# Patient Record
Sex: Male | Born: 1950
Health system: Southern US, Community
[De-identification: ages and names within clinical notes are randomized; demographics above are authoritative.]

## PROBLEM LIST (undated history)

## (undated) DIAGNOSIS — R05 Cough: Secondary | ICD-10-CM

## (undated) DIAGNOSIS — M79662 Pain in left lower leg: Secondary | ICD-10-CM

## (undated) DIAGNOSIS — E119 Type 2 diabetes mellitus without complications: Secondary | ICD-10-CM

## (undated) DIAGNOSIS — C9000 Multiple myeloma not having achieved remission: Principal | ICD-10-CM

## (undated) DIAGNOSIS — C801 Malignant (primary) neoplasm, unspecified: Secondary | ICD-10-CM

## (undated) DIAGNOSIS — K219 Gastro-esophageal reflux disease without esophagitis: Secondary | ICD-10-CM

## (undated) DIAGNOSIS — M79661 Pain in right lower leg: Secondary | ICD-10-CM

## (undated) HISTORY — DX: Pain in right lower leg: M79.661

## (undated) HISTORY — DX: Cough: R05

## (undated) HISTORY — DX: Multiple myeloma not having achieved remission: C90.00

## (undated) HISTORY — DX: Pain in left lower leg: M79.662

## (undated) HISTORY — PX: ORIF ULNAR FRACTURE: SHX5417

## (undated) HISTORY — PX: OTHER SURGICAL HISTORY: SHX169

---

## 1997-11-23 ENCOUNTER — Ambulatory Visit (HOSPITAL_COMMUNITY): Admission: RE | Admit: 1997-11-23 | Discharge: 1997-11-23 | Payer: Self-pay | Admitting: Gastroenterology

## 1998-02-15 ENCOUNTER — Emergency Department (HOSPITAL_COMMUNITY): Admission: EM | Admit: 1998-02-15 | Discharge: 1998-02-15 | Payer: Self-pay | Admitting: Emergency Medicine

## 2000-11-29 ENCOUNTER — Ambulatory Visit (HOSPITAL_COMMUNITY): Admission: RE | Admit: 2000-11-29 | Discharge: 2000-11-29 | Payer: Self-pay | Admitting: Gastroenterology

## 2000-11-29 ENCOUNTER — Encounter (INDEPENDENT_AMBULATORY_CARE_PROVIDER_SITE_OTHER): Payer: Self-pay | Admitting: *Deleted

## 2002-03-23 DIAGNOSIS — C801 Malignant (primary) neoplasm, unspecified: Secondary | ICD-10-CM

## 2002-03-23 HISTORY — DX: Malignant (primary) neoplasm, unspecified: C80.1

## 2002-03-23 HISTORY — PX: TESTICLE REMOVAL: SHX68

## 2003-01-10 ENCOUNTER — Encounter (INDEPENDENT_AMBULATORY_CARE_PROVIDER_SITE_OTHER): Payer: Self-pay | Admitting: Specialist

## 2003-01-10 ENCOUNTER — Ambulatory Visit (HOSPITAL_COMMUNITY): Admission: RE | Admit: 2003-01-10 | Discharge: 2003-01-10 | Payer: Self-pay | Admitting: Urology

## 2003-01-29 ENCOUNTER — Ambulatory Visit: Admission: RE | Admit: 2003-01-29 | Discharge: 2003-03-08 | Payer: Self-pay | Admitting: Radiation Oncology

## 2003-04-05 ENCOUNTER — Ambulatory Visit: Admission: RE | Admit: 2003-04-05 | Discharge: 2003-04-05 | Payer: Self-pay | Admitting: Radiation Oncology

## 2003-04-25 ENCOUNTER — Ambulatory Visit (HOSPITAL_COMMUNITY): Admission: RE | Admit: 2003-04-25 | Discharge: 2003-04-25 | Payer: Self-pay | Admitting: Urology

## 2003-08-07 ENCOUNTER — Ambulatory Visit (HOSPITAL_COMMUNITY): Admission: RE | Admit: 2003-08-07 | Discharge: 2003-08-07 | Payer: Self-pay | Admitting: Urology

## 2003-09-19 ENCOUNTER — Ambulatory Visit (HOSPITAL_COMMUNITY): Admission: RE | Admit: 2003-09-19 | Discharge: 2003-09-19 | Payer: Self-pay | Admitting: Gastroenterology

## 2003-09-19 ENCOUNTER — Encounter (INDEPENDENT_AMBULATORY_CARE_PROVIDER_SITE_OTHER): Payer: Self-pay | Admitting: Specialist

## 2003-12-11 ENCOUNTER — Ambulatory Visit (HOSPITAL_COMMUNITY): Admission: RE | Admit: 2003-12-11 | Discharge: 2003-12-11 | Payer: Self-pay | Admitting: Urology

## 2004-04-09 ENCOUNTER — Ambulatory Visit (HOSPITAL_COMMUNITY): Admission: RE | Admit: 2004-04-09 | Discharge: 2004-04-09 | Payer: Self-pay | Admitting: Urology

## 2005-01-22 ENCOUNTER — Ambulatory Visit (HOSPITAL_COMMUNITY): Admission: RE | Admit: 2005-01-22 | Discharge: 2005-01-22 | Payer: Self-pay | Admitting: Urology

## 2005-11-13 ENCOUNTER — Ambulatory Visit (HOSPITAL_COMMUNITY): Admission: RE | Admit: 2005-11-13 | Discharge: 2005-11-13 | Payer: Self-pay | Admitting: Urology

## 2006-08-06 ENCOUNTER — Ambulatory Visit: Admission: RE | Admit: 2006-08-06 | Discharge: 2006-08-06 | Payer: Self-pay | Admitting: Urology

## 2007-11-29 ENCOUNTER — Ambulatory Visit (HOSPITAL_COMMUNITY): Admission: RE | Admit: 2007-11-29 | Discharge: 2007-11-29 | Payer: Self-pay | Admitting: Urology

## 2010-08-08 NOTE — Op Note (Signed)
NAME:  Tyler Willis, Tyler Willis NO.:  [id number]   MEDICAL RECORD NO.:  [id number]                   PATIENT TYPE:  AMB   LOCATION:  DAY                                  FACILITY:  [facility]   PHYSICIAN:  Valetta Fuller, M.D.               DATE OF BIRTH:  1950/11/17   DATE OF PROCEDURE:  01/10/2003  DATE OF DISCHARGE:                                 OPERATIVE REPORT   PREOPERATIVE DIAGNOSIS:  Right testicular mass.   POSTOPERATIVE DIAGNOSIS:  Right testicular mass.   PROCEDURE:  Right inguinal exploration, right radical orchiectomy.   SURGEON:  Valetta Fuller, M.D.   ASSISTANT:  Susanne Borders, MD   ANESTHESIA:  General endotracheal plus local wound injection with 0.25%  Marcaine.   SPECIMENS:  Right testicle and right spermatic cord for pathologic analysis.   COMPLICATIONS:  None.   ESTIMATED BLOOD LOSS:  10 mL   DISPOSITION:  To post anesthesia care unit in stable condition.   INDICATIONS FOR PROCEDURE:  Tyler Willis is a 60 year old male who was seen  recently by Dr. Isabel Caprice for recent right testicular tenderness/firmness. The  patient reported no injury of trauma to that area. On physical exam, the  patient was found to have a very firm mass in his right testicle. 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 There was no normal testicular consistency as if the testicle had  been replaced entirely by this mass. The patient had tumor markers, AFP,  LFP, and beta HCG which were all within normal limits. Because of the nature  of this intratesticular lesion, the patient was counselled to undergo right  inguinal exploration with probable right radical orchiectomy. The patient  had consented to this after understanding the risks, benefits, and  alternatives.   DESCRIPTION OF PROCEDURE:  The patient was brought to the operating room,  correctly identified by his identification bracelet. He was given  preoperative antibiotics and general endotracheal anesthesia. He was placed  in supine position and shaved, prepped and draped in typical sterile  fashion. Approximately 6 cm inguinal incision was made with the scalpel. The  Bovie electrocautery was used to incise the dermis through the Camper's  fascia down through Scarpa's fascia. Blunt dissection was used to define the  shelving edge of the external oblique aponeuroses. A Kitner was used to free  the fatty tissue off of the external ring. The external ring was incised  approximately 4 cm exposing the entire length of the cord to the level of  the deep inguinal ring. The sides of the external oblique aponeurosis were  lifted and blunt dissection with the Kitner was used to free the spermatic  cord from the floor of the inguinal canal. The ilioinguinal nerve was  identified and was pushed away from the area  of dissection to avoid injury  to it. A right angle was passed around the spermatic cord prior to  manipulation of the testicle. A Penrose was doubly wrapped around the cord  and pulled taut and clamped with a hemostat. Blunt dissection was then used  with a sponge to free the gubernacular attachments of the testicle into the  scrotum. The testicle was pulled up into the inguinal wound without  difficulty. The scrotal dartos tissue was inverted and inspected for  bleeding and none was found. The spermatic cord was clamped twice with Kelly  clamps at the level of the deep inguinal ring. Another clamp was placed  distally and the spermatic cord was incised with Metzenbaum scissors and the  testicle was passed off the table. The clamp was then suture ligated with a  0 silk ligature so the Kelly clamp was flushed and no bleeding was seen.  Another free 2-0 silk tie was placed around the spermatic cord. The clamps  were removed and no bleeding was seen. The ties were purposely  kept long in  the event that the patient will need a retroperitoneal lymph node dissection  some time in the future. The wound was then copiously irrigated and further  inspected for sites of bleeding and none were found. The external oblique  aponeurosis was closed with a 2-0 Vicryl in running fashion. Again care was  taken not to injure the ilioinguinal nerve during closure or entrap it.  Scarpa's fascia was then approximated with a 3-0 Vicryl in a running  fashion. The skin was then closed with a running 4-0 Vicryl in a  subcuticular fashion. Benzoin and Steri-Strips were applied to the wound as  well as a gauze and Telfa. The patient tolerated the surgery well without  complications. He awakened easily from his anesthesia and was taken to the  post anesthesia care unit in stable condition. Please note that Dr. Isabel Caprice  was present and participated in all aspects of this case as he was the  primary surgeon.     Susanne Borders, MD                           Valetta Fuller, M.D.    DR/MEDQ  D:  01/10/2003  T:  01/10/2003  Job:  [id number]

## 2010-08-08 NOTE — Op Note (Signed)
NAME:  Tyler Willis, Tyler Willis NO.:  [id number]   MEDICAL RECORD NO.:  [id number]                   PATIENT TYPE:  AMB   LOCATION:  ENDO                                 FACILITY:  [facility]   PHYSICIAN:  Petra Kuba, M.D.                 DATE OF BIRTH:  11/16/1950   DATE OF PROCEDURE:  09/19/2003  DATE OF DISCHARGE:                                 OPERATIVE REPORT   PROCEDURE:  Esophagogastroduodenoscopy with biopsy.   INDICATION:  Episodic long-term upper tract symptoms.   Consent was signed prior to any pre-meds given.   ADDITIONAL MEDICINES FOR THIS PROCEDURE INCLUDED:  2.0 Versed only.   PROCEDURE:  The video endoscope inserted by direct vision.  The proximal mid  esophagus was normal.  In the distal esophagus there was a few scattered  tiny erosions which were cold biopsied at the end of the procedure.  The  scope passed through a small hiatal hernia into the stomach and advanced  through a normal antrum, normal pylorus into the duodenal bulb, pertinent  for some minimal bulbitis, and around the __________ normal second portion  of the duodenum.  The scope was withdrawn back to the bulb and a good look  there ruled out ulcer in that location.  The scope was withdrawn back to the  stomach and retroflexed.  Angularis, cardia and fundus were normal except  for the hiatal hernia being confirmed in the cardia.  Straight visualization  in the stomach did not reveal any additional findings.  The scope was then  slowly withdrawn.  The distal esophageal biopsies of the few tiny erosions  were obtained.  The scope was further withdrawn.  Mid and proximal esophagus  was normal.  The scope was removed.  Patient tolerated the procedure well.  There was no obvious immediate complication.   ENDOSCOPIC DIAGNOSES:  1. Small hiatal hernia.  2. A few distal esophageal erosions status post biopsy.  3. Minimal bulbitis.  4. Otherwise normal esophagogastroduodenoscopy.   PLAN:  1. Await pathology.  2. Okay for over-the-counter Prilosec use.  3. Pump inhibitor prescription p.r.n.  4. Happy to see back p.r.n., otherwise return care to Dr. Clarene Duke for the     customary health __________ to include yearly rectals and guaiacs.                                               Petra Kuba, M.D.    MEM/MEDQ  D:  09/19/2003  T:  09/19/2003  Job:  [id number]   cc:   Caryn Bee L. Little, M.D.  [address]  [place]  [place] [address]  Fax: [phone]

## 2010-08-08 NOTE — Procedures (Signed)
[place]  Patient:    Tyler Willis, Tyler Willis Visit Number: [id number] MRN: [id number]          Service Type: END Location: ENDO Attending Physician:  Nelda Marseille Dictated by:   Petra Kuba, M.D. Proc. Date: 11/29/00 Admit Date:  11/29/2000   CC:         Caryn Bee L. Little, M.D.   Procedure Report  PROCEDURE:  Colonoscopy with polypectomy.  ENDOSCOPIST:  Petra Kuba, M.D.  INDICATIONS:  Patient with a history of colon polyps due for repeat screening. Consent was signed after risks, benefits, methods, and options were thoroughly discussed multiple times in the past.  MEDICINES USED:  Demerol 50, Versed 5.  DESCRIPTION OF PROCEDURE:  Rectal inspection was pertinent for external hemorrhoids.  Digital exam was negative.  Video colonoscope was inserted and easily advanced around the colon to the cecum.  This did require some abdominal pressure and rolling him on his back.  No obvious abnormality was seen on insertion.  The cecum was identified by the appendiceal orifice and the ileocecal valve.  The prep was adequate.  There was some liquid stool that required washing and suctioning.  On slow withdrawal through the colon, the cecum, ascending, and transverse were all normal.  As the scope was withdrawn around the splenic flexure, a small 3 mm polyp was seen, snared, electrocautery applied, and the polyp was suctioned through the scope and collected in the trap.  The scope was further withdrawn.  There were some very early diverticular changes in the sigmoid.  In the distal sigmoid, a tiny polyp was seen, was hot biopsied x 1.  Back in the rectum, another tiny polyp was seen and was hot biopsied x 1 as well.  The scope was then retroflexed, pertinent for some internal hemorrhoids.  All polyp pathology was put in the same container.   The scope was then straightened and readvanced a short ways up the sigmoid.  Air was suctioned and scope  removed.  The patient tolerated the procedure well.  There was no obvious immediate complication.  ENDOSCOPIC DIAGNOSIS: 1. Internal and external hemorrhoids, small. 2. Rectal distal sigmoid, two tiny polyps hot biopsied. 3. Descending small polyp status post snare. 4. Questionable early left-sided diverticulum. 5. Otherwise within normal limits to the cecum.  PLAN:  Await pathology but probably recheck colon screening in four to five years.  GI followup p.r.n., otherwise return care to Dr. Clarene Duke for the customary health care maintenance to include yearly rectals and guaiacs.  Will put him on the customary one-week post polypectomy instructions. Dictated by:   Petra Kuba, M.D. Attending Physician:  Nelda Marseille DD:  11/29/00 TD:  11/29/00 Job: [id number]

## 2010-08-08 NOTE — Op Note (Signed)
NAME:  Tyler Willis, Tyler Willis NO.:  [id number]   MEDICAL RECORD NO.:  [id number]                   PATIENT TYPE:  AMB   LOCATION:  ENDO                                 FACILITY:  [facility]   PHYSICIAN:  Petra Kuba, M.D.                 DATE OF BIRTH:  01/07/1951   DATE OF PROCEDURE:  09/19/2003  DATE OF DISCHARGE:                                 OPERATIVE REPORT   PROCEDURE:  Colonoscopy.   INDICATIONS:  Bright red blood per rectum.  History of colon polyps almost  due to colonic screening.  Consent was signed after risks, benefits,  methods, and options were thoroughly discussed in the office.   PREMEDICATIONS:  Demerol 80 mg, Versed 8 mg.   DESCRIPTION OF PROCEDURE:  Rectal inspection pertinent for external  hemorrhoids.  Digital exam was negative.  Video colonoscope was inserted and  easily advanced to the level of the ileocecal valve.  To advance to the  cecum required abdominal pressure and rolling on his back.  No  abnormalities, but left-sided diverticula were seen.  The scope was inserted  a short ways in the terminal ileum which was normal.  Photo documentation  was obtained.  The scope was slowly withdrawn.  Prep was adequate.  There  was some liquid stool that required washing and suctioning.  On slow  withdrawal through the colon, the right side was normal.  On the left side,  there was an occasional scattered diverticula but no masses, polyps, or  signs of bleeding.  Once back in the rectum, anal rectal pull-through and  retroflexion confirmed some hemorrhoids.  The scope was reinserted a short  ways up the left side of colon.  Air was suctioned and the scope removed.  The patient tolerated the procedure well.  There were no obvious immediate  complications.   ENDOSCOPIC DIAGNOSES:  1. Internal and external hemorrhoids.  2. Left-sided diverticula.  3. Otherwise within normal limits to the terminal ileum.   PLAN:  Recheck colon screening in  five years.  Continue workup with a repeat  EGD.  Please see that dictation for further recommendations.   Anorectal pull-through                                               Petra Kuba, M.D.    MEM/MEDQ  D:  09/19/2003  T:  09/19/2003  Job:  [id number]   cc:   Caryn Bee L. Little, M.D.  [address]  [place]  [place] [address]  Fax: [phone]   Valetta Fuller, M.D.  [address]  [place]  [place] [address]  Fax: [phone]

## 2011-03-06 ENCOUNTER — Other Ambulatory Visit: Payer: Self-pay | Admitting: Family Medicine

## 2011-03-06 DIAGNOSIS — R1031 Right lower quadrant pain: Secondary | ICD-10-CM

## 2011-03-06 DIAGNOSIS — D72829 Elevated white blood cell count, unspecified: Secondary | ICD-10-CM

## 2011-03-10 ENCOUNTER — Ambulatory Visit
Admission: RE | Admit: 2011-03-10 | Discharge: 2011-03-10 | Disposition: A | Discharge status: Home / Self Care | Payer: BC Managed Care – PPO | Source: Ambulatory Visit | Attending: Family Medicine | Admitting: Family Medicine

## 2011-03-10 DIAGNOSIS — R1031 Right lower quadrant pain: Secondary | ICD-10-CM

## 2011-03-10 DIAGNOSIS — D72829 Elevated white blood cell count, unspecified: Secondary | ICD-10-CM

## 2011-03-10 MED ORDER — IOHEXOL 300 MG/ML  SOLN
100.0000 mL | Freq: Once | INTRAMUSCULAR | Status: AC | PRN
  Administered 2011-03-10: 100 mL via INTRAVENOUS

## 2011-12-17 ENCOUNTER — Other Ambulatory Visit: Payer: Self-pay | Admitting: Dermatology

## 2012-02-17 ENCOUNTER — Encounter (HOSPITAL_BASED_OUTPATIENT_CLINIC_OR_DEPARTMENT_OTHER): Payer: Self-pay | Admitting: *Deleted

## 2012-02-17 ENCOUNTER — Emergency Department (HOSPITAL_BASED_OUTPATIENT_CLINIC_OR_DEPARTMENT_OTHER)
Admission: EM | Admit: 2012-02-17 | Discharge: 2012-02-17 | Disposition: A | Discharge status: Home / Self Care | Payer: BC Managed Care – PPO | Attending: Emergency Medicine | Admitting: Emergency Medicine

## 2012-02-17 DIAGNOSIS — Z79899 Other long term (current) drug therapy: Secondary | ICD-10-CM | POA: Insufficient documentation

## 2012-02-17 DIAGNOSIS — E1169 Type 2 diabetes mellitus with other specified complication: Secondary | ICD-10-CM | POA: Insufficient documentation

## 2012-02-17 DIAGNOSIS — R350 Frequency of micturition: Secondary | ICD-10-CM | POA: Insufficient documentation

## 2012-02-17 DIAGNOSIS — R5381 Other malaise: Secondary | ICD-10-CM | POA: Insufficient documentation

## 2012-02-17 DIAGNOSIS — K219 Gastro-esophageal reflux disease without esophagitis: Secondary | ICD-10-CM | POA: Insufficient documentation

## 2012-02-17 DIAGNOSIS — E119 Type 2 diabetes mellitus without complications: Secondary | ICD-10-CM

## 2012-02-17 HISTORY — DX: Gastro-esophageal reflux disease without esophagitis: K21.9

## 2012-02-17 HISTORY — DX: Type 2 diabetes mellitus without complications: E11.9

## 2012-02-17 LAB — BASIC METABOLIC PANEL
BUN: 14 mg/dL (ref 6–23)
CO2: 25 mEq/L (ref 19–32)
Calcium: 9.6 mg/dL (ref 8.4–10.5)
Chloride: 90 mEq/L — ABNORMAL LOW (ref 96–112)
GFR calc Af Amer: 83 mL/min — ABNORMAL LOW (ref >90)
GFR calc non Af Amer: 72 mL/min — ABNORMAL LOW (ref >90)
Potassium: 4.3 mEq/L (ref 3.5–5.1)
Sodium: 125 mEq/L — ABNORMAL LOW (ref 135–145)

## 2012-02-17 LAB — URINALYSIS, ROUTINE W REFLEX MICROSCOPIC
Bilirubin Urine: NEGATIVE
Glucose, UA: >1000 mg/dL — AB
Ketones, ur: NEGATIVE mg/dL
Leukocytes, UA: NEGATIVE
Nitrite: NEGATIVE
Protein, ur: NEGATIVE mg/dL
Urobilinogen, UA: 0.2 mg/dL (ref 0.0–1.0)

## 2012-02-17 LAB — GLUCOSE, CAPILLARY: Glucose-Capillary: 516 mg/dL — ABNORMAL HIGH (ref 70–99)

## 2012-02-17 LAB — URINE MICROSCOPIC-ADD ON

## 2012-02-17 MED ORDER — INSULIN REGULAR HUMAN 100 UNIT/ML IJ SOLN
10.0000 [IU] | Freq: Once | INTRAMUSCULAR | Status: AC
  Administered 2012-02-17: 10 [IU] via SUBCUTANEOUS

## 2012-02-17 MED ORDER — SODIUM CHLORIDE 0.9 % IV BOLUS (SEPSIS)
1000.0000 mL | Freq: Once | INTRAVENOUS | Status: AC
  Administered 2012-02-17: 1000 mL via INTRAVENOUS

## 2012-02-17 MED ORDER — INSULIN REGULAR HUMAN 100 UNIT/ML IJ SOLN
INTRAMUSCULAR | Status: AC
  Administered 2012-02-17: 10 [IU] via SUBCUTANEOUS
  Filled 2012-02-17: qty 1

## 2012-02-17 NOTE — ED Notes (Signed)
MD at bedside. 

## 2012-02-17 NOTE — ED Provider Notes (Signed)
Medical screening examination/treatment/procedure(s) were performed by non-physician practitioner and as supervising physician I was immediately available for consultation/collaboration.  Skipper Dacosta, MD 02/17/12 2041 

## 2012-02-17 NOTE — ED Provider Notes (Signed)
History     CSN: [id number]  Arrival date & time 02/17/12  1634   First MD Initiated Contact with Patient 02/17/12 1645      Chief Complaint  Patient presents with  . Hyperglycemia    (Consider location/radiation/quality/duration/timing/severity/associated sxs/prior treatment) HPI Comments: Pt states that he was diagnosed with diabetes 1 week ago and pt was put on lantus and metformin:pt states that he has been in contact with his doctor and they changed his lantus to 15 units at night and he is still having bs in the 400 to 600 range:pt is continuing to have frequent urination, dry mouth and muscle cramps:wife states that they came in because they couldn't get into the doctor until next week and she felt like his medications should be responding faster:denies blurred vision, confusion, fever, n/v/d  The history is provided by the patient. No language interpreter was used.    Past Medical History  Diagnosis Date  . Diabetes mellitus without complication   . GERD (gastroesophageal reflux disease)     History reviewed. No pertinent past surgical history.  History reviewed. No pertinent family history.  History  Substance Use Topics  . Smoking status: Not on file  . Smokeless tobacco: Not on file  . Alcohol Use:       Review of Systems  Constitutional: Positive for fatigue.  Respiratory: Negative.   Cardiovascular: Negative.   Neurological: Negative for dizziness.    Allergies  Review of patient's allergies indicates no known allergies.  Home Medications   Current Outpatient Rx  Name  Route  Sig  Dispense  Refill  . METFORMIN HCL 500 MG PO TABS   Oral   Take 500 mg by mouth 2 (two) times daily with a meal.         . OMEPRAZOLE 20 MG PO CPDR   Oral   Take 20 mg by mouth daily.           BP 130/74  Pulse 75  Temp 98.2 F (36.8 C) (Oral)  Resp 20  Ht 6' (1.829 m)  Wt 193 lb (87.544 kg)  BMI 26.18 kg/m2  SpO2 95%  Physical Exam  Nursing note and  vitals reviewed. Constitutional: He is oriented to person, place, and time. He appears well-developed and well-nourished.  HENT:  Head: Normocephalic and atraumatic.  Eyes: Conjunctivae normal and EOM are normal.  Neck: Neck supple.  Cardiovascular: Normal rate and regular rhythm.   Pulmonary/Chest: Effort normal and breath sounds normal.  Abdominal: Soft. Bowel sounds are normal. There is no tenderness.  Musculoskeletal: Normal range of motion.  Neurological: He is alert and oriented to person, place, and time.  Skin: Skin is warm and dry.  Psychiatric: He has a normal mood and affect.    ED Course  Procedures (including critical care time)  Labs Reviewed  GLUCOSE, CAPILLARY - Abnormal; Notable for the following:    Glucose-Capillary 516 (*)     All other components within normal limits  BASIC METABOLIC PANEL - Abnormal; Notable for the following:    Sodium 125 (*)     Chloride 90 (*)     Glucose, Bld 474 (*)     GFR calc non Af Amer 72 (*)     GFR calc Af Amer 83 (*)     All other components within normal limits  URINALYSIS, ROUTINE W REFLEX MICROSCOPIC - Abnormal; Notable for the following:    Glucose, UA >1000 (*)     Hgb urine dipstick TRACE (*)  All other components within normal limits  GLUCOSE, CAPILLARY - Abnormal; Notable for the following:    Glucose-Capillary 333 (*)     All other components within normal limits  URINE MICROSCOPIC-ADD ON   No results found.   1. Diabetes       MDM  Pt not in dka, anion gap 10:had long discussion with pt and wife about the correct foods to eat:pt just changed lantus dose last night so i don't want to change anything else at this time especially because pt now has a better understanding of the correct foods and don't want the pt to get hypoglycemic        Teressa Lower, NP 02/17/12 2011

## 2012-02-17 NOTE — ED Notes (Signed)
Pt amb to triage with quick steady gait smiling in nad. Pt reports his fsbs have been high, yesterday over 600, today over 400. Pt states he has communicated with his pcp who has adjusted his insulin over the phone, but could not see him until next week. Pt states he feels he needs to be seen today for eval.

## 2013-03-29 ENCOUNTER — Other Ambulatory Visit: Payer: Self-pay | Admitting: Family Medicine

## 2013-03-29 DIAGNOSIS — R071 Chest pain on breathing: Secondary | ICD-10-CM

## 2013-03-31 ENCOUNTER — Ambulatory Visit
Admission: RE | Admit: 2013-03-31 | Discharge: 2013-03-31 | Disposition: A | Discharge status: Home / Self Care | Payer: BC Managed Care – PPO | Source: Ambulatory Visit | Attending: Family Medicine | Admitting: Family Medicine

## 2013-03-31 DIAGNOSIS — R071 Chest pain on breathing: Secondary | ICD-10-CM

## 2013-03-31 MED ORDER — IOHEXOL 300 MG/ML  SOLN
75.0000 mL | Freq: Once | INTRAMUSCULAR | Status: AC | PRN
  Administered 2013-03-31: 75 mL via INTRAVENOUS

## 2014-01-31 ENCOUNTER — Other Ambulatory Visit (HOSPITAL_BASED_OUTPATIENT_CLINIC_OR_DEPARTMENT_OTHER): Payer: Self-pay | Admitting: Family Medicine

## 2014-01-31 DIAGNOSIS — R6884 Jaw pain: Secondary | ICD-10-CM

## 2014-01-31 DIAGNOSIS — R9389 Abnormal findings on diagnostic imaging of other specified body structures: Secondary | ICD-10-CM

## 2014-02-01 ENCOUNTER — Encounter (HOSPITAL_BASED_OUTPATIENT_CLINIC_OR_DEPARTMENT_OTHER): Payer: Self-pay

## 2014-02-01 ENCOUNTER — Ambulatory Visit (HOSPITAL_BASED_OUTPATIENT_CLINIC_OR_DEPARTMENT_OTHER)
Admission: RE | Admit: 2014-02-01 | Discharge: 2014-02-01 | Disposition: A | Discharge status: Home / Self Care | Payer: BC Managed Care – PPO | Source: Ambulatory Visit | Attending: Family Medicine | Admitting: Family Medicine

## 2014-02-01 ENCOUNTER — Other Ambulatory Visit (HOSPITAL_BASED_OUTPATIENT_CLINIC_OR_DEPARTMENT_OTHER): Payer: Self-pay | Admitting: Family Medicine

## 2014-02-01 DIAGNOSIS — M899 Disorder of bone, unspecified: Secondary | ICD-10-CM | POA: Diagnosis not present

## 2014-02-01 DIAGNOSIS — R51 Headache: Secondary | ICD-10-CM | POA: Diagnosis present

## 2014-02-01 DIAGNOSIS — R9389 Abnormal findings on diagnostic imaging of other specified body structures: Secondary | ICD-10-CM

## 2014-02-01 DIAGNOSIS — R930 Abnormal findings on diagnostic imaging of skull and head, not elsewhere classified: Secondary | ICD-10-CM | POA: Diagnosis present

## 2014-02-01 DIAGNOSIS — R6884 Jaw pain: Secondary | ICD-10-CM

## 2014-02-01 HISTORY — DX: Malignant (primary) neoplasm, unspecified: C80.1

## 2014-02-01 MED ORDER — IOHEXOL 300 MG/ML  SOLN
75.0000 mL | Freq: Once | INTRAMUSCULAR | Status: AC | PRN
  Administered 2014-02-01: 75 mL via INTRAVENOUS

## 2014-02-02 ENCOUNTER — Encounter (HOSPITAL_BASED_OUTPATIENT_CLINIC_OR_DEPARTMENT_OTHER): Payer: Self-pay

## 2014-02-02 ENCOUNTER — Telehealth: Payer: Self-pay | Admitting: Hematology

## 2014-02-02 ENCOUNTER — Ambulatory Visit (HOSPITAL_BASED_OUTPATIENT_CLINIC_OR_DEPARTMENT_OTHER)
Admission: RE | Admit: 2014-02-02 | Discharge: 2014-02-02 | Disposition: A | Discharge status: Home / Self Care | Payer: BC Managed Care – PPO | Source: Ambulatory Visit | Attending: Family Medicine | Admitting: Family Medicine

## 2014-02-02 DIAGNOSIS — M899 Disorder of bone, unspecified: Secondary | ICD-10-CM

## 2014-02-02 DIAGNOSIS — Z8547 Personal history of malignant neoplasm of testis: Secondary | ICD-10-CM | POA: Insufficient documentation

## 2014-02-02 MED ORDER — IOHEXOL 300 MG/ML  SOLN
100.0000 mL | Freq: Once | INTRAMUSCULAR | Status: AC | PRN
  Administered 2014-02-02: 100 mL via INTRAVENOUS

## 2014-02-02 NOTE — Telephone Encounter (Signed)
S/W PATIENT AND GAVE NP APPT FOR 11/17 @ 9 W/DR. FENG.  REFERRAL DR. BARNES DX- LYTIC LESIONS ON CT REFERRAL INFORMATION SCANNED UNDER THE MEDIA TAB

## 2014-02-06 ENCOUNTER — Ambulatory Visit (HOSPITAL_BASED_OUTPATIENT_CLINIC_OR_DEPARTMENT_OTHER): Payer: BC Managed Care – PPO

## 2014-02-06 ENCOUNTER — Ambulatory Visit (HOSPITAL_BASED_OUTPATIENT_CLINIC_OR_DEPARTMENT_OTHER): Payer: BC Managed Care – PPO | Admitting: Hematology

## 2014-02-06 ENCOUNTER — Encounter (INDEPENDENT_AMBULATORY_CARE_PROVIDER_SITE_OTHER): Payer: Self-pay

## 2014-02-06 ENCOUNTER — Ambulatory Visit: Payer: BC Managed Care – PPO

## 2014-02-06 ENCOUNTER — Telehealth: Payer: Self-pay | Admitting: Hematology

## 2014-02-06 ENCOUNTER — Encounter: Payer: Self-pay | Admitting: Hematology

## 2014-02-06 ENCOUNTER — Other Ambulatory Visit: Payer: Self-pay | Admitting: *Deleted

## 2014-02-06 VITALS — BP 141/77 | HR 78 | Temp 98.4°F | Resp 18 | Ht 72.0 in | Wt 198.0 lb

## 2014-02-06 DIAGNOSIS — K5730 Diverticulosis of large intestine without perforation or abscess without bleeding: Secondary | ICD-10-CM

## 2014-02-06 DIAGNOSIS — M899 Disorder of bone, unspecified: Secondary | ICD-10-CM

## 2014-02-06 DIAGNOSIS — Z8546 Personal history of malignant neoplasm of prostate: Secondary | ICD-10-CM

## 2014-02-06 DIAGNOSIS — M898X8 Other specified disorders of bone, other site: Secondary | ICD-10-CM

## 2014-02-06 DIAGNOSIS — R05 Cough: Secondary | ICD-10-CM

## 2014-02-06 DIAGNOSIS — K5790 Diverticulosis of intestine, part unspecified, without perforation or abscess without bleeding: Secondary | ICD-10-CM | POA: Insufficient documentation

## 2014-02-06 LAB — PROTIME-INR
INR: 1 — ABNORMAL LOW (ref 2.00–3.50)
Protime: 12 Seconds (ref 10.6–13.4)

## 2014-02-06 MED ORDER — GUAIFENESIN-CODEINE 100-10 MG/5ML PO SOLN: 5.0000 mL | Freq: Four times a day (QID) | ORAL | Status: DC | PRN

## 2014-02-06 MED ORDER — OXYCODONE-ACETAMINOPHEN 5-325 MG PO TABS: 1.0000 | ORAL_TABLET | ORAL | Status: DC | PRN

## 2014-02-06 NOTE — Progress Notes (Signed)
[facility] CONSULT NOTE  Patient Care Team: Hulan Fess, MD as PCP - General (Family Medicine)  CHIEF COMPLAINTS/PURPOSE OF CONSULTATION:  Multifocal lytic bone lesions   HISTORY OF PRESENTING ILLNESS:  Tyler Willis 63 y.o. male is here because of newly discussed discovered multifocal lytic bone lesions on scans.  He has noticed left side gum pain when he eats for the past two weeks. He also noticed some sternal pain and the bilateral rib pains when he coughs or lays down on his sides. He has mild nonproductive cough, no hemoptysis or dyspnea on exertion. He went to see his dentist a few weeks ago and had a x-ray of his teeth, which showed multiple lytic bone lesions on both sites maxilla. His primary care physician ordered CT of chest abdomen and pelvis which reviewed multiple lytic bone lesions involving sternum and bilateral ribs especially the ninth and 10th ribs. His CBC was unremarkable. According to his wife is primary care physician possible to the SPEP which was negative, but the report was not available to me during his visit.  He denies any recent fever, night sweats, or weight loss. No nausea, vomiting, no change of his BM. No black stool for bloody stool. He has urinary frequency due to his BPH, but that this has not changed lately.   He had history of right testicular cancer i 2004. This was treated with right orchiectomy and chemotherapy. He has been following up with his urologist and no evidence of recurrence.    MEDICAL HISTORY:  Past Medical History  Diagnosis Date  . Diabetes mellitus without complication   . GERD (gastroesophageal reflux disease)   . Cancer 2004    testicular    SURGICAL HISTORY: History reviewed. No pertinent past surgical history.  SOCIAL HISTORY: History   Social History  . Marital Status: Married    Spouse Name: N/A    Number of Children: N/A  . Years of Education: N/A   Occupational History  . Not on file.    Social History Main Topics  . Smoking status: Not on file  . Smokeless tobacco: Not on file  . Alcohol Use: Not on file  . Drug Use: Not on file  . Sexual Activity: Not on file   Other Topics Concern  . Not on file   Social History Narrative    FAMILY HISTORY: History reviewed. No pertinent family history.  ALLERGIES:  has No Known Allergies.  MEDICATIONS:  Current Outpatient Prescriptions  Medication Sig Dispense Refill  . BAYER CONTOUR NEXT TEST test strip   5  . insulin glargine (LANTUS) 100 UNIT/ML injection Inject 5 Units into the skin at bedtime.    . Magnesium 400 MG TABS Take 400 mg by mouth daily.    . metFORMIN (GLUCOPHAGE) 500 MG tablet Take 500 mg by mouth 2 (two) times daily with a meal.    . Multiple Vitamin (MULTIVITAMIN) tablet Take 1 tablet by mouth daily.    . naproxen sodium (ANAPROX) 220 MG tablet Take 220 mg by mouth as needed.    Marland Kitchen omeprazole (PRILOSEC) 20 MG capsule Take 20 mg by mouth daily.    . pravastatin (PRAVACHOL) 40 MG tablet Take 40 mg by mouth. Daily  1  . psyllium (REGULOID) 0.52 G capsule Take 5 capsules by mouth daily.     No current facility-administered medications for this visit.    REVIEW OF SYSTEMS:   Constitutional: Denies fevers, chills or abnormal night sweats.  Eyes: Denies  blurriness of vision, double vision or watery eyes Ears, nose, mouth, throat, and face: Denies mucositis or sore throat Respiratory: (+) dry cough, no dyspnea or wheezes Cardiovascular: Denies palpitation, chest discomfort or lower extremity swelling Gastrointestinal:  Denies nausea, heartburn or change in bowel habits Skin: Denies abnormal skin rashes Lymphatics: Denies new lymphadenopathy or easy bruising Neurological:Denies numbness, tingling or new weaknesses Behavioral/Psych: Mood is stable, no new changes  All other systems were reviewed with the patient and are negative except those mentioned in H&P.   PHYSICAL EXAMINATION: ECOG PERFORMANCE  STATUS: 1 - Symptomatic but completely ambulatory  Filed Vitals:   02/06/14 0929  BP: 141/77  Pulse: 78  Temp: 98.4 F (36.9 C)  Resp: 18   Filed Weights   02/06/14 0929  Weight: 198 lb (89.812 kg)    GENERAL:alert, no distress and comfortable SKIN: skin color, texture, turgor are normal, no rashes or significant lesions EYES: normal, conjunctiva are pink and non-injected, sclera clear OROPHARYNX:no exudate, no erythema and lips, buccal mucosa, and tongue normal  NECK: supple, thyroid normal size, non-tender, without nodularity LYMPH:  no palpable lymphadenopathy in the cervical, axillary or inguinal LUNGS: clear to auscultation and percussion with normal breathing effort HEART: regular rate & rhythm and no murmurs and no lower extremity edema ABDOMEN:abdomen soft, non-tender and normal bowel sounds Musculoskeletal:no cyanosis of digits and no clubbing. (+) tenderness at sternum and b/l posterial low ribs.  PSYCH: alert & oriented x 3 with fluent speech NEURO: no focal motor/sensory deficits  LABORATORY DATA:  I have reviewed the data as listed in H&P  RADIOGRAPHIC STUDIES: I have personally reviewed the radiological images as listed and agreed with the findings in the report.  Ct Abdomen Pelvis W Contrast  02/02/2014 CT CHEST, ABDOMEN, AND PELVIS WITH CONTRAST   IMPRESSION: Lytic metastases in the sternum and left posterolateral 9th and 10th ribs.  Right lateral 9th and 10th rib fractures, without definite underlying mass, possibly posttraumatic.  These findings remain concerning for metastatic disease or multiple myeloma.  No definite primary neoplasm is visualized.  Postsurgical changes related to prior right orchiectomy.   Electronically Signed   By: Julian Hy M.D.   On: 02/02/2014 11:11     02/01/2014    CT MAXILLOFACIAL WITH CONTRAST   IMPRESSION: Multiple lytic lesions involving the bony structures most prominent in the region of the left maxillary antrum as  described. Differential includes multiple myeloma as well as metastatic disease. Further evaluation with laboratory testing as well as CT of the chest abdomen pelvis with contrast is recommended to rule out an underlying primary neoplasm.  These results will be called to the ordering clinician or representative by the Radiologist Assistant, and communication documented in the PACS or zVision Dashboard.  Electronically Signed: By: Inez Catalina M.D. On: 02/01/2014 10:22    ASSESSMENT & PLAN:  Tyler Willis is a 63 year old male who was found to have diffuse lytic bone lesions with associated pain. He had history of testicular cancer in [facility] for and was treated with into with curative intent. CT of the chest abdomen and pelvis did not reveal discrete mass or adenopathy. This is highly suspicious for multiple myeloma or metastatic cancer. Giving his normal CBC and a negative SPEP (I will obtain his report from his primary care physician), the diagnosis of MM is much less likely also still possible (nonsecretory multiple myeloma ).   I recommend to do a PET scan to further evaluate his bone metastasis and oclut primary.  I recommend a bone biopsy and a bone marrow biopsy. I have reviewed his images with his our interventional radiologist Dr. Kathlene Cote, and we decided to do a bone and bone marrow biopsy of his sternum.  Although his maxillary bone lesion is much bigger, but it probably would require ENT surgeon to perform a open biopsy which is much more invasive.   Meantime I'll check some office tumor markers including PSA, CEA and CA 19.9. Depending on the bone biopsy results, he may need further evaluation for the primary site.   I given him a prescription of Percocet and codeine-containing cough syrup for his pain and cough control. I plan to see him back in 2 weeks to discuss his biopsy results and further management.    All questions were answered. The patient knows to call the clinic with any problems,  questions or concerns. I spent 40 minutes counseling the patient face to face. The total time spent in the appointment was 60 minutes and more than 50% was on counseling.     Truitt Merle, MD 02/06/2014 10:30 AM

## 2014-02-06 NOTE — Telephone Encounter (Signed)
Gave avs & cal for Nov.  °

## 2014-02-06 NOTE — Progress Notes (Signed)
Checked in new patient with no financial issues prior to seeing the dr. He has appt card and had not been out of the country.

## 2014-02-08 ENCOUNTER — Other Ambulatory Visit: Payer: Self-pay | Admitting: Radiology

## 2014-02-09 ENCOUNTER — Other Ambulatory Visit: Payer: Self-pay | Admitting: Radiology

## 2014-02-09 LAB — AFP TUMOR MARKER: AFP-Tumor Marker: 4.7 ng/mL (ref <6.1)

## 2014-02-09 LAB — UPEP/TP, 24-HR URINE
Collection Interval: 24 hours
TOTAL VOLUME, URINE: 3600 mL
Total Protein, Urine: <4 mg/dL

## 2014-02-09 LAB — PSA, TOTAL AND FREE
PSA FREE: 0.61 ng/mL
PSA, Free Pct: 56 % (ref >25)
PSA: 1.08 ng/mL (ref <=4.00)

## 2014-02-09 LAB — BETA HCG QUANT (REF LAB): Beta hCG, Tumor Marker: <2 m[IU]/mL (ref <5.0)

## 2014-02-09 LAB — CEA: CEA: 0.5 ng/mL (ref 0.0–5.0)

## 2014-02-09 LAB — AFP TUMOR MARKER-PREVIOUS METHOD: AFP Tumor Marker: 5.8 ng/mL (ref 0.0–8.0)

## 2014-02-12 ENCOUNTER — Encounter (HOSPITAL_COMMUNITY): Payer: Self-pay

## 2014-02-12 ENCOUNTER — Other Ambulatory Visit: Payer: Self-pay | Admitting: Hematology

## 2014-02-12 ENCOUNTER — Ambulatory Visit (HOSPITAL_COMMUNITY)
Admission: RE | Admit: 2014-02-12 | Discharge: 2014-02-12 | Disposition: A | Discharge status: Home / Self Care | Payer: BC Managed Care – PPO | Source: Ambulatory Visit | Attending: Hematology | Admitting: Hematology

## 2014-02-12 ENCOUNTER — Other Ambulatory Visit (HOSPITAL_COMMUNITY)
Admission: RE | Admit: 2014-02-12 | Discharge: 2014-02-12 | Disposition: A | Discharge status: Home / Self Care | Payer: BC Managed Care – PPO | Source: Ambulatory Visit | Attending: Hematology | Admitting: Hematology

## 2014-02-12 DIAGNOSIS — N281 Cyst of kidney, acquired: Secondary | ICD-10-CM | POA: Insufficient documentation

## 2014-02-12 DIAGNOSIS — Z794 Long term (current) use of insulin: Secondary | ICD-10-CM | POA: Insufficient documentation

## 2014-02-12 DIAGNOSIS — M899 Disorder of bone, unspecified: Secondary | ICD-10-CM

## 2014-02-12 DIAGNOSIS — Z8547 Personal history of malignant neoplasm of testis: Secondary | ICD-10-CM | POA: Diagnosis not present

## 2014-02-12 DIAGNOSIS — K5730 Diverticulosis of large intestine without perforation or abscess without bleeding: Secondary | ICD-10-CM | POA: Diagnosis not present

## 2014-02-12 DIAGNOSIS — C9030 Solitary plasmacytoma not having achieved remission: Secondary | ICD-10-CM | POA: Diagnosis not present

## 2014-02-12 DIAGNOSIS — Z9221 Personal history of antineoplastic chemotherapy: Secondary | ICD-10-CM | POA: Diagnosis not present

## 2014-02-12 DIAGNOSIS — E041 Nontoxic single thyroid nodule: Secondary | ICD-10-CM | POA: Insufficient documentation

## 2014-02-12 DIAGNOSIS — E119 Type 2 diabetes mellitus without complications: Secondary | ICD-10-CM | POA: Diagnosis not present

## 2014-02-12 DIAGNOSIS — Z9079 Acquired absence of other genital organ(s): Secondary | ICD-10-CM | POA: Diagnosis not present

## 2014-02-12 DIAGNOSIS — I2510 Atherosclerotic heart disease of native coronary artery without angina pectoris: Secondary | ICD-10-CM | POA: Diagnosis not present

## 2014-02-12 DIAGNOSIS — Z87891 Personal history of nicotine dependence: Secondary | ICD-10-CM | POA: Insufficient documentation

## 2014-02-12 DIAGNOSIS — Z79899 Other long term (current) drug therapy: Secondary | ICD-10-CM | POA: Insufficient documentation

## 2014-02-12 DIAGNOSIS — K219 Gastro-esophageal reflux disease without esophagitis: Secondary | ICD-10-CM | POA: Insufficient documentation

## 2014-02-12 LAB — CBC
HEMATOCRIT: 39.6 % (ref 39.0–52.0)
Hemoglobin: 13.2 g/dL (ref 13.0–17.0)
MCH: 30.2 pg (ref 26.0–34.0)
MCHC: 33.3 g/dL (ref 30.0–36.0)
MCV: 90.6 fL (ref 78.0–100.0)
Platelets: 188 10*3/uL (ref 150–400)
RBC: 4.37 MIL/uL (ref 4.22–5.81)
RDW: 14.5 % (ref 11.5–15.5)
WBC: 6.2 10*3/uL (ref 4.0–10.5)

## 2014-02-12 LAB — APTT: aPTT: 29 seconds (ref 24–37)

## 2014-02-12 LAB — PROTIME-INR
INR: 0.98 (ref 0.00–1.49)
Prothrombin Time: 13.1 seconds (ref 11.6–15.2)

## 2014-02-12 LAB — BONE MARROW EXAM

## 2014-02-12 MED ORDER — MIDAZOLAM HCL 2 MG/2ML IJ SOLN
INTRAMUSCULAR | Status: AC | PRN
  Administered 2014-02-12: 2 mg via INTRAVENOUS

## 2014-02-12 MED ORDER — SODIUM CHLORIDE 0.9 % IV SOLN: INTRAVENOUS | Status: DC

## 2014-02-12 MED ORDER — MIDAZOLAM HCL 2 MG/2ML IJ SOLN
INTRAMUSCULAR | Status: AC
  Filled 2014-02-12: qty 4

## 2014-02-12 MED ORDER — FENTANYL CITRATE 0.05 MG/ML IJ SOLN
INTRAMUSCULAR | Status: AC
  Filled 2014-02-12: qty 4

## 2014-02-12 MED ORDER — FENTANYL CITRATE 0.05 MG/ML IJ SOLN
INTRAMUSCULAR | Status: AC | PRN
  Administered 2014-02-12: 100 ug via INTRAVENOUS

## 2014-02-12 NOTE — Procedures (Signed)
  Procedure:  CT guided bone marrow and lesion biopsy of sternum Findings:  Aspirate and core biopsy performed at level of lytic lesion of sternum. No complications.

## 2014-02-12 NOTE — Discharge Instructions (Signed)
Bone Marrow Aspiration and Bone Biopsy °Examination of the bone marrow is a valuable test to diagnose blood disorders. A bone marrow biopsy takes a sample of bone and a small amount of fluid and cells from inside the bone. A bone marrow aspiration removes only the marrow. Bone marrow aspiration and bone biopsies are used to stage different disorders of the blood, such as leukemia. Staging will help your caregiver understand how far the disease has progressed.  °The tests are also useful in diagnosing: °· Fever of unknown origin (FUO). °· Bacterial infections and other widespread fungal infections. °· Cancers that have spread (metastasized) to the bone marrow. °· Diseases that are characterized by a deficiency of an enzyme (storage diseases). This includes: °· Niemann-Pick disease. °· Gaucher disease. °PROCEDURE  °Sites used to get samples include:  °· Back of your hip bone (posterior iliac crest). °· Both aspiration and biopsy. °· Front of your hip bone (anterior iliac crest). °· Both aspiration and biopsy. °· Breastbone (sternum). °· Aspiration from your breastbone (done only in adults). This method is rarely used. °When you get a hip bone aspiration: °· You are placed lying on your side with the upper knee brought up and flexed with the lower leg straight. °· The site is prepared, cleaned with an antiseptic scrub, and draped. This keeps the biopsy area clean. °· The skin and the area down to the lining of the bone (periosteum) are made numb with a local anesthetic. °· The bone marrow aspiration needle is inserted. You will feel pressure on your bone. °· Once inside the marrow cavity, a sample of bone marrow is sucked out (aspirated) for pathology slides. °· The material collected for bone marrow slides is processed immediately by a technologist. °· The technician selects the marrow particles to make the slides for pathology. °· The marrow aspiration needle is removed. Then pressure is applied to the site with  gauze until bleeding has stopped. °Following an aspiration, a bone marrow biopsy may be performed as well. The technique for this is very similar. A dressing is then applied.  °RISKS AND COMPLICATIONS °· The main complications of a bone marrow aspiration and biopsy include infection and bleeding. °· Complications are uncommon. The procedure may not be performed in patients with bleeding tendencies. °· A very rare complication from the procedure is injury to the heart during a breastbone (sternal) marrow aspiration. Only bone marrow aspirations are performed in this area. °· Long-lasting pain at the site of the bone marrow aspiration and biopsy is uncommon. °Your caregiver will let you know when you are to get your results and will discuss them with you. You may make an appointment with your caregiver to find out the results. Do not assume everything is normal if you have not heard from your caregiver or the medical facility. It is important for you to follow up on all of your test results. °Document Released: 03/12/2004 Document Revised: 06/01/2011 Document Reviewed: 03/06/2008 °[employer]® Patient Information ©2015 [employer]. This information is not intended to replace advice given to you by your health care provider. Make sure you discuss any questions you have with your health care provider. ° °Bone Marrow Aspiration, Bone Marrow Biopsy °Care After °Read the instructions outlined below and refer to this sheet in the next few weeks. These discharge instructions provide you with general information on caring for yourself after you leave the hospital. Your caregiver may also give you specific instructions. While your treatment has been planned according to the   most current medical practices available, unavoidable complications occasionally occur. If you have any problems or questions after discharge, call your caregiver. FINDING OUT THE RESULTS OF YOUR TEST Not all test results are available during your visit.  If your test results are not back during the visit, make an appointment with your caregiver to find out the results. Do not assume everything is normal if you have not heard from your caregiver or the medical facility. It is important for you to follow up on all of your test results.  HOME CARE INSTRUCTIONS  You have had sedation and may be sleepy or dizzy. Your thinking may not be as clear as usual. For the next 24 hours:  Only take over-the-counter or prescription medicines for pain, discomfort, and or fever as directed by your caregiver.  Do not drink alcohol.  Do not smoke.  Do not drive.  Do not make important legal decisions.  Do not operate heavy machinery.  Do not care for small children by yourself.  Keep your dressing clean and dry. You may replace dressing with a bandage after 24 hours.  You may take a bath or shower after 24 hours.  Use an ice pack for 20 minutes every 2 hours while awake for pain as needed. SEEK MEDICAL CARE IF:   There is redness, swelling, or increasing pain at the biopsy site.  There is pus coming from the biopsy site.  There is drainage from a biopsy site lasting longer than one day.  An unexplained oral temperature above 102 F (38.9 C) develops. SEEK IMMEDIATE MEDICAL CARE IF:   You develop a rash.  You have difficulty breathing.  You develop any reaction or side effects to medications given. Document Released: 09/26/2004 Document Revised: 06/01/2011 Document Reviewed: 03/06/2008 [facility] Patient Information 2015 [place], [place]. This information is not intended to replace advice given to you by your health care provider. Make sure you discuss any questions you have with your health care provider.

## 2014-02-12 NOTE — H&P (Signed)
Chief Complaint: "I am here for a biopsy."  Referring Physician(s): Feng,Yan  History of Present Illness: Tyler Willis is a 63 y.o. male with multifocal lytic bone lesions with c/o bone pain. 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 He has been seen by Dr. Burr Medico and is scheduled today for image guided sternum lesion biopsy and bone marrow biopsy. He denies any chest pain, but does experience sternal pain with coughing, he denies any shortness of breath or palpitations. He denies any active signs of bleeding or excessive bruising. The patient denies any history of sleep apnea or chronic oxygen use. He has previously tolerated sedation without complications.   Past Medical History  Diagnosis Date  . Diabetes mellitus without complication   . GERD (gastroesophageal reflux disease)   . Cancer 2004    testicular    History reviewed. No pertinent past surgical history.  Allergies: Review of patient's allergies indicates no known allergies.  Medications: Prior to Admission medications   Medication Sig Start Date End Date Taking? Authorizing Provider  BAYER CONTOUR NEXT TEST test strip  11/29/13   Historical Provider, MD  guaiFENesin-codeine 100-10 MG/5ML syrup Take 5 mLs by mouth every 6 (six) hours as needed for cough. 02/06/14   Truitt Merle, MD  insulin glargine (LANTUS) 100 UNIT/ML injection Inject 5 Units into the skin at bedtime.    Historical Provider, MD  Magnesium 400 MG TABS Take 400 mg by mouth daily.    Historical Provider, MD  metFORMIN (GLUCOPHAGE) 500 MG tablet Take 500 mg by mouth 2 (two) times daily with a meal.    Historical Provider, MD  Multiple Vitamin (MULTIVITAMIN) tablet Take 1 tablet by mouth daily.    Historical Provider, MD  naproxen sodium (ANAPROX) 220 MG tablet Take 220 mg by mouth as needed.    Historical Provider, MD  omeprazole (PRILOSEC) 20 MG capsule Take 20 mg by mouth daily.    Historical Provider, MD    oxyCODONE-acetaminophen (PERCOCET/ROXICET) 5-325 MG per tablet Take 1 tablet by mouth every 4 (four) hours as needed for severe pain. 02/06/14   Truitt Merle, MD  pravastatin (PRAVACHOL) 40 MG tablet Take 40 mg by mouth. Daily 11/20/13   Historical Provider, MD  psyllium (REGULOID) 0.52 G capsule Take 5 capsules by mouth daily.    Historical Provider, MD    Family History  Problem Relation Age of Onset  . Diabetes Father   . Cancer Maternal Uncle     History   Social History  . Marital Status: Married    Spouse Name: N/A    Number of Children: N/A  . Years of Education: N/A   Social History Main Topics  . Smoking status: Former Smoker    Types: Pipe  . Smokeless tobacco: None  . Alcohol Use: No  . Drug Use: No  . Sexual Activity: None   Other Topics Concern  . None   Social History Narrative    Review of Systems: A 12 point ROS discussed and pertinent positives are indicated in the HPI above.  All other systems are negative.  Review of Systems  Vital Signs: BP 134/80 mmHg  Pulse 65  Temp(Src) 98.3 F (36.8 C) (Oral)  Resp 18  SpO2 98%  Physical Exam  Constitutional: He is oriented to person, place, and time. No distress.  HENT:  Head: Normocephalic and atraumatic.  Neck: No tracheal deviation present.  Cardiovascular: Normal rate and regular rhythm.  Exam reveals no  gallop and no friction rub.   No murmur heard. Pulmonary/Chest: Effort normal and breath sounds normal. No respiratory distress. He has no wheezes. He has no rales.  Abdominal: Soft. Bowel sounds are normal. He exhibits no distension. There is no tenderness.  Musculoskeletal: He exhibits tenderness.  Neurological: He is alert and oriented to person, place, and time.  Skin: Skin is warm and dry. He is not diaphoretic.  Psychiatric: He has a normal mood and affect. His behavior is normal. Thought content normal.    Imaging: Ct Chest W Contrast  02/02/2014   CLINICAL DATA:  Lytic bone lesions,  history of testicular cancer  EXAM: CT CHEST, ABDOMEN, AND PELVIS WITH CONTRAST  TECHNIQUE: Multidetector CT imaging of the chest, abdomen and pelvis was performed following the standard protocol during bolus administration of intravenous contrast.  CONTRAST:  146m OMNIPAQUE IOHEXOL 300 MG/ML  SOLN  COMPARISON:  CT chest dated 03/31/2013 and 01/29/2005. CT abdomen pelvis dated 03/10/2011.  FINDINGS: CT CHEST FINDINGS  Lungs are clear. No suspicious pulmonary nodules. No pleural effusion or pneumothorax.  3.2 cm right thyroid nodule/cyst, chronic.  The heart is top-normal in size. No pericardial effusion. Coronary atherosclerosis.  No suspicious mediastinal, hilar, or axillary lymphadenopathy.  Mild degenerative changes of the thoracic spine. Benign bone island in the right lateral 7th rib (series 2/image 47).  Lytic sternal metastasis with a soft tissue component (series 6/image 71). Lytic metastases involving the left posterolateral 9th and 10th ribs (series 2/ image 51).  Right lateral 9th and 10th rib fractures (series 2/ images 50 and 70), without definite underlying mass, favored to be posttraumatic.  CT ABDOMEN AND PELVIS FINDINGS  Hepatobiliary: Two probable cyst in the right hepatic lobe measuring up to 10 mm (series 2/ image 58), unchanged.  Gallbladder is unremarkable. No intrahepatic or extrahepatic ductal dilatation.  Pancreas: Within normal limits.  Spleen: Within normal limits.  Adrenals/Urinary Tract: Adrenal glands are unremarkable.  Right renal cysts measuring up to 2.0 cm in the posterior right upper pole (series 2/image 64). Left kidney is within normal limits. No hydronephrosis.  Bladder is within normal limits.  Stomach/Bowel: Stomach is unremarkable.  No evidence of bowel obstruction.  Normal appendix.  Sigmoid diverticulosis, without associated inflammatory changes.  Vascular/Lymphatic: No evidence of abdominal aortic aneurysm.  No suspicious abdominopelvic lymphadenopathy.  Reproductive:  Prostatomegaly, with enlargement of the central gland which indents the base of the bladder.  Postsurgical changes related to prior right orchiectomy.  Other: No abdominopelvic ascites.  Musculoskeletal: Degenerative changes of the lumbar spine. Grade 1 anterolisthesis of L5 on S1.  IMPRESSION: Lytic metastases in the sternum and left posterolateral 9th and 10th ribs.  Right lateral 9th and 10th rib fractures, without definite underlying mass, possibly posttraumatic.  These findings remain concerning for metastatic disease or multiple myeloma.  No definite primary neoplasm is visualized.  Postsurgical changes related to prior right orchiectomy.   Electronically Signed   By: SJulian HyM.D.   On: 02/02/2014 11:11   Ct Abdomen Pelvis W Contrast  02/02/2014   CLINICAL DATA:  Lytic bone lesions, history of testicular cancer  EXAM: CT CHEST, ABDOMEN, AND PELVIS WITH CONTRAST  TECHNIQUE: Multidetector CT imaging of the chest, abdomen and pelvis was performed following the standard protocol during bolus administration of intravenous contrast.  CONTRAST:  1031mOMNIPAQUE IOHEXOL 300 MG/ML  SOLN  COMPARISON:  CT chest dated 03/31/2013 and 01/29/2005. CT abdomen pelvis dated 03/10/2011.  FINDINGS: CT CHEST FINDINGS  Lungs are clear. No  suspicious pulmonary nodules. No pleural effusion or pneumothorax.  3.2 cm right thyroid nodule/cyst, chronic.  The heart is top-normal in size. No pericardial effusion. Coronary atherosclerosis.  No suspicious mediastinal, hilar, or axillary lymphadenopathy.  Mild degenerative changes of the thoracic spine. Benign bone island in the right lateral 7th rib (series 2/image 47).  Lytic sternal metastasis with a soft tissue component (series 6/image 71). Lytic metastases involving the left posterolateral 9th and 10th ribs (series 2/ image 51).  Right lateral 9th and 10th rib fractures (series 2/ images 50 and 70), without definite underlying mass, favored to be posttraumatic.  CT ABDOMEN  AND PELVIS FINDINGS  Hepatobiliary: Two probable cyst in the right hepatic lobe measuring up to 10 mm (series 2/ image 58), unchanged.  Gallbladder is unremarkable. No intrahepatic or extrahepatic ductal dilatation.  Pancreas: Within normal limits.  Spleen: Within normal limits.  Adrenals/Urinary Tract: Adrenal glands are unremarkable.  Right renal cysts measuring up to 2.0 cm in the posterior right upper pole (series 2/image 64). Left kidney is within normal limits. No hydronephrosis.  Bladder is within normal limits.  Stomach/Bowel: Stomach is unremarkable.  No evidence of bowel obstruction.  Normal appendix.  Sigmoid diverticulosis, without associated inflammatory changes.  Vascular/Lymphatic: No evidence of abdominal aortic aneurysm.  No suspicious abdominopelvic lymphadenopathy.  Reproductive: Prostatomegaly, with enlargement of the central gland which indents the base of the bladder.  Postsurgical changes related to prior right orchiectomy.  Other: No abdominopelvic ascites.  Musculoskeletal: Degenerative changes of the lumbar spine. Grade 1 anterolisthesis of L5 on S1.  IMPRESSION: Lytic metastases in the sternum and left posterolateral 9th and 10th ribs.  Right lateral 9th and 10th rib fractures, without definite underlying mass, possibly posttraumatic.  These findings remain concerning for metastatic disease or multiple myeloma.  No definite primary neoplasm is visualized.  Postsurgical changes related to prior right orchiectomy.   Electronically Signed   By: Julian Hy M.D.   On: 02/02/2014 11:11   Ct Maxillofacial W/cm  02/01/2014   ADDENDUM REPORT: 02/01/2014 10:57  ADDENDUM: It should be noted that the soft tissue lesion arising from the left maxillary antrum does represent one of the lytic lesions identified throughout the visualized bony structures.   Electronically Signed   By: Inez Catalina M.D.   On: 02/01/2014 10:57   02/01/2014   CLINICAL DATA:  Abnormal Panorex with left facial pain   EXAM: CT MAXILLOFACIAL WITH CONTRAST  TECHNIQUE: Multidetector CT imaging of the maxillofacial structures was performed with intravenous contrast. Multiplanar CT image reconstructions were also generated. A small metallic BB was placed on the right temple in order to reliably differentiate right from left.  CONTRAST:  34m OMNIPAQUE IOHEXOL 300 MG/ML  SOLN  COMPARISON:  None.  FINDINGS: Diffuse atrophic changes are noted intracranially. Scattered lytic lesions are noted throughout the bony structures of the maxillofacial bones as well as the visualized portions of the skull. These are most notable in the region of the maxillary antra bilaterally particularly on the left. A 2.5 x 2.2 cm enhancing soft tissue mass lesion is in noted distorting the left maxillary antrum and causing erosive changes of the lateral and inferior walls. This extends into the maxilla and there is suspicious for erosion of underlying molar root. This particular lesion corresponds to an area of pain. A similar but smaller lesion is noted involving the inferior aspect of the right maxillary antrum. Involvement of the cervical spine is noted as well with multiple lytic lesions identified.  The  orbits and their contents are within normal limits. No significant lymphadenopathy is noted.  IMPRESSION: Multiple lytic lesions involving the bony structures most prominent in the region of the left maxillary antrum as described. Differential includes multiple myeloma as well as metastatic disease. Further evaluation with laboratory testing as well as CT of the chest abdomen pelvis with contrast is recommended to rule out an underlying primary neoplasm.  These results will be called to the ordering clinician or representative by the Radiologist Assistant, and communication documented in the PACS or zVision Dashboard.  Electronically Signed: By: Inez Catalina M.D. On: 02/01/2014 10:22    Labs:  CBC:  Recent Labs  02/12/14 0940  WBC 6.2  HGB 13.2    HCT 39.6  PLT 188    COAGS:  Recent Labs  02/06/14 1210 02/12/14 0940  INR 1.00* 0.98  APTT  --  29    TUMOR MARKERS:  Recent Labs  02/06/14 1210  AFPTM 4.7  CEA 0.5    Assessment and Plan: Multifocal lytic bone lesions with pain History of testicular cancer s/p right orchiectomy and chemotherapy.  Scheduled today for image guided sternum lesion biopsy and bone marrow biopsy with moderate sedation Patient has been NPO, no blood thinners taken, labs reviewed. Risks and Benefits discussed with the patient. All of the patient's questions were answered, patient is agreeable to proceed. Consent signed and in chart.     SignedHedy Jacob 02/12/2014, 10:15 AM

## 2014-02-14 ENCOUNTER — Ambulatory Visit (HOSPITAL_COMMUNITY)
Admission: RE | Admit: 2014-02-14 | Discharge: 2014-02-14 | Disposition: A | Discharge status: Home / Self Care | Payer: BC Managed Care – PPO | Source: Ambulatory Visit | Attending: Hematology | Admitting: Hematology

## 2014-02-14 DIAGNOSIS — Z8547 Personal history of malignant neoplasm of testis: Secondary | ICD-10-CM | POA: Insufficient documentation

## 2014-02-14 DIAGNOSIS — Z9221 Personal history of antineoplastic chemotherapy: Secondary | ICD-10-CM | POA: Insufficient documentation

## 2014-02-14 DIAGNOSIS — M899 Disorder of bone, unspecified: Secondary | ICD-10-CM | POA: Diagnosis not present

## 2014-02-14 LAB — GLUCOSE, CAPILLARY: Glucose-Capillary: 81 mg/dL (ref 70–99)

## 2014-02-14 MED ORDER — FLUDEOXYGLUCOSE F - 18 (FDG) INJECTION
9.3700 | Freq: Once | INTRAVENOUS | Status: AC | PRN
  Administered 2014-02-14: 9.37 via INTRAVENOUS

## 2014-02-19 ENCOUNTER — Ambulatory Visit (HOSPITAL_BASED_OUTPATIENT_CLINIC_OR_DEPARTMENT_OTHER): Payer: BC Managed Care – PPO | Admitting: Hematology

## 2014-02-19 ENCOUNTER — Other Ambulatory Visit: Payer: Self-pay | Admitting: *Deleted

## 2014-02-19 ENCOUNTER — Encounter: Payer: Self-pay | Admitting: Hematology

## 2014-02-19 ENCOUNTER — Ambulatory Visit (HOSPITAL_BASED_OUTPATIENT_CLINIC_OR_DEPARTMENT_OTHER): Payer: BC Managed Care – PPO

## 2014-02-19 ENCOUNTER — Telehealth: Payer: Self-pay | Admitting: Hematology

## 2014-02-19 ENCOUNTER — Other Ambulatory Visit: Payer: Self-pay | Admitting: Hematology

## 2014-02-19 ENCOUNTER — Telehealth: Payer: Self-pay | Admitting: Hematology and Oncology

## 2014-02-19 ENCOUNTER — Other Ambulatory Visit: Payer: BC Managed Care – PPO

## 2014-02-19 VITALS — BP 131/85 | HR 67 | Temp 98.1°F | Resp 18 | Ht 72.0 in | Wt 192.6 lb

## 2014-02-19 DIAGNOSIS — C9000 Multiple myeloma not having achieved remission: Secondary | ICD-10-CM

## 2014-02-19 DIAGNOSIS — E858 Other amyloidosis: Secondary | ICD-10-CM

## 2014-02-19 DIAGNOSIS — M899 Disorder of bone, unspecified: Secondary | ICD-10-CM

## 2014-02-19 DIAGNOSIS — C9001 Multiple myeloma in remission: Secondary | ICD-10-CM | POA: Insufficient documentation

## 2014-02-19 HISTORY — DX: Multiple myeloma not having achieved remission: C90.00

## 2014-02-19 LAB — COMPREHENSIVE METABOLIC PANEL (CC13)
ALK PHOS: 95 U/L (ref 40–150)
ALT: 38 U/L (ref 0–55)
AST: 37 U/L — AB (ref 5–34)
Albumin: 4.2 g/dL (ref 3.5–5.0)
Anion Gap: 10 mEq/L (ref 3–11)
BUN: 18 mg/dL (ref 7.0–26.0)
CO2: 25 mEq/L (ref 22–29)
Calcium: 9.8 mg/dL (ref 8.4–10.4)
Chloride: 103 mEq/L (ref 98–109)
Creatinine: 1.2 mg/dL (ref 0.7–1.3)
Glucose: 106 mg/dl (ref 70–140)
Potassium: 3.8 mEq/L (ref 3.5–5.1)
Sodium: 138 mEq/L (ref 136–145)
Total Bilirubin: 0.73 mg/dL (ref 0.20–1.20)
Total Protein: 8.7 g/dL — ABNORMAL HIGH (ref 6.4–8.3)

## 2014-02-19 LAB — CBC WITH DIFFERENTIAL/PLATELET
BASO%: 0.4 % (ref 0.0–2.0)
Basophils Absolute: 0 10*3/uL (ref 0.0–0.1)
EOS%: 4.1 % (ref 0.0–7.0)
Eosinophils Absolute: 0.3 10*3/uL (ref 0.0–0.5)
HEMATOCRIT: 40.8 % (ref 38.4–49.9)
HGB: 13.5 g/dL (ref 13.0–17.1)
LYMPH%: 32.4 % (ref 14.0–49.0)
MCH: 30.3 pg (ref 27.2–33.4)
MCHC: 33.1 g/dL (ref 32.0–36.0)
MCV: 91.7 fL (ref 79.3–98.0)
MONO#: 0.9 10*3/uL (ref 0.1–0.9)
MONO%: 13.3 % (ref 0.0–14.0)
NEUT#: 3.3 10*3/uL (ref 1.5–6.5)
NEUT%: 49.8 % (ref 39.0–75.0)
Platelets: 212 10*3/uL (ref 140–400)
RBC: 4.45 10*6/uL (ref 4.20–5.82)
RDW: 14.5 % (ref 11.0–14.6)
WBC: 6.7 10*3/uL (ref 4.0–10.3)
lymph#: 2.2 10*3/uL (ref 0.9–3.3)

## 2014-02-19 NOTE — Telephone Encounter (Signed)
Gave avs & cal for Dec. Pt is wanting to wait before sch tx.

## 2014-02-19 NOTE — Progress Notes (Signed)
PATIENT TO TRIAGE ASKING WHY HE HAS NOT BEEN SCHEDULED FOR CHEMOTHERAPY EDUCATION CLASS.  VERBAL ORDER RECEIVED AND READ BACK FROM DR. FENG FOR EDUCATION CLASS TO BE BEFORE  CHEMOTHERAPY ON [place] OF THIS WEEK.  P.O.F. GENERATED AND SENT URGENT TO SCHEDULERS.

## 2014-02-19 NOTE — Progress Notes (Signed)
[facility] OFFICE PROGRESS NOTE  Patient Care Team: Hulan Fess, MD as PCP - General (Family Medicine) Truitt Merle, MD as Consulting Physician (Hematology)  SUMMARY OF ONCOLOGIC HISTORY:   Multiple myeloma   02/01/2014 Imaging CT scan showed multiple lytic lesions involving the bony structures most prominent in the region of the left maxillary antrum and bilateral ribs with pathological fractures    02/12/2014 Initial Diagnosis Multiple myeloma   02/12/2014 Bone Marrow Biopsy sternal bone marrow aspiration and biopsy revealed 75% plasma cells,     INTERVAL HISTORY: Tyler Willis returns for follow-up with the company of his wife. He tolerated the sternum bone marrow aspiration and biopsy well last week without any complications. He denies any significant changes since I saw him last time. He takes Percocet once at night and his pain is improved and night. He has not needed because therapy yet. His appetite and energy level remains fairly well.  REVIEW OF SYSTEMS:   Constitutional: Denies fevers, chills or abnormal weight loss Eyes: Denies blurriness of vision Ears, nose, mouth, throat, and face: Denies mucositis or sore throat Respiratory: Denies cough, dyspnea or wheezes Cardiovascular: Denies palpitation, chest discomfort or lower extremity swelling Gastrointestinal:  Denies nausea, heartburn or change in bowel habits Skin: Denies abnormal skin rashes Lymphatics: Denies new lymphadenopathy or easy bruising Neurological:Denies numbness, tingling or new weaknesses Behavioral/Psych: Mood is stable, no new changes  Musculoskeletal: Positive for pain on bilateral back and side chest, and left jaw. All other systems were reviewed with the patient and are negative.  MEDICAL HISTORY:  Past Medical History  Diagnosis Date  . Diabetes mellitus without complication   . GERD (gastroesophageal reflux disease)   . 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 Multiple myeloma 02/19/2014    SURGICAL  HISTORY: History reviewed. No pertinent past surgical history.  I have reviewed the social history and family history with the patient and they are unchanged from previous note.  ALLERGIES:  has No Known Allergies.  MEDICATIONS:  Current Outpatient Prescriptions  Medication Sig Dispense Refill  . BAYER CONTOUR NEXT TEST test strip   5  . insulin glargine (LANTUS) 100 UNIT/ML injection Inject 5 Units into the skin at bedtime.    . Magnesium 400 MG TABS Take 400 mg by mouth daily.    . metFORMIN (GLUCOPHAGE) 500 MG tablet Take 500 mg by mouth 2 (two) times daily with a meal.    . Multiple Vitamin (MULTIVITAMIN) tablet Take 1 tablet by mouth daily.    . naproxen sodium (ANAPROX) 220 MG tablet Take 220 mg by mouth as needed.    Marland Kitchen omeprazole (PRILOSEC) 20 MG capsule Take 20 mg by mouth daily.    Marland Kitchen oxyCODONE-acetaminophen (PERCOCET/ROXICET) 5-325 MG per tablet Take 1 tablet by mouth every 4 (four) hours as needed for severe pain. 30 tablet 0  . pravastatin (PRAVACHOL) 40 MG tablet Take 40 mg by mouth. Daily  1  . psyllium (REGULOID) 0.52 G capsule Take 5 capsules by mouth daily.    Marland Kitchen guaiFENesin-codeine 100-10 MG/5ML syrup Take 5 mLs by mouth every 6 (six) hours as needed for cough. (Patient not taking: Reported on 02/19/2014) 473 mL 0   No current facility-administered medications for this visit.    PHYSICAL EXAMINATION: ECOG PERFORMANCE STATUS: 1 - Symptomatic but completely ambulatory  Filed Vitals:   02/19/14 1019  BP: 131/85  Pulse: 67  Temp: 98.1 F (36.7 C)  Resp: 18   Filed Weights   02/19/14 1019  Weight: 192 lb 9.6 oz (87.363 kg)    GENERAL:alert, no distress and comfortable SKIN: skin color, texture, turgor are normal, no rashes or significant lesions EYES: normal, Conjunctiva are pink and non-injected, sclera clear OROPHARYNX:no exudate, no erythema and lips, buccal mucosa, and tongue normal  NECK: supple, thyroid normal size, non-tender, without nodularity LYMPH:   no palpable lymphadenopathy in the cervical, axillary or inguinal LUNGS: clear to auscultation and percussion with normal breathing effort HEART: regular rate & rhythm and no murmurs and no lower extremity edema ABDOMEN:abdomen soft, non-tender and normal bowel sounds Musculoskeletal:no cyanosis of digits and no clubbing  NEURO: alert & oriented x 3 with fluent speech, no focal motor/sensory deficits  LABORATORY DATA:  I have reviewed the data as listed    Component Value Date/Time   NA 138 02/19/2014 1306   NA 125* 02/17/2012 1720   K 3.8 02/19/2014 1306   K 4.3 02/17/2012 1720   CL 90* 02/17/2012 1720   CO2 25 02/19/2014 1306   CO2 25 02/17/2012 1720   GLUCOSE 106 02/19/2014 1306   GLUCOSE 474* 02/17/2012 1720   BUN 18.0 02/19/2014 1306   BUN 14 02/17/2012 1720   CREATININE 1.2 02/19/2014 1306   CREATININE 1.10 02/17/2012 1720   CALCIUM 9.8 02/19/2014 1306   CALCIUM 9.6 02/17/2012 1720   PROT 8.7* 02/19/2014 1306   ALBUMIN 4.2 02/19/2014 1306   AST 37* 02/19/2014 1306   ALT 38 02/19/2014 1306   ALKPHOS 95 02/19/2014 1306   BILITOT 0.73 02/19/2014 1306   GFRNONAA 72* 02/17/2012 1720   GFRAA 83* 02/17/2012 1720    No results found for: SPEP, UPEP  Lab Results  Component Value Date   WBC 6.7 02/19/2014   NEUTROABS 3.3 02/19/2014   HGB 13.5 02/19/2014   HCT 40.8 02/19/2014   MCV 91.7 02/19/2014   PLT 212 02/19/2014      Chemistry      Component Value Date/Time   NA 138 02/19/2014 1306   NA 125* 02/17/2012 1720   K 3.8 02/19/2014 1306   K 4.3 02/17/2012 1720   CL 90* 02/17/2012 1720   CO2 25 02/19/2014 1306   CO2 25 02/17/2012 1720   BUN 18.0 02/19/2014 1306   BUN 14 02/17/2012 1720   CREATININE 1.2 02/19/2014 1306   CREATININE 1.10 02/17/2012 1720      Component Value Date/Time   CALCIUM 9.8 02/19/2014 1306   CALCIUM 9.6 02/17/2012 1720   ALKPHOS 95 02/19/2014 1306   AST 37* 02/19/2014 1306   ALT 38 02/19/2014 1306   BILITOT 0.73 02/19/2014 1306       PATHOLOGY REPORT 02/12/2014 Bone, biopsy, sternum - INVOLVEMENT BY PLASMA CELL NEOPLASM, SEE COMMENT. Diagnosis Note The biopsy reveals clusters and sheets of atypical plasma cells with prominent nucleoli and frequent Dutcher bodies. Immunohistochemistry reveals staining for CD138 with abundant background staining. Kappa light chain immunohistochemistry reveals weak cytoplasmic staining with a high level of background staining, hampering interpretation. However, lambda light chain immunohistochemistry reveals much less background staining and definitively negative plasma cells, suggesting the plasma cells are indeed kappa-restricted. There is some eosinophilic amorphous type material. Initial congo red staining was technically unsatisfactory and a repeat will be repeated and reported in an addendum. Overall, the findings are consistent with involvement by the patient's plasma cell neoplasm. Vicente Males MD Pathologist, Electronic Signature (Case signed 02/14/2014)  Bone Marrow, Aspirate,Biopsy, and Clot - PLASMA CELL NEOPLASM. - NO EVIDENCE OF METASTATIC TUMOR. - SEE COMMENT. PERIPHERAL BLOOD: -  UNREMARKABLE. Diagnosis Note Overall, the findings are consistent with a plasma cell neoplasm. These findings correlate with involvement of the separately submitted sternal lesion ([id number]). Correlation with clinical, laboratory, and radiographic data is required for further classification. Congo red will be repeated and reported in an addendum.  BONE MARROW ASPIRATE: Spicular and cellular. Erythroid precursors: Overall reduced in numbers. No significant dysplasia. Granulocytic precursors: Overall reduced in numbers. No significant dysplasia. Megakaryocytes: Present, but reduced. Lymphocytes/plasma cells: Plasma cells are increase in numbers (75% by manual differential counts), including large aggregates. The plasma cells are atypical with large forms and prominent  nucleoli. Lymphocytes are not increased. Spe Congo red staining reveals amyloid deposition within some of the amorphous material and within the blood Vessels. cimen Gross and Clinical Information    RADIOGRAPHIC STUDIES: I have personally reviewed the radiological images as listed and agreed with the findings in the report. No results found.   ASSESSMENT & PLAN:  63 year old male with past medical history of diabetes but otherwise very healthy and active, who presented with multiple lytic bone lesions involving left maxillary antrum and bilateral ribs with pathological fractures and associated pains. His bone marrow biopsy reviewed at 75% plasma cells, kappa- restricted, and a positive amyloid deposition. He has negative SPEP and UPEP, nonsecretory, no extra medullary lesions. PS=1  1. Multiple myeloma, nonsecretory, kappa- restricted, pending stage  -I discussed the above bone marrow biopsy with patient and his wife extensively. He was quite shockingto them, especially to his wife. She had multiple questions, which I all addressed. -I recommend to transfer his oncology care to my colleague Dr. Alvy Bimler, who is specialized in treating malignant hematological diseases. I have discussed the case with her and she is has kindly agreed to see him in 3 days. -he has no other organ damage, relatively young with good performance status, would be a good candidate for autologous stem cell transplant. We'll refer him to [facility] for BMT consultation. -I recommend starting chemotherapy with Cytoxan, bortezomib and dexamethasone this week. I'll scheduled after his follow-up visit with Dr. Alvy Bimler. [facility] obtain serum and urine light chains, quantitative immunoglobulin, and beta-2 microglobulin today.  2. Immunoglobulin light chain amyloidosis -He has no overt organ involvement. We will treat underlying multiple myeloma.  3. Multiple myeloma-related bone pain -I recommend him to take Percocet as  needed. He knows to be cautious about 4 and injury to avoid more fractures.  All questions were answered. The patient knows to call the clinic with any problems, questions or concerns. No barriers to learning was detected. I provided them with reading material about multiple myeloma, Cytoxan and  Bortezomib.   I spent 40 minutes counseling the patient face to face. The total time spent in the appointment was 55 minutes and more than 50% was on counseling and review of test results   Orders Placed This Encounter  Procedures  . CBC with Differential    Standing Status: Future     Number of Occurrences: 1     Standing Expiration Date: 03/01/2014  . Comprehensive metabolic panel (Cmet) - [facility]    Standing Status: Future     Number of Occurrences: 1     Standing Expiration Date: 03/01/2014  . Beta 2 microglobulin    Standing Status: Future     Number of Occurrences: 1     Standing Expiration Date: 03/01/2014  . QIG  (Quant. immunoglobulins  - IgG, IgA, IgM)    Standing Status: Future     Number of Occurrences: 1  Standing Expiration Date: 03/01/2014  . SPEP with reflex to IFE    Standing Status: Future     Number of Occurrences: 1     Standing Expiration Date: 03/01/2014  . Kappa/lambda light chains    Standing Status: Future     Number of Occurrences: 1     Standing Expiration Date: 03/01/2014  . Kappa/lambda light chains    Standing Status: Future     Number of Occurrences:      Standing Expiration Date: 03/01/2014  . Francee Nodal Test    Standing Status: Future     Number of Occurrences: 1     Standing Expiration Date: 02/19/2015    Order Specific Question:  Test Name:    Answer:  random urine for light chains       Truitt Merle, MD 02/19/2014 10:43 PM

## 2014-02-19 NOTE — Telephone Encounter (Signed)
gv and printed appt sched for DEC

## 2014-02-20 ENCOUNTER — Other Ambulatory Visit: Payer: BC Managed Care – PPO

## 2014-02-20 ENCOUNTER — Encounter: Payer: Self-pay | Admitting: *Deleted

## 2014-02-20 LAB — KAPPA/LAMBDA LIGHT CHAINS
Kappa free light chain: 30.1 mg/dL — ABNORMAL HIGH (ref 0.33–1.94)
Kappa:Lambda Ratio: 250.83 — ABNORMAL HIGH (ref 0.26–1.65)
LAMBDA FREE LGHT CHN: 0.12 mg/dL — AB (ref 0.57–2.63)

## 2014-02-21 ENCOUNTER — Telehealth: Payer: Self-pay | Admitting: Hematology

## 2014-02-21 LAB — PROTEIN ELECTROPHORESIS, SERUM, WITH REFLEX
Albumin ELP: 52.1 % — ABNORMAL LOW (ref 55.8–66.1)
Alpha-1-Globulin: 2.9 % (ref 2.9–4.9)
Alpha-2-Globulin: 11 % (ref 7.1–11.8)
BETA 2: 2.9 % — AB (ref 3.2–6.5)
Beta Globulin: 4 % — ABNORMAL LOW (ref 4.7–7.2)
Gamma Globulin: 27.1 % — ABNORMAL HIGH (ref 11.1–18.8)
M-SPIKE, %: 1.71 g/dL
Total Protein, Serum Electrophoresis: 8.3 g/dL (ref 6.0–8.3)

## 2014-02-21 LAB — IGG, IGA, IGM
IgA: 27 mg/dL — ABNORMAL LOW (ref 68–379)
IgG (Immunoglobin G), Serum: 2420 mg/dL — ABNORMAL HIGH (ref 650–1600)
IgM, Serum: 12 mg/dL — ABNORMAL LOW (ref 41–251)

## 2014-02-21 LAB — KAPPA/LAMBDA LIGHT CHAINS, FREE, WITH RATIO, 24HR. URINE
KAPPA Ligh Chain, Free U: 11.4 mg/L (ref 1.35–24.19)
Kappa/Lambda, Free Ratio: 60 — ABNORMAL HIGH (ref 2.04–10.37)
LAMBDA LIGHT CHAIN, FREE U: 0.19 mg/L — AB (ref 0.24–6.66)

## 2014-02-21 LAB — BETA 2 MICROGLOBULIN, SERUM: Beta-2 Microglobulin: 4.48 mg/L — ABNORMAL HIGH (ref <=2.51)

## 2014-02-21 LAB — IFE INTERPRETATION

## 2014-02-21 NOTE — Telephone Encounter (Signed)
PT APPT. WITH DR MCIVER AT [facility] IS 8:15. FAXED MEDICAL RECORDS. SLIDES AND SCANS WILL BE FEDEX'ED. CALLED PT LVM IN REF TO APPT.

## 2014-02-22 ENCOUNTER — Encounter: Payer: Self-pay | Admitting: *Deleted

## 2014-02-22 ENCOUNTER — Ambulatory Visit (HOSPITAL_BASED_OUTPATIENT_CLINIC_OR_DEPARTMENT_OTHER): Payer: BC Managed Care – PPO | Admitting: Hematology and Oncology

## 2014-02-22 ENCOUNTER — Other Ambulatory Visit: Payer: Self-pay | Admitting: *Deleted

## 2014-02-22 ENCOUNTER — Ambulatory Visit: Payer: BC Managed Care – PPO

## 2014-02-22 ENCOUNTER — Telehealth: Payer: Self-pay | Admitting: Hematology and Oncology

## 2014-02-22 ENCOUNTER — Encounter: Payer: Self-pay | Admitting: Hematology and Oncology

## 2014-02-22 VITALS — BP 149/72 | HR 89 | Temp 98.2°F | Resp 18 | Ht 72.0 in | Wt 198.2 lb

## 2014-02-22 DIAGNOSIS — M279 Disease of jaws, unspecified: Secondary | ICD-10-CM

## 2014-02-22 DIAGNOSIS — C6291 Malignant neoplasm of right testis, unspecified whether descended or undescended: Secondary | ICD-10-CM

## 2014-02-22 DIAGNOSIS — E853 Secondary systemic amyloidosis: Secondary | ICD-10-CM | POA: Insufficient documentation

## 2014-02-22 DIAGNOSIS — E119 Type 2 diabetes mellitus without complications: Secondary | ICD-10-CM

## 2014-02-22 DIAGNOSIS — E859 Amyloidosis, unspecified: Secondary | ICD-10-CM

## 2014-02-22 DIAGNOSIS — C9000 Multiple myeloma not having achieved remission: Secondary | ICD-10-CM

## 2014-02-22 DIAGNOSIS — I2510 Atherosclerotic heart disease of native coronary artery without angina pectoris: Secondary | ICD-10-CM

## 2014-02-22 MED ORDER — DEXAMETHASONE 4 MG PO TABS: ORAL_TABLET | ORAL | Status: DC

## 2014-02-22 MED ORDER — CYCLOPHOSPHAMIDE 50 MG PO TABS: ORAL_TABLET | ORAL | Status: DC

## 2014-02-22 MED ORDER — PROCHLORPERAZINE MALEATE 10 MG PO TABS: 10.0000 mg | ORAL_TABLET | Freq: Four times a day (QID) | ORAL | Status: DC | PRN

## 2014-02-22 MED ORDER — ONDANSETRON HCL 8 MG PO TABS: 8.0000 mg | ORAL_TABLET | Freq: Three times a day (TID) | ORAL | Status: DC | PRN

## 2014-02-22 MED ORDER — ACYCLOVIR 400 MG PO TABS: 400.0000 mg | ORAL_TABLET | Freq: Every day | ORAL | Status: DC

## 2014-02-22 NOTE — Progress Notes (Signed)
[facility], [id number], approved ondansetron from 01/23/14-02/22/15

## 2014-02-22 NOTE — Assessment & Plan Note (Signed)
He is at significant risk of severe hypoglycemia due to future treatment with dexamethasone. We discussed importance of close follow-up with PCP for diabetes management during treatment

## 2014-02-22 NOTE — Assessment & Plan Note (Signed)
The patient would be candidate for IV bisphosphonate. I am concerned about the mandible lesion and risk of osteonecrosis of the jaw. I recommend he returns to his dentist for dental clearance prior to starting him on treatment with Zometa.

## 2014-02-22 NOTE — Assessment & Plan Note (Addendum)
I will order cardiac biomarkers in the future. I would recommend sending his tissue block to [facility] for mass spectrometry for tissue subtype. 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 As mentioned above, combination therapy with Cytoxan, dexamethasone and Velcade is an approved regimen for amyloidosis. The dosing for amyloidosis is quite different compared to the dosing for multiple myeloma. Due to significant presence of plasma cells in his bone marrow, I recommend treating him aggressively with twice-weekly regimen of Velcade rather than weekly. I recommend cardiology evaluation to exclude cardiac amyloidosis as this would impact decision for bone marrow transplant in the future

## 2014-02-22 NOTE — Assessment & Plan Note (Signed)
Clinically, he has no recurrence.

## 2014-02-22 NOTE — Telephone Encounter (Signed)
gv and printed appt sched and avs for pt for Dec and Jan 2016....sed added tx. °

## 2014-02-22 NOTE — Progress Notes (Unsigned)
RECEIVED A FAX FROM [facility] CONCERNING A PRIOR AUTHORIZATION FOR ONDANSETRON. THIS REQUEST WAS PLACED IN THE MANAGED CARE BIN. 

## 2014-02-22 NOTE — Assessment & Plan Note (Signed)
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 The patient would definitely need second opinion/bone marrow transplant evaluation. I will attempt to contact the physicians at [facility] to expedite his appointment. We discussed importance of induction chemotherapy prior to bone marrow transplant. I recommend regimen involving Cytoxan, dexamethasone and Velcade that works well for both multiple myeloma and amyloidosis. We discussed the role of chemotherapy. The intent is for palliative.  We discussed some of the risks, benefits, side-effects of Cytoxan, Bortezemib and Dexamethasone.   Some of the short term side-effects included, though not limited to, risk of fatigue, risk of allergic reactions, mouth sores, weight loss, pancytopenia, life-threatening infections, need for transfusions of blood products, nausea, vomiting, change in bowel habits, blood clots, admission to hospital for various reasons, and risks of death.   Long term side-effects are also discussed including risks of infertility, permanent damage to nerve function, chronic fatigue, and rare secondary malignancy including bone marrow disorders and leukemia.   The patient is aware that the response rates discussed earlier is not guaranteed.  After a long discussion, patient made an informed decision to proceed with the prescribed plan of care. The patient will need intravenous bisphosphonate. I will proceed with that after we obtain dental clearance. In the meantime, I recommend vitamin D and calcium supplement. The patient is warned about risk of severe hypoglycemia due to future treatment with dexamethasone.

## 2014-02-22 NOTE — Telephone Encounter (Signed)
Rx for Cytoxan faxed to [place] fax [phone].  Confirmed they received rx and informed pt to start on Monday 12/7.

## 2014-02-22 NOTE — Progress Notes (Signed)
[facility] FOLLOW-UP progress notes  Patient Care Team: Hulan Fess, MD as PCP - General (Family Medicine) Heath Lark, MD as Consulting Physician (Hematology and Oncology)  CHIEF COMPLAINTS/PURPOSE OF VISIT:  Multiple myeloma and amyloidosis  HISTORY OF PRESENTING ILLNESS:  Tyler Willis 63 y.o. male was transferred to my care at the request of his prior oncologist. I reviewed the patient's records extensive and collaborated the history with the patient. Summary of his history is as follows: Oncology History   ISS Staging II, M-spike 1.7, IgG 2420, kappa 30.1, beta26mcroglobulin 4.48, Creatinine 1.2, calcium 9.8, hemoglobin 13.2, albumin 4.2     Multiple myeloma   01/10/2003 Pathology Results [id number] pathology showed pure seminoma with lymphovascular invasion, pT2 NX MX   01/10/2003 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 His initial presentation for current diagnosis was because of jaw discomfort. He will saw his dentist who ordered jaw x-ray and found multiple lytic lesions, leading to further extensive evaluation as outlined above.  Currently, he is not symptomatic. He is anxious to begin treatment. MEDICAL  HISTORY:  Past Medical History  Diagnosis Date  . Diabetes mellitus without complication   . GERD (gastroesophageal reflux disease)   . 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 Multiple myeloma 02/19/2014    SURGICAL HISTORY: History reviewed. No pertinent past surgical history.  SOCIAL HISTORY: History   Social History  . Marital Status: Married    Spouse Name: N/A    Number of Children: N/A  . Years of Education: N/A   Occupational History  . Not on file.   Social History Main Topics  . Smoking status: Former Smoker    Types: Pipe  . Smokeless tobacco: Never Used  . Alcohol Use: No  . Drug Use: No  . Sexual Activity: Not on file   Other Topics Concern  . Not on file   Social History Narrative    FAMILY HISTORY: Family History  Problem Relation Age of Onset  . Diabetes Father   . Cancer Maternal Uncle     lung cancer  . Cancer Cousin     myeloma    ALLERGIES:  has No Known Allergies.  MEDICATIONS:  Current Outpatient Prescriptions  Medication Sig Dispense Refill  . BAYER CONTOUR NEXT TEST test strip   5  . guaiFENesin-codeine 100-10 MG/5ML syrup Take 5 mLs by mouth every 6 (six) hours as needed for cough. 473 mL 0  . insulin glargine (LANTUS) 100 UNIT/ML injection Inject 5 Units into the skin at bedtime.    . Magnesium 400 MG TABS Take 400 mg by mouth daily.    . metFORMIN (GLUCOPHAGE) 500 MG tablet Take 500 mg by mouth 2 (two) times daily with a meal.    . Multiple Vitamin (MULTIVITAMIN) tablet Take 1 tablet by  mouth daily.    . naproxen sodium (ANAPROX) 220 MG tablet Take 220 mg by mouth as needed.    Marland Kitchen omeprazole (PRILOSEC) 20 MG capsule Take 20 mg by mouth daily.    Marland Kitchen oxyCODONE-acetaminophen (PERCOCET/ROXICET) 5-325 MG per tablet Take 1 tablet by mouth every 4 (four) hours as needed for severe pain. 30 tablet 0  . pravastatin (PRAVACHOL) 40 MG tablet Take 40 mg by mouth. Daily  1  . psyllium (REGULOID) 0.52 G capsule Take 5 capsules by mouth daily.    Marland Kitchen  acyclovir (ZOVIRAX) 400 MG tablet Take 1 tablet (400 mg total) by mouth daily. 60 tablet 3  . cyclophosphamide (CYTOXAN) 50 MG tablet Take 6 tablets on days 1,8,15 of chemo 1hr before or 2hr after meals every 21d. 60 tablet 3  . dexamethasone (DECADRON) 4 MG tablet Take 10 pills every Monday with breakfast 90 tablet 1  . ondansetron (ZOFRAN) 8 MG tablet Take 1 tablet (8 mg total) by mouth every 8 (eight) hours as needed. 30 tablet 1  . prochlorperazine (COMPAZINE) 10 MG tablet Take 1 tablet (10 mg total) by mouth every 6 (six) hours as needed (Nausea or vomiting). 30 tablet 1   No current facility-administered medications for this visit.    REVIEW OF SYSTEMS:   Constitutional: Denies fevers, chills or abnormal night sweats Eyes: Denies blurriness of vision, double vision or watery eyes Ears, nose, mouth, throat, and face: Denies mucositis or sore throat Respiratory: Denies cough, dyspnea or wheezes Cardiovascular: Denies palpitation, chest discomfort or lower extremity swelling Gastrointestinal:  Denies nausea, heartburn or change in bowel habits Skin: Denies abnormal skin rashes Lymphatics: Denies new lymphadenopathy or easy bruising Neurological:Denies numbness, tingling or new weaknesses Behavioral/Psych: Mood is stable, no new changes  All other systems were reviewed with the patient and are negative.  PHYSICAL EXAMINATION: ECOG PERFORMANCE STATUS: 0 - Asymptomatic  Filed Vitals:   02/22/14 1153  BP: 149/72  Pulse: 89  Temp: 98.2 F (36.8 C)  Resp: 18   Filed Weights   02/22/14 1153  Weight: 198 lb 3.2 oz (89.903 kg)    GENERAL:alert, no distress and comfortable SKIN: skin color, texture, turgor are normal, no rashes or significant lesions EYES: normal, conjunctiva are pink and non-injected, sclera clear OROPHARYNX:no exudate, normal lips, buccal mucosa, and tongue  Musculoskeletal:no cyanosis of digits and no clubbing  PSYCH: alert & oriented x 3 with fluent  speech NEURO: no focal motor/sensory deficits  LABORATORY DATA:  I have reviewed the data as listed Lab Results  Component Value Date   WBC 6.7 02/19/2014   HGB 13.5 02/19/2014   HCT 40.8 02/19/2014   MCV 91.7 02/19/2014   PLT 212 02/19/2014    Recent Labs  02/19/14 1306  NA 138  K 3.8  CO2 25  GLUCOSE 106  BUN 18.0  CREATININE 1.2  CALCIUM 9.8  PROT 8.7*  ALBUMIN 4.2  AST 37*  ALT 38  ALKPHOS 95  BILITOT 0.73    RADIOGRAPHIC STUDIES: I have personally reviewed the radiological images as listed and agreed with the findings in the report. Ct Chest W Contrast  02/02/2014   CLINICAL DATA:  Lytic bone lesions, history of testicular cancer  EXAM: CT CHEST, ABDOMEN, AND PELVIS WITH CONTRAST  TECHNIQUE: Multidetector CT imaging of the chest, abdomen and pelvis was performed following the standard protocol during bolus administration of intravenous contrast.  CONTRAST:  176m OMNIPAQUE IOHEXOL 300 MG/ML  SOLN  COMPARISON:  CT chest dated 03/31/2013 and  01/29/2005. CT abdomen pelvis dated 03/10/2011.  FINDINGS: CT CHEST FINDINGS  Lungs are clear. No suspicious pulmonary nodules. No pleural effusion or pneumothorax.  3.2 cm right thyroid nodule/cyst, chronic.  The heart is top-normal in size. No pericardial effusion. Coronary atherosclerosis.  No suspicious mediastinal, hilar, or axillary lymphadenopathy.  Mild degenerative changes of the thoracic spine. Benign bone island in the right lateral 7th rib (series 2/image 47).  Lytic sternal metastasis with a soft tissue component (series 6/image 71). Lytic metastases involving the left posterolateral 9th and 10th ribs (series 2/ image 51).  Right lateral 9th and 10th rib fractures (series 2/ images 50 and 70), without definite underlying mass, favored to be posttraumatic.  CT ABDOMEN AND PELVIS FINDINGS  Hepatobiliary: Two probable cyst in the right hepatic lobe measuring up to 10 mm (series 2/ image 58), unchanged.  Gallbladder is  unremarkable. No intrahepatic or extrahepatic ductal dilatation.  Pancreas: Within normal limits.  Spleen: Within normal limits.  Adrenals/Urinary Tract: Adrenal glands are unremarkable.  Right renal cysts measuring up to 2.0 cm in the posterior right upper pole (series 2/image 64). Left kidney is within normal limits. No hydronephrosis.  Bladder is within normal limits.  Stomach/Bowel: Stomach is unremarkable.  No evidence of bowel obstruction.  Normal appendix.  Sigmoid diverticulosis, without associated inflammatory changes.  Vascular/Lymphatic: No evidence of abdominal aortic aneurysm.  No suspicious abdominopelvic lymphadenopathy.  Reproductive: Prostatomegaly, with enlargement of the central gland which indents the base of the bladder.  Postsurgical changes related to prior right orchiectomy.  Other: No abdominopelvic ascites.  Musculoskeletal: Degenerative changes of the lumbar spine. Grade 1 anterolisthesis of L5 on S1.  IMPRESSION: Lytic metastases in the sternum and left posterolateral 9th and 10th ribs.  Right lateral 9th and 10th rib fractures, without definite underlying mass, possibly posttraumatic.  These findings remain concerning for metastatic disease or multiple myeloma.  No definite primary neoplasm is visualized.  Postsurgical changes related to prior right orchiectomy.   Electronically Signed   By: Julian Hy M.D.   On: 02/02/2014 11:11   Ct Abdomen Pelvis W Contrast  02/02/2014   CLINICAL DATA:  Lytic bone lesions, history of testicular cancer  EXAM: CT CHEST, ABDOMEN, AND PELVIS WITH CONTRAST  TECHNIQUE: Multidetector CT imaging of the chest, abdomen and pelvis was performed following the standard protocol during bolus administration of intravenous contrast.  CONTRAST:  125m OMNIPAQUE IOHEXOL 300 MG/ML  SOLN  COMPARISON:  CT chest dated 03/31/2013 and 01/29/2005. CT abdomen pelvis dated 03/10/2011.  FINDINGS: CT CHEST FINDINGS  Lungs are clear. No suspicious pulmonary nodules. No  pleural effusion or pneumothorax.  3.2 cm right thyroid nodule/cyst, chronic.  The heart is top-normal in size. No pericardial effusion. Coronary atherosclerosis.  No suspicious mediastinal, hilar, or axillary lymphadenopathy.  Mild degenerative changes of the thoracic spine. Benign bone island in the right lateral 7th rib (series 2/image 47).  Lytic sternal metastasis with a soft tissue component (series 6/image 71). Lytic metastases involving the left posterolateral 9th and 10th ribs (series 2/ image 51).  Right lateral 9th and 10th rib fractures (series 2/ images 50 and 70), without definite underlying mass, favored to be posttraumatic.  CT ABDOMEN AND PELVIS FINDINGS  Hepatobiliary: Two probable cyst in the right hepatic lobe measuring up to 10 mm (series 2/ image 58), unchanged.  Gallbladder is unremarkable. No intrahepatic or extrahepatic ductal dilatation.  Pancreas: Within normal limits.  Spleen: Within normal limits.  Adrenals/Urinary Tract: Adrenal glands are unremarkable.  Right renal  cysts measuring up to 2.0 cm in the posterior right upper pole (series 2/image 64). Left kidney is within normal limits. No hydronephrosis.  Bladder is within normal limits.  Stomach/Bowel: Stomach is unremarkable.  No evidence of bowel obstruction.  Normal appendix.  Sigmoid diverticulosis, without associated inflammatory changes.  Vascular/Lymphatic: No evidence of abdominal aortic aneurysm.  No suspicious abdominopelvic lymphadenopathy.  Reproductive: Prostatomegaly, with enlargement of the central gland which indents the base of the bladder.  Postsurgical changes related to prior right orchiectomy.  Other: No abdominopelvic ascites.  Musculoskeletal: Degenerative changes of the lumbar spine. Grade 1 anterolisthesis of L5 on S1.  IMPRESSION: Lytic metastases in the sternum and left posterolateral 9th and 10th ribs.  Right lateral 9th and 10th rib fractures, without definite underlying mass, possibly posttraumatic.  These  findings remain concerning for metastatic disease or multiple myeloma.  No definite primary neoplasm is visualized.  Postsurgical changes related to prior right orchiectomy.   Electronically Signed   By: Julian Hy M.D.   On: 02/02/2014 11:11   Nm Pet Image Initial (pi) Whole Body  02/14/2014   CLINICAL DATA:  Initial treatment strategy for multiple lytic osseous lesions. History of testicular cancer status post right orchiectomy and chemotherapy.  EXAM: NUCLEAR MEDICINE PET WHOLE BODY  TECHNIQUE: 9.4 MCi F-18 FDG was injected intravenously. CT data was obtained and used for attenuation correction and anatomic localization only. (This was not acquired as a diagnostic CT examination.) Additional exam technical data entered on technologist worksheet.  FASTING BLOOD GLUCOSE:  Value: 81 mg/dl  COMPARISON:  CT of the chest, abdomen and pelvis 02/02/2014.  FINDINGS: Head/Neck: No hypermetabolic lymph nodes in the neck. Large rim calcified nodule in the right lobe of the thyroid gland measuring 3.7 x 3.0 cm is similar to prior studies, and demonstrates no internal hypermetabolism.  Chest: 9 mm short axis left supraclavicular lymph node is mildly hypermetabolic (SUVmax = 3.4). No other hypermetabolic mediastinal or hilar nodes. No suspicious pulmonary nodules on the CT scan. Heart size is normal. There is no significant pericardial fluid, thickening or pericardial calcification. There is atherosclerosis of the thoracic aorta, the great vessels of the mediastinum and the coronary arteries, including calcified atherosclerotic plaque in the left anterior descending, left circumflex and right coronary arteries.  Abdomen/Pelvis: No abnormal hypermetabolic activity within the liver, pancreas, adrenal glands, or spleen. No hypermetabolic lymph nodes in the abdomen or pelvis. 2.3 cm low-attenuation lesion in the posterior aspect of the upper pole of the right kidney is incompletely characterized on today's non contrast  CT examination, but appeared to be a simple cyst on the prior contrast-enhanced exam. Numerous colonic diverticulae are noted, without surrounding inflammatory changes to suggest an acute diverticulitis at this time. There is a large amount of presumably physiologic activity throughout the distal colon and rectum. However, in the distal rectum (image 216 of series 3) there is some asymmetric somewhat mass-like thickening that is focally hypermetabolic (SUVmax = 20.2), and there is a small amount of hypermetabolism in the anal canal (SUVmax = 7.7). Both of these regions are nonspecific, and could certainly be physiologic. Status post left orchiectomy.  Skeleton: Innumerable lytic lesions throughout the axial skeleton, many of which demonstrate hypermetabolism. Specific examples include a large lytic lesion in the sternum (SUVmax = 4.4)that expansile lytic lesion in the left posterior fifth rib (SUVmax = 4.0), and numerous expansile lytic rib lesions bilaterally. A healing nondisplaced pathologic fracture through the posterolateral aspect of the right ninth rib. Nondisplaced fracture  of the posterior seventh ribs bilaterally, presumably pathologic. Healing nondisplaced pathologic fracture of the posterolateral aspect of the left ninth rib. No definite pathologic compression fractures are identified at this time. Grade 2 anterolisthesis of L5 upon S1 is incidentally noted. Innumerable lytic lesions throughout the skull also noted, but these do not demonstrate clear hypermetabolism. Large lytic lesion in the angle of the mandible on the right side.  Extremeties: There are innumerable tiny lytic lesions throughout the visualized appendicular skeleton, but none of these demonstrate definite hypermetabolism.  IMPRESSION: 1. Innumerable lytic lesions throughout the visualized axial and appendicular skeleton. The appearance is most compatible with multiple myeloma, as detailed above. At this time, there are multiple  pathologic rib fractures, as detailed above. 2. There is a large amount of metabolic activity throughout the distal colon and rectum, the majority which is favored to be physiologic. Some slightly asymmetric somewhat mass like soft tissue thickening is noted in the region of the distal rectum, and there is some focal hypermetabolism in this region, as well as a adjacent anal canal. The possibility of underlying and anorectal neoplasm should be considered, and correlation with physical examination and colonoscopy is suggested in the near future if not already recently performed. 3. Borderline enlarged mildly hypermetabolic left supraclavicular lymph node. 4. Extensive colonic diverticulosis without findings to suggest acute diverticulitis at this time. 5. Additional incidental findings, as above.   Electronically Signed   By: Vinnie Langton M.D.   On: 02/14/2014 14:45   Ct Biopsy  02/12/2014   CLINICAL DATA:  Multiple lytic bone lesions. The patient requires biopsy for diagnostic purposes and also sampling of bone marrow. Lytic lesion of the sternum is present and has been determined to be the single best target for both lesion biopsy and bone marrow sampling.  EXAM: 1. CT GUIDED ASPIRATE AND CORE BIOPSY OF STERNAL BONE MARROW 2. CT-GUIDED CORE BIOPSY OF STERNAL BONE LESION  ANESTHESIA/SEDATION: Versed 2.0 mg IV, Fentanyl 100 mcg IV  Total Moderate Sedation Time  15 minutes.  PROCEDURE: The procedure risks, benefits, and alternatives were explained to the patient. Questions regarding the procedure were encouraged and answered. The patient understands and consents to the procedure.  The chest wall was prepped with Betadine. Sterile gown and sterile gloves were used for the procedure. Local anesthesia was provided with 1% Lidocaine. A time-out procedure was performed.  Under CT guidance, an 11 gauge bone cutting needle was advanced into the sternum. Needle positioning was confirmed with CT. Initial non  heparinized and heparinized aspirate samples were obtained of bone marrow.  Core biopsy was performed through the 11 gauge needle. Core biopsy was performed for marrow analysis as well as histologic analysis. The histologic specimen was placed in formalin.  COMPLICATIONS: None  FINDINGS: Lytic lesion of the sternum causing cortical destruction was targeted. Aspirate and core samples were obtained. There were no immediate complications.  IMPRESSION: CT guided sternal biopsy with samples obtained of a bone lesion as well as sternal bone marrow.   Electronically Signed   By: Aletta Edouard M.D.   On: 02/12/2014 11:28   Ct Maxillofacial W/cm  02/01/2014   ADDENDUM REPORT: 02/01/2014 10:57  ADDENDUM: It should be noted that the soft tissue lesion arising from the left maxillary antrum does represent one of the lytic lesions identified throughout the visualized bony structures.   Electronically Signed   By: Inez Catalina M.D.   On: 02/01/2014 10:57   02/01/2014   CLINICAL DATA:  Abnormal Panorex with  left facial pain  EXAM: CT MAXILLOFACIAL WITH CONTRAST  TECHNIQUE: Multidetector CT imaging of the maxillofacial structures was performed with intravenous contrast. Multiplanar CT image reconstructions were also generated. A small metallic BB was placed on the right temple in order to reliably differentiate right from left.  CONTRAST:  96m OMNIPAQUE IOHEXOL 300 MG/ML  SOLN  COMPARISON:  None.  FINDINGS: Diffuse atrophic changes are noted intracranially. Scattered lytic lesions are noted throughout the bony structures of the maxillofacial bones as well as the visualized portions of the skull. These are most notable in the region of the maxillary antra bilaterally particularly on the left. A 2.5 x 2.2 cm enhancing soft tissue mass lesion is in noted distorting the left maxillary antrum and causing erosive changes of the lateral and inferior walls. This extends into the maxilla and there is suspicious for erosion of  underlying molar root. This particular lesion corresponds to an area of pain. A similar but smaller lesion is noted involving the inferior aspect of the right maxillary antrum. Involvement of the cervical spine is noted as well with multiple lytic lesions identified.  The orbits and their contents are within normal limits. No significant lymphadenopathy is noted.  IMPRESSION: Multiple lytic lesions involving the bony structures most prominent in the region of the left maxillary antrum as described. Differential includes multiple myeloma as well as metastatic disease. Further evaluation with laboratory testing as well as CT of the chest abdomen pelvis with contrast is recommended to rule out an underlying primary neoplasm.  These results will be called to the ordering clinician or representative by the Radiologist Assistant, and communication documented in the PACS or zVision Dashboard.  Electronically Signed: By: MInez CatalinaM.D. On: 02/01/2014 10:22    ASSESSMENT & PLAN:  Multiple myeloma We discussed a challenging situation with him having concurrent diagnosis of multiple myeloma and amyloidosis at the same time. The patient would definitely need second opinion/bone marrow transplant evaluation. [facility]. We discussed importance of induction chemotherapy prior to bone marrow transplant. I recommend regimen involving Cytoxan, dexamethasone and Velcade that works well for both multiple myeloma and amyloidosis. We discussed the role of chemotherapy. The intent is for palliative.  We discussed some of the risks, benefits, side-effects of Cytoxan, Bortezemib and Dexamethasone.   Some of the short term side-effects included, though not limited to, risk of fatigue, risk of allergic reactions, mouth sores, weight loss, pancytopenia, life-threatening infections, need for transfusions of blood products, nausea,  vomiting, change in bowel habits, blood clots, admission to hospital for various reasons, and risks of death.   Long term side-effects are also discussed including risks of infertility, permanent damage to nerve function, chronic fatigue, and rare secondary malignancy including bone marrow disorders and leukemia.   The patient is aware that the response rates discussed earlier is not guaranteed.  After a long discussion, patient made an informed decision to proceed with the prescribed plan of care. The patient will need intravenous bisphosphonate. I will proceed with that after we obtain dental clearance. In the meantime, I recommend vitamin D and calcium supplement. The patient is warned about risk of severe hypoglycemia due to future treatment with dexamethasone.   Amyloidosis I will order cardiac biomarkers in the future. [facility]. 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 As mentioned above, combination therapy with Cytoxan, dexamethasone and Velcade is an approved regimen for amyloidosis. The dosing for amyloidosis is quite different compared to the dosing for multiple myeloma. Due to significant presence of plasma cells in his bone marrow, I recommend treating him aggressively with twice-weekly regimen of Velcade rather than weekly. 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  Coronary artery calcification seen on CAT scan This patient have significant risk factors for coronary artery disease. I recommend he starts aspirin therapy. As mentioned above, I will refer him to cardiologist for medical management and to exclude cardiac involvement by amyloidosis  Diabetes mellitus without  complication He is at significant risk of severe hypoglycemia due to future treatment with dexamethasone. We discussed importance of close follow-up with PCP for diabetes management during treatment  Lesion of mandible The patient would be candidate for IV bisphosphonate. I am concerned about the mandible lesion and risk of osteonecrosis of the jaw. I recommend he returns to his dentist for dental clearance prior to starting him on treatment with Zometa.     Orders Placed This Encounter  Procedures  . CBC with Differential    Standing Status: Standing     Number of Occurrences: 9     Standing Expiration Date: 02/23/2015  . Comprehensive metabolic panel    Standing Status: Standing     Number of Occurrences: 9     Standing Expiration Date: 02/23/2015  . Uric Acid    Standing Status: Standing     Number of Occurrences: 2     Standing Expiration Date: 02/23/2015  . Lactate dehydrogenase    Standing Status: Standing     Number of Occurrences: 2     Standing Expiration Date: 02/23/2015  . CBC with Differential    Standing Status: Standing     Number of Occurrences: 20     Standing Expiration Date: 02/23/2015  . Comprehensive metabolic panel    Standing Status: Standing     Number of Occurrences: 20     Standing Expiration Date: 02/23/2015  . Troponin T    Standing Status: Future     Number of Occurrences:      Standing Expiration Date: 02/23/2015  . BNP (Brain natriuretic peptide)    Standing Status: Future     Number of Occurrences:      Standing Expiration Date: 02/22/2015  . Ambulatory referral to Cardiology    Referral Priority:  Routine    Referral Type:  Consultation    Referral Reason:  Specialty Services Required    Referred to Provider:  Dorothy Spark, MD    Requested Specialty:  Cardiology    Number of Visits Requested:  1    All questions were answered. The patient knows to call the clinic with any problems, questions or concerns. I spent 60 minutes counseling  the patient face to face. The total time spent in the appointment was 80 minutes and more than 50% was on counseling.     Dontrail Blackwell, MD 02/22/2014 10:09 PM

## 2014-02-22 NOTE — Assessment & Plan Note (Signed)
This patient have significant risk factors for coronary artery disease. I recommend he starts aspirin therapy. As mentioned above, I will refer him to cardiologist for medical management and to exclude cardiac involvement by amyloidosis

## 2014-02-23 ENCOUNTER — Telehealth: Payer: Self-pay | Admitting: *Deleted

## 2014-02-23 ENCOUNTER — Ambulatory Visit: Payer: BC Managed Care – PPO | Admitting: Hematology and Oncology

## 2014-02-23 LAB — CHROMOSOME ANALYSIS, BONE MARROW

## 2014-02-23 MED ORDER — HYDROCODONE-HOMATROPINE 5-1.5 MG/5ML PO SYRP: 5.0000 mL | ORAL_SOLUTION | Freq: Four times a day (QID) | ORAL | Status: DC | PRN

## 2014-02-23 NOTE — Telephone Encounter (Signed)
Informed pt of Cytoxan ready to pick up at [facility].  Gave him directions and informed opened til 7pm.  Informed him of $40 co pay.  Reviewed taking 6 pills on Monday morning and reviewed his chemo schedule.   He verbalized understanding.

## 2014-02-26 ENCOUNTER — Ambulatory Visit (HOSPITAL_BASED_OUTPATIENT_CLINIC_OR_DEPARTMENT_OTHER): Payer: BC Managed Care – PPO

## 2014-02-26 ENCOUNTER — Ambulatory Visit: Payer: BC Managed Care – PPO | Admitting: Internal Medicine

## 2014-02-26 DIAGNOSIS — C9000 Multiple myeloma not having achieved remission: Secondary | ICD-10-CM

## 2014-02-26 DIAGNOSIS — Z5112 Encounter for antineoplastic immunotherapy: Secondary | ICD-10-CM

## 2014-02-26 MED ORDER — ONDANSETRON HCL 8 MG PO TABS
8.0000 mg | ORAL_TABLET | Freq: Once | ORAL | Status: AC
  Administered 2014-02-26: 8 mg via ORAL

## 2014-02-26 MED ORDER — ONDANSETRON HCL 8 MG PO TABS
ORAL_TABLET | ORAL | Status: AC
  Filled 2014-02-26: qty 1

## 2014-02-26 MED ORDER — BORTEZOMIB CHEMO SQ INJECTION 3.5 MG (2.5MG/ML)
1.3000 mg/m2 | Freq: Once | INTRAMUSCULAR | Status: AC
  Administered 2014-02-26: 2.75 mg via SUBCUTANEOUS
  Filled 2014-02-26: qty 2.75

## 2014-02-26 NOTE — Patient Instructions (Signed)
Bortezomib injection What is this medicine? BORTEZOMIB (bor TEZ oh mib) is a chemotherapy drug. It slows the growth of cancer cells. This medicine is used to treat multiple myeloma, and certain lymphomas, such as mantle-cell lymphoma. This medicine may be used for other purposes; ask your health care provider or pharmacist if you have questions. COMMON BRAND NAME(S): Velcade What should I tell my health care provider before I take this medicine? They need to know if you have any of these conditions: -diabetes -heart disease -irregular heartbeat -liver disease -on hemodialysis -low blood counts, like low white blood cells, platelets, or hemoglobin -peripheral neuropathy -taking medicine for blood pressure -an unusual or allergic reaction to bortezomib, mannitol, boron, other medicines, foods, dyes, or preservatives -pregnant or trying to get pregnant -breast-feeding How should I use this medicine? This medicine is for injection into a vein or for injection under the skin. It is given by a health care professional in a hospital or clinic setting. Talk to your pediatrician regarding the use of this medicine in children. Special care may be needed. Overdosage: If you think you have taken too much of this medicine contact a poison control center or emergency room at once. NOTE: This medicine is only for you. Do not share this medicine with others. What if I miss a dose? It is important not to miss your dose. Call your doctor or health care professional if you are unable to keep an appointment. What may interact with this medicine? This medicine may interact with the following medications: -ketoconazole -rifampin -ritonavir -St. John's Wort This list may not describe all possible interactions. Give your health care provider a list of all the medicines, herbs, non-prescription drugs, or dietary supplements you use. Also tell them if you smoke, drink alcohol, or use illegal drugs. Some items  may interact with your medicine. What should I watch for while using this medicine? Visit your doctor for checks on your progress. This drug may make you feel generally unwell. This is not uncommon, as chemotherapy can affect healthy cells as well as cancer cells. Report any side effects. Continue your course of treatment even though you feel ill unless your doctor tells you to stop. You may get drowsy or dizzy. Do not drive, use machinery, or do anything that needs mental alertness until you know how this medicine affects you. Do not stand or sit up quickly, especially if you are an older patient. This reduces the risk of dizzy or fainting spells. In some cases, you may be given additional medicines to help with side effects. Follow all directions for their use. Call your doctor or health care professional for advice if you get a fever, chills or sore throat, or other symptoms of a cold or flu. Do not treat yourself. This drug decreases your body's ability to fight infections. Try to avoid being around people who are sick. This medicine may increase your risk to bruise or bleed. Call your doctor or health care professional if you notice any unusual bleeding. You may need blood work done while you are taking this medicine. In some patients, this medicine may cause a serious brain infection that may cause death. If you have any problems seeing, thinking, speaking, walking, or standing, tell your doctor right away. If you cannot reach your doctor, urgently seek other source of medical care. Do not become pregnant while taking this medicine. Women should inform their doctor if they wish to become pregnant or think they might be pregnant. There is   a potential for serious side effects to an unborn child. Talk to your health care professional or pharmacist for more information. Do not breast-feed an infant while taking this medicine. Check with your doctor or health care professional if you get an attack of  severe diarrhea, nausea and vomiting, or if you sweat a lot. The loss of too much body fluid can make it dangerous for you to take this medicine. What side effects may I notice from receiving this medicine? Side effects that you should report to your doctor or health care professional as soon as possible: -allergic reactions like skin rash, itching or hives, swelling of the face, lips, or tongue -breathing problems -changes in hearing -changes in vision -fast, irregular heartbeat -feeling faint or lightheaded, falls -pain, tingling, numbness in the hands or feet -right upper belly pain -seizures -swelling of the ankles, feet, hands -unusual bleeding or bruising -unusually weak or tired -vomiting -yellowing of the eyes or skin Side effects that usually do not require medical attention (report to your doctor or health care professional if they continue or are bothersome): -changes in emotions or moods -constipation -diarrhea -loss of appetite -headache -irritation at site where injected -nausea This list may not describe all possible side effects. Call your doctor for medical advice about side effects. You may report side effects to FDA at 1-800-FDA-1088. Where should I keep my medicine? This drug is given in a hospital or clinic and will not be stored at home. NOTE: This sheet is a summary. It may not cover all possible information. If you have questions about this medicine, talk to your doctor, pharmacist, or health care provider.  2015, Elsevier/Gold Standard. (2013-01-02 12:46:32)   Cyclophosphamide tablets What is this medicine? CYCLOPHOSPHAMIDE (sye kloe FOSS fa mide) is a chemotherapy drug. It slows the growth of cancer cells. This medicine is used to treat many types of cancer like lymphoma, myeloma, leukemia, breast cancer, and ovarian cancer, to name a few. It is also used to treat nephrotic syndrome in children. This medicine may be used for other purposes; ask your  health care provider or pharmacist if you have questions. COMMON BRAND NAME(S): Cytoxan What should I tell my health care provider before I take this medicine? They need to know if you have any of these conditions: -blood disorders -infection -kidney disease -liver disease -recent or ongoing radiation therapy -an unusual or allergic reaction to cyclophosphamide, other chemotherapy, other medicines, foods, dyes, or preservatives -pregnant or trying to get pregnant -breast-feeding How should I use this medicine? Take this medicine by mouth with a glass of water. Follow the directions on the prescription label. Do not cut, crush or chew this medicine. Take your medicine at regular intervals. Do not take it more often than directed. Do not stop taking except on your doctor's advice. Talk to your pediatrician regarding the use of this medicine in children. Special care may be needed. Overdosage: If you think you have taken too much of this medicine contact a poison control center or emergency room at once. NOTE: This medicine is only for you. Do not share this medicine with others. What if I miss a dose? If you miss a dose, take it as soon as you can. If it is almost time for your next dose, take only that dose. Do not take double or extra doses. What may interact with this medicine? This medicine may interact with the following medications: -amiodarone -amphotericin B -azathioprine -certain antiviral medicines for HIV or AIDS such  as protease inhibitors (e.g., indinavir, ritonavir) and zidovudine -certain blood pressure medications such as benazepril, captopril, enalapril, fosinopril, lisinopril, moexipril, monopril, perindopril, quinapril, ramipril, trandolapril -certain cancer medications such as anthracyclines (e.g., daunorubicin, doxorubicin), busulfan, cytarabine, paclitaxel, pentostatin, tamoxifen, trastuzumab -certain diuretics such as chlorothiazide, chlorthalidone,  hydrochlorothiazide, indapamide, metolazone -certain medicines that treat or prevent blood clots like warfarin -certain muscle relaxants such as succinylcholine -cyclosporine -etanercept -indomethacin -medicines to increase blood counts like filgrastim, pegfilgrastim, sargramostim -medicines used as general anesthesia -metronidazole -natalizumab This list may not describe all possible interactions. Give your health care provider a list of all the medicines, herbs, non-prescription drugs, or dietary supplements you use. Also tell them if you smoke, drink alcohol, or use illegal drugs. Some items may interact with your medicine. What should I watch for while using this medicine? Visit your doctor for checks on your progress. This drug may make you feel generally unwell. This is not uncommon, as chemotherapy can affect healthy cells as well as cancer cells. Report any side effects. Continue your course of treatment even though you feel ill unless your doctor tells you to stop. Drink water or other fluids as directed. Urinate often, even at night. In some cases, you may be given additional medicines to help with side effects. Follow all directions for their use. Call your doctor or health care professional for advice if you get a fever, chills or sore throat, or other symptoms of a cold or flu. Do not treat yourself. This drug decreases your body's ability to fight infections. Try to avoid being around people who are sick. This medicine may increase your risk to bruise or bleed. Call your doctor or health care professional if you notice any unusual bleeding. Be careful brushing and flossing your teeth or using a toothpick because you may get an infection or bleed more easily. If you have any dental work done, tell your dentist you are receiving this medicine. You may get drowsy or dizzy. Do not drive, use machinery, or do anything that needs mental alertness until you know how this medicine affects  you. Do not become pregnant while taking this medicine or for 1 year after stopping it. Women should inform their doctor if they wish to become pregnant or think they might be pregnant. Men should not father a child while taking this medicine and for 4 months after stopping it. There is a potential for serious side effects to an unborn child. Talk to your health care professional or pharmacist for more information. Do not breast-feed an infant while taking this medicine. This medicine may interfere with the ability to have a child. This medicine has caused ovarian failure in some women. This medicine has caused reduced sperm counts in some men. You should talk with your doctor or health care professional if you are concerned about your fertility. If you are going to have surgery, tell your doctor or health care professional that you have taken this medicine. What side effects may I notice from receiving this medicine? Side effects that you should report to your doctor or health care professional as soon as possible: -allergic reactions like skin rash, itching or hives, swelling of the face, lips, or tongue -low blood counts - this medicine may decrease the number of white blood cells, red blood cells and platelets. You may be at increased risk for infections and bleeding. -signs of infection - fever or chills, cough, sore throat, pain or difficulty passing urine -signs of decreased platelets or bleeding - bruising,  pinpoint red spots on the skin, black, tarry stools, blood in the urine -signs of decreased red blood cells - unusually weak or tired, fainting spells, lightheadedness -breathing problems -dark urine -dizziness -palpitations -swelling of the ankles, feet, hands -trouble passing urine or change in the amount of urine -weight gain -yellowing of the eyes or skin Side effects that usually do not require medical attention (report to your doctor or health care professional if they continue or  are bothersome): -changes in nail or skin color -hair loss -missed menstrual periods -mouth sores -nausea, vomiting This list may not describe all possible side effects. Call your doctor for medical advice about side effects. You may report side effects to FDA at 1-800-FDA-1088. Where should I keep my medicine? Keep out of the reach of children. Store at room temperature at or below 25 degrees C (77 degrees F). This medicine can be stored at room temperatures of up to 30 degrees C (86 degrees F) for a short time. Protect from temperatures above 30 degrees C (86 degrees F). Throw away any unused medicine after the expiration date. NOTE: This sheet is a summary. It may not cover all possible information. If you have questions about this medicine, talk to your doctor, pharmacist, or health care provider.  2015, Elsevier/Gold Standard. (2012-01-22 16:42:57)

## 2014-02-27 ENCOUNTER — Telehealth: Payer: Self-pay | Admitting: *Deleted

## 2014-02-27 NOTE — Telephone Encounter (Signed)
-----   Message from Gabriel Earing, RN sent at 02/26/2014 12:19 PM EST ----- Regarding: chemo follow up call First time Velcade and oral Cytoxan. Dr Alvy Bimler.  Thanks

## 2014-02-27 NOTE — Telephone Encounter (Signed)
Called patient to follow up on chemo.States he did well. Blood sugar went up as expected. Has f/u with PCP soon. Will call if has any problems

## 2014-03-01 ENCOUNTER — Ambulatory Visit (HOSPITAL_BASED_OUTPATIENT_CLINIC_OR_DEPARTMENT_OTHER): Payer: BC Managed Care – PPO

## 2014-03-01 DIAGNOSIS — C9000 Multiple myeloma not having achieved remission: Secondary | ICD-10-CM

## 2014-03-01 DIAGNOSIS — Z5112 Encounter for antineoplastic immunotherapy: Secondary | ICD-10-CM

## 2014-03-01 MED ORDER — BORTEZOMIB CHEMO SQ INJECTION 3.5 MG (2.5MG/ML)
1.3000 mg/m2 | Freq: Once | INTRAMUSCULAR | Status: AC
  Administered 2014-03-01: 2.75 mg via SUBCUTANEOUS
  Filled 2014-03-01: qty 2.75

## 2014-03-01 MED ORDER — ONDANSETRON HCL 8 MG PO TABS
8.0000 mg | ORAL_TABLET | Freq: Once | ORAL | Status: AC
  Administered 2014-03-01: 8 mg via ORAL

## 2014-03-01 MED ORDER — ONDANSETRON HCL 8 MG PO TABS
ORAL_TABLET | ORAL | Status: AC
  Filled 2014-03-01: qty 1

## 2014-03-01 NOTE — Patient Instructions (Signed)
[facility] Discharge Instructions for Patients Receiving Chemotherapy  Today you received the following chemotherapy agents Velcade.  To help prevent nausea and vomiting after your treatment, we encourage you to take your nausea medication as directed.    If you develop nausea and vomiting that is not controlled by your nausea medication, call the clinic.   BELOW ARE SYMPTOMS THAT SHOULD BE REPORTED IMMEDIATELY:  *FEVER GREATER THAN 100.5 F  *CHILLS WITH OR WITHOUT FEVER  NAUSEA AND VOMITING THAT IS NOT CONTROLLED WITH YOUR NAUSEA MEDICATION  *UNUSUAL SHORTNESS OF BREATH  *UNUSUAL BRUISING OR BLEEDING  TENDERNESS IN MOUTH AND THROAT WITH OR WITHOUT PRESENCE OF ULCERS  *URINARY PROBLEMS  *BOWEL PROBLEMS  UNUSUAL RASH Items with * indicate a potential emergency and should be followed up as soon as possible.  Feel free to call the clinic you have any questions or concerns. The clinic phone number is [phone].    

## 2014-03-02 ENCOUNTER — Telehealth: Payer: Self-pay | Admitting: *Deleted

## 2014-03-02 NOTE — Telephone Encounter (Signed)
SLIGHTLY RAISED AND RED RASH [2"X 1-1/2"] ON ABDOMEN. THERE IS SOME ITCHING AT TIMES. NO OTHER SYMPTOMS OR CONCERNS. THIS NOTE ROUTED TO [place].

## 2014-03-05 ENCOUNTER — Telehealth: Payer: Self-pay | Admitting: Hematology and Oncology

## 2014-03-05 ENCOUNTER — Other Ambulatory Visit (HOSPITAL_BASED_OUTPATIENT_CLINIC_OR_DEPARTMENT_OTHER): Payer: BC Managed Care – PPO

## 2014-03-05 ENCOUNTER — Encounter: Payer: Self-pay | Admitting: Hematology and Oncology

## 2014-03-05 ENCOUNTER — Ambulatory Visit (HOSPITAL_BASED_OUTPATIENT_CLINIC_OR_DEPARTMENT_OTHER): Payer: BC Managed Care – PPO | Admitting: Hematology and Oncology

## 2014-03-05 ENCOUNTER — Ambulatory Visit (HOSPITAL_BASED_OUTPATIENT_CLINIC_OR_DEPARTMENT_OTHER): Payer: BC Managed Care – PPO

## 2014-03-05 VITALS — BP 127/72 | HR 78 | Temp 98.4°F | Resp 18 | Ht 72.0 in | Wt 190.9 lb

## 2014-03-05 DIAGNOSIS — M279 Disease of jaws, unspecified: Secondary | ICD-10-CM

## 2014-03-05 DIAGNOSIS — K089 Disorder of teeth and supporting structures, unspecified: Secondary | ICD-10-CM

## 2014-03-05 DIAGNOSIS — Z5112 Encounter for antineoplastic immunotherapy: Secondary | ICD-10-CM

## 2014-03-05 DIAGNOSIS — C9000 Multiple myeloma not having achieved remission: Secondary | ICD-10-CM

## 2014-03-05 DIAGNOSIS — I2510 Atherosclerotic heart disease of native coronary artery without angina pectoris: Secondary | ICD-10-CM

## 2014-03-05 DIAGNOSIS — K649 Unspecified hemorrhoids: Secondary | ICD-10-CM

## 2014-03-05 DIAGNOSIS — E859 Amyloidosis, unspecified: Secondary | ICD-10-CM

## 2014-03-05 DIAGNOSIS — K648 Other hemorrhoids: Secondary | ICD-10-CM

## 2014-03-05 DIAGNOSIS — E119 Type 2 diabetes mellitus without complications: Secondary | ICD-10-CM

## 2014-03-05 LAB — CBC WITH DIFFERENTIAL/PLATELET
BASO%: 0.4 % (ref 0.0–2.0)
BASOS ABS: 0 10*3/uL (ref 0.0–0.1)
EOS%: 2.6 % (ref 0.0–7.0)
Eosinophils Absolute: 0.2 10*3/uL (ref 0.0–0.5)
HEMATOCRIT: 41.3 % (ref 38.4–49.9)
HEMOGLOBIN: 13.4 g/dL (ref 13.0–17.1)
LYMPH%: 13.7 % — AB (ref 14.0–49.0)
MCH: 29.6 pg (ref 27.2–33.4)
MCHC: 32.4 g/dL (ref 32.0–36.0)
MCV: 91.5 fL (ref 79.3–98.0)
MONO#: 0.4 10*3/uL (ref 0.1–0.9)
MONO%: 5.5 % (ref 0.0–14.0)
NEUT%: 77.8 % — AB (ref 39.0–75.0)
NEUTROS ABS: 5.4 10*3/uL (ref 1.5–6.5)
PLATELETS: 203 10*3/uL (ref 140–400)
RBC: 4.51 10*6/uL (ref 4.20–5.82)
RDW: 14.6 % (ref 11.0–14.6)
WBC: 7 10*3/uL (ref 4.0–10.3)
lymph#: 1 10*3/uL (ref 0.9–3.3)

## 2014-03-05 LAB — COMPREHENSIVE METABOLIC PANEL (CC13)
ALT: 54 U/L (ref 0–55)
ANION GAP: 11 meq/L (ref 3–11)
AST: 36 U/L — ABNORMAL HIGH (ref 5–34)
Albumin: 4 g/dL (ref 3.5–5.0)
Alkaline Phosphatase: 100 U/L (ref 40–150)
BILIRUBIN TOTAL: 0.89 mg/dL (ref 0.20–1.20)
BUN: 20.7 mg/dL (ref 7.0–26.0)
CALCIUM: 9.5 mg/dL (ref 8.4–10.4)
CO2: 24 mEq/L (ref 22–29)
CREATININE: 1.3 mg/dL (ref 0.7–1.3)
Chloride: 100 mEq/L (ref 98–109)
EGFR: 59 mL/min/{1.73_m2} — ABNORMAL LOW (ref >90)
Glucose: 121 mg/dl (ref 70–140)
Potassium: 4 mEq/L (ref 3.5–5.1)
Sodium: 135 mEq/L — ABNORMAL LOW (ref 136–145)
Total Protein: 8 g/dL (ref 6.4–8.3)

## 2014-03-05 LAB — LACTATE DEHYDROGENASE (CC13): LDH: 143 U/L (ref 125–245)

## 2014-03-05 LAB — URIC ACID (CC13): Uric Acid, Serum: 7.6 mg/dl — ABNORMAL HIGH (ref 2.6–7.4)

## 2014-03-05 LAB — BRAIN NATRIURETIC PEPTIDE: Brain Natriuretic Peptide: 5.1 pg/mL (ref 0.0–100.0)

## 2014-03-05 MED ORDER — BORTEZOMIB CHEMO SQ INJECTION 3.5 MG (2.5MG/ML)
1.3000 mg/m2 | Freq: Once | INTRAMUSCULAR | Status: AC
  Administered 2014-03-05: 2.75 mg via SUBCUTANEOUS
  Filled 2014-03-05: qty 2.75

## 2014-03-05 MED ORDER — ONDANSETRON HCL 8 MG PO TABS
ORAL_TABLET | ORAL | Status: AC
Start: 2014-03-05 — End: 2014-03-05
  Filled 2014-03-05: qty 1

## 2014-03-05 MED ORDER — ONDANSETRON HCL 8 MG PO TABS
8.0000 mg | ORAL_TABLET | Freq: Once | ORAL | Status: AC
  Administered 2014-03-05: 8 mg via ORAL

## 2014-03-05 NOTE — Assessment & Plan Note (Signed)
This could be exacerbated by recent aspirin therapy. I recommend increase hygiene, and to add stool softener. The results of the PET CT scan show increased activity but I suspect it could be just due to hemorrhoids. I will repeat her PET/CT scan for evaluation in the future in about 3-4 months. If the abnormal lesion is persistent, we will refer him to see Gen. surgery.

## 2014-03-05 NOTE — Assessment & Plan Note (Signed)
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 The patient would definitely need second opinion/bone marrow transplant evaluation. He has an appointment to see physicians at [facility] tomorrow. We discussed importance of induction chemotherapy prior to bone marrow transplant. I recommend regimen involving Cytoxan, dexamethasone and Velcade that works well for both multiple myeloma and amyloidosis. He tolerated treatment well. He will continue dexamethasone 20 mg weekly, Cytoxan 300 mg weekly and Velcade on days 1, 4, 7 and 11. I'm waiting for dental clearance to proceed with Zometa. In the meantime, he will continue on vitamin D and calcium supplements

## 2014-03-05 NOTE — Telephone Encounter (Signed)
Gave avs & cal for Dec. Sent mess to adjust tx to after MD appt.

## 2014-03-05 NOTE — Assessment & Plan Note (Signed)
His blood sugar was high for several days after treatment. I recommend he increased metformin to twice a day 3 days after treatment and to increase Lantus to 6 units for 3 days after treatment. 

## 2014-03-05 NOTE — Assessment & Plan Note (Signed)
The patient would be candidate for IV bisphosphonate. I am concerned about the mandible lesion and risk of osteonecrosis of the jaw. I recommend he returns to his dentist for dental clearance prior to starting him on treatment with Zometa.

## 2014-03-05 NOTE — Assessment & Plan Note (Signed)
I will order cardiac biomarkers in the future. I would recommend sending his tissue block to [facility] for mass spectrometry for tissue subtype. 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 As mentioned above, combination therapy with Cytoxan, dexamethasone and Velcade is an approved regimen for amyloidosis. The dosing for amyloidosis is quite different compared to the dosing for multiple myeloma. Due to significant presence of plasma cells in his bone marrow, I recommend treating him aggressively with twice-weekly regimen of Velcade rather than weekly. I recommend cardiology evaluation to exclude cardiac amyloidosis as this would impact decision for bone marrow transplant in the future 

## 2014-03-05 NOTE — Patient Instructions (Signed)
[facility] Discharge Instructions for Patients Receiving Chemotherapy  Today you received the following chemotherapy agents: Velcade. To help prevent nausea and vomiting after your treatment, we encourage you to take your nausea medication.  If you develop nausea and vomiting that is not controlled by your nausea medication, call the clinic.   BELOW ARE SYMPTOMS THAT SHOULD BE REPORTED IMMEDIATELY:  *FEVER GREATER THAN 100.5 F  *CHILLS WITH OR WITHOUT FEVER  NAUSEA AND VOMITING THAT IS NOT CONTROLLED WITH YOUR NAUSEA MEDICATION  *UNUSUAL SHORTNESS OF BREATH  *UNUSUAL BRUISING OR BLEEDING  TENDERNESS IN MOUTH AND THROAT WITH OR WITHOUT PRESENCE OF ULCERS  *URINARY PROBLEMS  *BOWEL PROBLEMS  UNUSUAL RASH Items with * indicate a potential emergency and should be followed up as soon as possible.  Feel free to call the clinic you have any questions or concerns. The clinic phone number is [phone].    

## 2014-03-05 NOTE — Assessment & Plan Note (Signed)
This patient have significant risk factors for coronary artery disease. I recommend he starts aspirin therapy and he is compliant. As mentioned above, I will refer him to cardiologist for medical management and to exclude cardiac involvement by amyloidosis

## 2014-03-05 NOTE — Progress Notes (Signed)
[facility] OFFICE PROGRESS NOTE  Patient Care Team: Hulan Fess, MD as PCP - General (Family Medicine) Heath Lark, MD as Consulting Physician (Hematology and Oncology)  SUMMARY OF ONCOLOGIC HISTORY: Oncology History   ISS Staging II, M-spike 1.7, IgG 2420, kappa 30.1, beta36mcroglobulin 4.48, Creatinine 1.2, calcium 9.8, hemoglobin 13.2, albumin 4.2     Multiple myeloma   01/10/2003 Pathology Results [id number] pathology showed pure seminoma with lymphovascular invasion, pT2 NX MX   01/10/2003 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 congo red staing reveals amyloid deposition within some of the amorphous material and within blood vessels. Normal cytogenetics.   02/14/2014 Imaging PET CT scan showed diffuse skeletal involvement   02/26/2014 -  Chemotherapy He is started on chemotherapy with Velcade, Cytoxan and dexamethasone.    INTERVAL HISTORY: Please see below for problem oriented charting. He is seen prior to week 2 of therapy. So far, he is tolerating treatment well apart from some very mild skin rash related to Velcade. He also had increased hemorrhoidal bleeding. He denies mouth sores, nausea or vomiting. His wife is concerned about the report of PET scan.   REVIEW OF SYSTEMS:   Constitutional: Denies fevers, chills or abnormal weight loss Eyes: Denies blurriness of vision Ears, nose, mouth, throat, and face: Denies mucositis or sore throat Respiratory: Denies cough, dyspnea or wheezes Cardiovascular: Denies  palpitation, chest discomfort or lower extremity swelling Gastrointestinal:  Denies nausea, heartburn or change in bowel habits Lymphatics: Denies new lymphadenopathy or easy bruising Neurological:Denies numbness, tingling or new weaknesses Behavioral/Psych: Mood is stable, no new changes  All other systems were reviewed with the patient and are negative.  I have reviewed the past medical history, past surgical history, social history and family history with the patient and they are unchanged from previous note.  ALLERGIES:  has No Known Allergies.  MEDICATIONS:  Current Outpatient Prescriptions  Medication Sig Dispense Refill  . acyclovir (ZOVIRAX) 400 MG tablet Take 1 tablet (400 mg total) by mouth daily. 60 tablet 3  . BAYER CONTOUR NEXT TEST test strip   5  . cyclophosphamide (CYTOXAN) 50 MG tablet Take 6 tablets on days 1,8,15 of chemo 1hr before or 2hr after meals every 21d. 60 tablet 3  . dexamethasone (DECADRON) 4 MG tablet Take 10 pills every Monday with breakfast 90 tablet 1  . guaiFENesin-codeine 100-10 MG/5ML syrup Take 5 mLs by mouth every 6 (six) hours as needed for cough. 473 mL 0  . insulin glargine (LANTUS) 100 UNIT/ML injection Inject 5 Units into the skin at bedtime.    . Magnesium 400 MG TABS Take 400 mg by mouth daily.    . metFORMIN (GLUCOPHAGE) 500 MG tablet Take 500 mg by mouth 2 (two) times daily with a meal.    . Multiple Vitamin (MULTIVITAMIN) tablet Take 1 tablet by mouth daily.    . naproxen sodium (ANAPROX) 220 MG tablet Take 220 mg by mouth as needed.    .Marland Kitchenomeprazole (PRILOSEC) 20 MG capsule Take 20 mg by mouth daily.    . ondansetron (ZOFRAN) 8 MG tablet Take 1  tablet (8 mg total) by mouth every 8 (eight) hours as needed. 30 tablet 1  . oxyCODONE-acetaminophen (PERCOCET/ROXICET) 5-325 MG per tablet Take 1 tablet by mouth every 4 (four) hours as needed for severe pain. 30 tablet 0  . pravastatin (PRAVACHOL) 40 MG tablet Take 40 mg by mouth. Daily  1  .  psyllium (REGULOID) 0.52 G capsule Take 5 capsules by mouth daily.    . prochlorperazine (COMPAZINE) 10 MG tablet Take 1 tablet (10 mg total) by mouth every 6 (six) hours as needed (Nausea or vomiting). (Patient not taking: Reported on 03/05/2014) 30 tablet 1   No current facility-administered medications for this visit.   Facility-Administered Medications Ordered in Other Visits  Medication Dose Route Frequency Provider Last Rate Last Dose  . bortezomib SQ (VELCADE) chemo injection 2.75 mg  1.3 mg/m2 (Treatment Plan Actual) Subcutaneous Once Heath Lark, MD        PHYSICAL EXAMINATION: ECOG PERFORMANCE STATUS: 1 - Symptomatic but completely ambulatory  Filed Vitals:   03/05/14 1015  BP: 127/72  Pulse: 78  Temp: 98.4 F (36.9 C)  Resp: 18   Filed Weights   03/05/14 1015  Weight: 190 lb 14.4 oz (86.592 kg)    GENERAL:alert, no distress and comfortable SKIN: The skin rashes consider of mild skin reaction from Velcade. No evidence of cellulitis EYES: normal, Conjunctiva are pink and non-injected, sclera clear OROPHARYNX:no exudate, no erythema and lips, buccal mucosa, and tongue normal  Musculoskeletal:no cyanosis of digits and no clubbing  NEURO: alert & oriented x 3 with fluent speech, no focal motor/sensory deficits  LABORATORY DATA:  I have reviewed the data as listed    Component Value Date/Time   NA 135* 03/05/2014 0959   NA 125* 02/17/2012 1720   K 4.0 03/05/2014 0959   K 4.3 02/17/2012 1720   CL 90* 02/17/2012 1720   CO2 24 03/05/2014 0959   CO2 25 02/17/2012 1720   GLUCOSE 121 03/05/2014 0959   GLUCOSE 474* 02/17/2012 1720   BUN 20.7 03/05/2014 0959   BUN 14 02/17/2012 1720   CREATININE 1.3 03/05/2014 0959   CREATININE 1.10 02/17/2012 1720   CALCIUM 9.5 03/05/2014 0959   CALCIUM 9.6 02/17/2012 1720   PROT 8.0 03/05/2014 0959   ALBUMIN 4.0 03/05/2014 0959   AST 36* 03/05/2014 0959   ALT 54 03/05/2014 0959   ALKPHOS 100 03/05/2014 0959   BILITOT 0.89  03/05/2014 0959   GFRNONAA 72* 02/17/2012 1720   GFRAA 83* 02/17/2012 1720    No results found for: SPEP, UPEP  Lab Results  Component Value Date   WBC 7.0 03/05/2014   NEUTROABS 5.4 03/05/2014   HGB 13.4 03/05/2014   HCT 41.3 03/05/2014   MCV 91.5 03/05/2014   PLT 203 03/05/2014      Chemistry      Component Value Date/Time   NA 135* 03/05/2014 0959   NA 125* 02/17/2012 1720   K 4.0 03/05/2014 0959   K 4.3 02/17/2012 1720   CL 90* 02/17/2012 1720   CO2 24 03/05/2014 0959   CO2 25 02/17/2012 1720   BUN 20.7 03/05/2014 0959   BUN 14 02/17/2012 1720   CREATININE 1.3 03/05/2014 0959   CREATININE 1.10 02/17/2012 1720      Component Value Date/Time   CALCIUM 9.5 03/05/2014 0959   CALCIUM 9.6 02/17/2012 1720   ALKPHOS 100 03/05/2014 0959   AST 36* 03/05/2014 0959   ALT 54 03/05/2014 0959   BILITOT 0.89 03/05/2014 0959  RADIOGRAPHIC STUDIES: I reviewed the imaging with the patient and his wife and explained in great detail each comment made by radiologist on the PET CT scan I have personally reviewed the radiological images as listed and agreed with the findings in the report.  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 The patient would definitely need second opinion/bone marrow transplant evaluation. He has an appointment to see physicians at [facility] tomorrow. We discussed importance of induction chemotherapy prior to bone marrow transplant. I recommend regimen involving Cytoxan, dexamethasone and Velcade that works well for both multiple myeloma and amyloidosis. He tolerated treatment well. He will continue dexamethasone 20 mg weekly, Cytoxan 300 mg weekly and Velcade on days 1, 4, 7 and 11. I'm waiting for dental clearance to proceed with Zometa. In the meantime, he will continue on vitamin D and calcium  supplements  Amyloidosis I will order cardiac biomarkers in the future. I would recommend sending his tissue block to [facility] for mass spectrometry for tissue subtype. 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 As mentioned above, combination therapy with Cytoxan, dexamethasone and Velcade is an approved regimen for amyloidosis. The dosing for amyloidosis is quite different compared to the dosing for multiple myeloma. Due to significant presence of plasma cells in his bone marrow, I recommend treating him aggressively with twice-weekly regimen of Velcade rather than weekly. I recommend cardiology evaluation to exclude cardiac amyloidosis as this would impact decision for bone marrow transplant in the future    Coronary artery calcification seen on CAT scan This patient have significant risk factors for coronary artery disease. I recommend he starts aspirin therapy and he is compliant. As mentioned above, I will refer him to cardiologist for medical management and to exclude cardiac involvement by amyloidosis    Lesion of mandible The patient would be candidate for IV bisphosphonate. I am concerned about the mandible lesion and risk of osteonecrosis of the jaw. I recommend he returns to his dentist for dental clearance prior to starting him on treatment with Zometa.  Diabetes mellitus without complication His blood sugar was high for several days after treatment. I recommend he increased metformin to twice a day 3 days after treatment and to increase Lantus to 6 units for 3 days after treatment.  Bleeding hemorrhoid This could be exacerbated by recent aspirin therapy. I recommend increase hygiene, and to add stool softener. The results of the PET CT scan show increased activity but I suspect it could be just due to hemorrhoids. I will repeat her PET/CT scan for evaluation in the future in about 3-4 months. If the abnormal  lesion is persistent, we will refer him to see Gen. surgery.   No orders of the defined types were placed in this encounter.   All questions were answered. The patient knows to call the clinic with any problems, questions or concerns. No barriers to learning was detected. I spent 40 minutes counseling the patient face to face. The total time spent in the appointment was 55 minutes and more than 50% was on counseling and review of test results     [facility], Sierria Bruney, MD 03/05/2014 11:22 AM

## 2014-03-06 ENCOUNTER — Telehealth: Payer: Self-pay | Admitting: *Deleted

## 2014-03-06 ENCOUNTER — Other Ambulatory Visit: Payer: Self-pay | Admitting: Hematology and Oncology

## 2014-03-06 DIAGNOSIS — G4700 Insomnia, unspecified: Secondary | ICD-10-CM

## 2014-03-06 DIAGNOSIS — C9000 Multiple myeloma not having achieved remission: Secondary | ICD-10-CM

## 2014-03-06 LAB — TISSUE HYBRIDIZATION (BONE MARROW)-NCBH

## 2014-03-06 MED ORDER — ZOLPIDEM TARTRATE 10 MG PO TABS: 10.0000 mg | ORAL_TABLET | Freq: Every evening | ORAL | Status: DC | PRN

## 2014-03-06 MED ORDER — OXYCODONE HCL 5 MG PO TABS: 5.0000 mg | ORAL_TABLET | ORAL | Status: DC | PRN

## 2014-03-06 NOTE — Telephone Encounter (Signed)
Pt states needs refill on pain medication.  He is taking at night because it does help his pain but also helps him sleep.  He asks if Dr. Alvy Bimler recommends something else to help him sleep at night because he feels he is probably taking it more for sleep than for pain.   He does need refill though as he only has two pills left.  He also wants to know if ok to take Benadryl or Nyquil at bedtime?   Also asked him about his dentist.  We have not received any letter from dentist yet for clearance.  Gave pt our fax number to have his dentist send over form for clearance.

## 2014-03-07 NOTE — Telephone Encounter (Signed)
I wrote 1) ambien prn for insomnia 2) switch pain meds to oxycodone prn (without tylenol to monitor for fever) 3)  Clearance for dentist, please follow

## 2014-03-08 ENCOUNTER — Telehealth: Payer: Self-pay | Admitting: *Deleted

## 2014-03-08 ENCOUNTER — Inpatient Hospital Stay (HOSPITAL_COMMUNITY)
Admission: EM | Admit: 2014-03-08 | Discharge: 2014-03-11 | DRG: 871 | Disposition: A | Discharge status: Home / Self Care | Payer: BC Managed Care – PPO | Attending: Internal Medicine | Admitting: Internal Medicine

## 2014-03-08 ENCOUNTER — Encounter (HOSPITAL_COMMUNITY): Payer: Self-pay | Admitting: Emergency Medicine

## 2014-03-08 ENCOUNTER — Emergency Department (HOSPITAL_COMMUNITY): Payer: BC Managed Care – PPO

## 2014-03-08 ENCOUNTER — Other Ambulatory Visit: Payer: Self-pay | Admitting: Hematology and Oncology

## 2014-03-08 ENCOUNTER — Ambulatory Visit (HOSPITAL_BASED_OUTPATIENT_CLINIC_OR_DEPARTMENT_OTHER): Payer: BC Managed Care – PPO

## 2014-03-08 DIAGNOSIS — J189 Pneumonia, unspecified organism: Secondary | ICD-10-CM | POA: Diagnosis present

## 2014-03-08 DIAGNOSIS — D630 Anemia in neoplastic disease: Secondary | ICD-10-CM | POA: Diagnosis present

## 2014-03-08 DIAGNOSIS — Z79891 Long term (current) use of opiate analgesic: Secondary | ICD-10-CM

## 2014-03-08 DIAGNOSIS — Z801 Family history of malignant neoplasm of trachea, bronchus and lung: Secondary | ICD-10-CM

## 2014-03-08 DIAGNOSIS — E119 Type 2 diabetes mellitus without complications: Secondary | ICD-10-CM | POA: Diagnosis present

## 2014-03-08 DIAGNOSIS — A419 Sepsis, unspecified organism: Principal | ICD-10-CM | POA: Diagnosis present

## 2014-03-08 DIAGNOSIS — K219 Gastro-esophageal reflux disease without esophagitis: Secondary | ICD-10-CM | POA: Diagnosis present

## 2014-03-08 DIAGNOSIS — T451X5A Adverse effect of antineoplastic and immunosuppressive drugs, initial encounter: Secondary | ICD-10-CM | POA: Diagnosis present

## 2014-03-08 DIAGNOSIS — Z5112 Encounter for antineoplastic immunotherapy: Secondary | ICD-10-CM

## 2014-03-08 DIAGNOSIS — C9001 Multiple myeloma in remission: Secondary | ICD-10-CM | POA: Diagnosis present

## 2014-03-08 DIAGNOSIS — R509 Fever, unspecified: Secondary | ICD-10-CM | POA: Diagnosis not present

## 2014-03-08 DIAGNOSIS — E859 Amyloidosis, unspecified: Secondary | ICD-10-CM | POA: Diagnosis present

## 2014-03-08 DIAGNOSIS — C9000 Multiple myeloma not having achieved remission: Secondary | ICD-10-CM | POA: Diagnosis present

## 2014-03-08 DIAGNOSIS — E785 Hyperlipidemia, unspecified: Secondary | ICD-10-CM

## 2014-03-08 DIAGNOSIS — E853 Secondary systemic amyloidosis: Secondary | ICD-10-CM | POA: Diagnosis present

## 2014-03-08 DIAGNOSIS — D6959 Other secondary thrombocytopenia: Secondary | ICD-10-CM | POA: Diagnosis present

## 2014-03-08 DIAGNOSIS — J90 Pleural effusion, not elsewhere classified: Secondary | ICD-10-CM | POA: Diagnosis present

## 2014-03-08 DIAGNOSIS — Z794 Long term (current) use of insulin: Secondary | ICD-10-CM

## 2014-03-08 DIAGNOSIS — Z8547 Personal history of malignant neoplasm of testis: Secondary | ICD-10-CM

## 2014-03-08 DIAGNOSIS — Z87891 Personal history of nicotine dependence: Secondary | ICD-10-CM

## 2014-03-08 DIAGNOSIS — Z79899 Other long term (current) drug therapy: Secondary | ICD-10-CM

## 2014-03-08 DIAGNOSIS — Z791 Long term (current) use of non-steroidal anti-inflammatories (NSAID): Secondary | ICD-10-CM

## 2014-03-08 DIAGNOSIS — Z833 Family history of diabetes mellitus: Secondary | ICD-10-CM

## 2014-03-08 DIAGNOSIS — Z807 Family history of other malignant neoplasms of lymphoid, hematopoietic and related tissues: Secondary | ICD-10-CM

## 2014-03-08 LAB — COMPREHENSIVE METABOLIC PANEL
ALT: 71 U/L — AB (ref 0–53)
AST: 53 U/L — ABNORMAL HIGH (ref 0–37)
Albumin: 3.5 g/dL (ref 3.5–5.2)
Alkaline Phosphatase: 92 U/L (ref 39–117)
Anion gap: 11 (ref 5–15)
BUN: 17 mg/dL (ref 6–23)
CALCIUM: 9 mg/dL (ref 8.4–10.5)
CO2: 26 meq/L (ref 19–32)
Chloride: 102 mEq/L (ref 96–112)
Creatinine, Ser: 1.29 mg/dL (ref 0.50–1.35)
GFR, EST AFRICAN AMERICAN: 68 mL/min — AB (ref >90)
GFR, EST NON AFRICAN AMERICAN: 58 mL/min — AB (ref >90)
GLUCOSE: 96 mg/dL (ref 70–99)
Potassium: 4.2 mEq/L (ref 3.7–5.3)
Sodium: 139 mEq/L (ref 137–147)
Total Bilirubin: 0.3 mg/dL (ref 0.3–1.2)
Total Protein: 7.4 g/dL (ref 6.0–8.3)

## 2014-03-08 LAB — CBC WITH DIFFERENTIAL/PLATELET
Basophils Absolute: 0 10*3/uL (ref 0.0–0.1)
Basophils Relative: 0 % (ref 0–1)
Eosinophils Absolute: 0.1 10*3/uL (ref 0.0–0.7)
Eosinophils Relative: 2 % (ref 0–5)
HCT: 36.9 % — ABNORMAL LOW (ref 39.0–52.0)
Hemoglobin: 12.1 g/dL — ABNORMAL LOW (ref 13.0–17.0)
LYMPHS ABS: 1 10*3/uL (ref 0.7–4.0)
LYMPHS PCT: 14 % (ref 12–46)
MCH: 30.6 pg (ref 26.0–34.0)
MCHC: 32.8 g/dL (ref 30.0–36.0)
MCV: 93.4 fL (ref 78.0–100.0)
Monocytes Absolute: 1 10*3/uL (ref 0.1–1.0)
Monocytes Relative: 15 % — ABNORMAL HIGH (ref 3–12)
NEUTROS ABS: 4.8 10*3/uL (ref 1.7–7.7)
Neutrophils Relative %: 69 % (ref 43–77)
PLATELETS: 143 10*3/uL — AB (ref 150–400)
RBC: 3.95 MIL/uL — ABNORMAL LOW (ref 4.22–5.81)
RDW: 15 % (ref 11.5–15.5)
WBC: 6.8 10*3/uL (ref 4.0–10.5)

## 2014-03-08 LAB — TROPONIN T: Troponin T TROPT: <0.01 ng/mL (ref <0.01)

## 2014-03-08 LAB — I-STAT CG4 LACTIC ACID, ED: Lactic Acid, Venous: 1.92 mmol/L (ref 0.5–2.2)

## 2014-03-08 MED ORDER — BORTEZOMIB CHEMO SQ INJECTION 3.5 MG (2.5MG/ML)
1.3000 mg/m2 | Freq: Once | INTRAMUSCULAR | Status: AC
  Administered 2014-03-08: 2.75 mg via SUBCUTANEOUS
  Filled 2014-03-08: qty 2.75

## 2014-03-08 MED ORDER — ONDANSETRON HCL 8 MG PO TABS
ORAL_TABLET | ORAL | Status: AC
  Filled 2014-03-08: qty 1

## 2014-03-08 MED ORDER — DEXTROSE 5 % IV SOLN
2.0000 g | Freq: Once | INTRAVENOUS | Status: AC
  Administered 2014-03-08: 2 g via INTRAVENOUS
  Filled 2014-03-08: qty 2

## 2014-03-08 MED ORDER — SODIUM CHLORIDE 0.9 % IV BOLUS (SEPSIS)
1000.0000 mL | INTRAVENOUS | Status: AC
  Administered 2014-03-08 – 2014-03-09 (×3): 1000 mL via INTRAVENOUS

## 2014-03-08 MED ORDER — ACETAMINOPHEN 325 MG PO TABS
650.0000 mg | ORAL_TABLET | Freq: Once | ORAL | Status: AC
  Administered 2014-03-08: 650 mg via ORAL
  Filled 2014-03-08: qty 2

## 2014-03-08 MED ORDER — ONDANSETRON HCL 8 MG PO TABS
8.0000 mg | ORAL_TABLET | Freq: Once | ORAL | Status: AC
  Administered 2014-03-08: 8 mg via ORAL

## 2014-03-08 NOTE — ED Notes (Signed)
Pt. Made aware for the need of urine. 

## 2014-03-08 NOTE — ED Notes (Signed)
Pt presented at triage with a fever; pt states that he had not felt good all day and checked his temperature and it was 101.1 degrees  F at home.  Pt is a cancer pt and has a chemo card

## 2014-03-08 NOTE — ED Provider Notes (Signed)
CSN: [id number]     Arrival date & time 03/08/14  2107 History   First MD Initiated Contact with Patient 03/08/14 2118     No chief complaint on file.    The history is provided by the patient.   Mr. Tyler Willis presents for evaluation of fever. As a history of multiple myeloma and abdominal line. He's been on chemotherapy for the last 2 weeks. Today he developed fever and chills about an hour and a half prior to ED arrival. He reports mild sore throat but no other symptoms. He has ongoing body aches from his multiple myeloma but there is no change in his body aches. Symptoms are moderate, constant, worsening. No chest pain, no cough, no shortness of breath, no vomiting, no diarrhea, no dysuria. Past Medical History  Diagnosis Date  . Diabetes mellitus without complication   . GERD (gastroesophageal reflux disease)   . 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 Multiple myeloma 02/19/2014   No past surgical history on file. Family History  Problem Relation Age of Onset  . Diabetes Father   . Cancer Maternal Uncle     lung cancer  . Cancer Cousin     myeloma   History  Substance Use Topics  . Smoking status: Former Smoker    Types: Pipe  . Smokeless tobacco: Never Used  . Alcohol Use: No    Review of Systems  All other systems reviewed and are negative.     Allergies  Review of patient's allergies indicates no known allergies.  Home Medications   Prior to Admission medications   Medication Sig Start Date End Date Taking? Authorizing Provider  acyclovir (ZOVIRAX) 400 MG tablet Take 1 tablet (400 mg total) by mouth daily. 02/22/14  Yes Heath Lark, MD  cyclophosphamide (CYTOXAN) 50 MG tablet Take 6 tablets on days 1,8,15 of chemo 1hr before or 2hr after meals every 21d. 02/22/14  Yes Ni Gorsuch, MD  insulin glargine (LANTUS) 100 UNIT/ML injection Inject 5 Units into the skin at bedtime.   Yes Historical Provider, MD  Magnesium 400 MG TABS Take 400 mg by mouth daily.   Yes Historical  Provider, MD  metFORMIN (GLUCOPHAGE) 500 MG tablet Take 500 mg by mouth 2 (two) times daily with a meal.   Yes Historical Provider, MD  Multiple Vitamin (MULTIVITAMIN) tablet Take 1 tablet by mouth daily.   Yes Historical Provider, MD  omeprazole (PRILOSEC) 20 MG capsule Take 20 mg by mouth daily.   Yes Historical Provider, MD  polyethylene glycol (MIRALAX / GLYCOLAX) packet Take 17 g by mouth daily.   Yes Historical Provider, MD  pravastatin (PRAVACHOL) 40 MG tablet Take 40 mg by mouth daily. Daily 11/20/13  Yes Historical Provider, MD  BAYER CONTOUR NEXT TEST test strip  11/29/13   Historical Provider, MD  dexamethasone (DECADRON) 4 MG tablet Take 10 pills every Monday with breakfast 02/22/14   Heath Lark, MD  guaiFENesin-codeine 100-10 MG/5ML syrup Take 5 mLs by mouth every 6 (six) hours as needed for cough. 02/06/14   Truitt Merle, MD  naproxen sodium (ANAPROX) 220 MG tablet Take 220 mg by mouth as needed (pain).     Historical Provider, MD  ondansetron (ZOFRAN) 8 MG tablet Take 1 tablet (8 mg total) by mouth every 8 (eight) hours as needed. 02/22/14   Heath Lark, MD  oxyCODONE (OXY IR/ROXICODONE) 5 MG immediate release tablet Take 1 tablet (5 mg total) by mouth every 4 (four) hours as needed for severe pain.  03/06/14   Heath Lark, MD  oxyCODONE-acetaminophen (PERCOCET/ROXICET) 5-325 MG per tablet Take 1 tablet by mouth every 4 (four) hours as needed for severe pain. 02/06/14   Truitt Merle, MD  prochlorperazine (COMPAZINE) 10 MG tablet Take 1 tablet (10 mg total) by mouth every 6 (six) hours as needed (Nausea or vomiting). 02/22/14   Heath Lark, MD  psyllium (REGULOID) 0.52 G capsule Take 5 capsules by mouth daily.    Historical Provider, MD  zolpidem (AMBIEN) 10 MG tablet Take 1 tablet (10 mg total) by mouth at bedtime as needed for sleep. 03/06/14 04/05/14  Ni Gorsuch, MD   BP 139/76 mmHg  Pulse 103  Temp(Src) 101.2 F (38.4 C) (Oral)  Resp 18  SpO2 97% Physical Exam  Constitutional: He is oriented  to person, place, and time. He appears well-developed and well-nourished.  HENT:  Head: Normocephalic and atraumatic.  Cardiovascular:  No murmur heard. tachycardic  Pulmonary/Chest: Effort normal. No respiratory distress.  Crackles in the left lung base  Abdominal: Soft. There is no tenderness. There is no rebound and no guarding.  Musculoskeletal: He exhibits no edema or tenderness.  Neurological: He is alert and oriented to person, place, and time.  Skin: Skin is warm and dry.  Psychiatric: He has a normal mood and affect. His behavior is normal.  Nursing note and vitals reviewed.   ED Course  Procedures (including critical care time) Labs Review Labs Reviewed  COMPREHENSIVE METABOLIC PANEL - Abnormal; Notable for the following:    AST 53 (*)    ALT 71 (*)    GFR calc non Af Amer 58 (*)    GFR calc Af Amer 68 (*)    All other components within normal limits  CBC WITH DIFFERENTIAL - Abnormal; Notable for the following:    RBC 3.95 (*)    Hemoglobin 12.1 (*)    HCT 36.9 (*)    Platelets 143 (*)    Monocytes Relative 15 (*)    All other components within normal limits  CULTURE, BLOOD (ROUTINE X 2)  CULTURE, BLOOD (ROUTINE X 2)  URINE CULTURE  CBC WITH DIFFERENTIAL  URINALYSIS, ROUTINE W REFLEX MICROSCOPIC  I-STAT CG4 LACTIC ACID, ED    Imaging Review Dg Chest Port 1 View  03/08/2014   CLINICAL DATA:  Multiple myeloma.  Fever.  EXAM: PORTABLE CHEST - 1 VIEW  COMPARISON:  Most recent chest x-ray comparison 11/29/2007  FINDINGS: Normal heart size. Stable mild aortic tortuosity. There is minimal atelectasis or scarring at the left base. There is no edema, consolidation, effusion, or pneumothorax. Myelomatous changes, best visualized in the bilateral clavicles.  IMPRESSION: Negative for pneumonia.   Electronically Signed   By: Jorje Guild M.D.   On: 03/08/2014 22:20     EKG Interpretation None      MDM   Final diagnoses:  Febrile illness, acute    Pt with  multiple myeloma here for evaluation of fever.  Concern for potential pneumonia based on lung exam, pt without infiltrate on CXR.  Plan to admit to medicine for observation.      Quintella Reichert, MD 03/09/14 [phone]

## 2014-03-08 NOTE — ED Notes (Signed)
Pt. On cardiac monitor. 

## 2014-03-08 NOTE — Patient Instructions (Signed)
[facility] Discharge Instructions for Patients Receiving Chemotherapy  Today you received the following chemotherapy agents: Velcade  To help prevent nausea and vomiting after your treatment, we encourage you to take your nausea medication as prescribed by your physician.   If you develop nausea and vomiting that is not controlled by your nausea medication, call the clinic.   BELOW ARE SYMPTOMS THAT SHOULD BE REPORTED IMMEDIATELY:  *FEVER GREATER THAN 100.5 F  *CHILLS WITH OR WITHOUT FEVER  NAUSEA AND VOMITING THAT IS NOT CONTROLLED WITH YOUR NAUSEA MEDICATION  *UNUSUAL SHORTNESS OF BREATH  *UNUSUAL BRUISING OR BLEEDING  TENDERNESS IN MOUTH AND THROAT WITH OR WITHOUT PRESENCE OF ULCERS  *URINARY PROBLEMS  *BOWEL PROBLEMS  UNUSUAL RASH Items with * indicate a potential emergency and should be followed up as soon as possible.  Feel free to call the clinic you have any questions or concerns. The clinic phone number is [phone].

## 2014-03-08 NOTE — Telephone Encounter (Signed)
Left VM for pt asking if he received Rxs while he was here yesterday.  If not they are ready to pick up.  Asked him to call back if any questions.

## 2014-03-09 ENCOUNTER — Other Ambulatory Visit: Payer: Self-pay | Admitting: Hematology and Oncology

## 2014-03-09 ENCOUNTER — Encounter (HOSPITAL_COMMUNITY): Payer: Self-pay

## 2014-03-09 ENCOUNTER — Encounter: Payer: Self-pay | Admitting: *Deleted

## 2014-03-09 DIAGNOSIS — J189 Pneumonia, unspecified organism: Secondary | ICD-10-CM | POA: Diagnosis present

## 2014-03-09 DIAGNOSIS — Z807 Family history of other malignant neoplasms of lymphoid, hematopoietic and related tissues: Secondary | ICD-10-CM | POA: Diagnosis not present

## 2014-03-09 DIAGNOSIS — Z794 Long term (current) use of insulin: Secondary | ICD-10-CM | POA: Diagnosis not present

## 2014-03-09 DIAGNOSIS — E785 Hyperlipidemia, unspecified: Secondary | ICD-10-CM

## 2014-03-09 DIAGNOSIS — C9000 Multiple myeloma not having achieved remission: Secondary | ICD-10-CM | POA: Diagnosis present

## 2014-03-09 DIAGNOSIS — Z833 Family history of diabetes mellitus: Secondary | ICD-10-CM | POA: Diagnosis not present

## 2014-03-09 DIAGNOSIS — Z79899 Other long term (current) drug therapy: Secondary | ICD-10-CM | POA: Diagnosis not present

## 2014-03-09 DIAGNOSIS — E119 Type 2 diabetes mellitus without complications: Secondary | ICD-10-CM | POA: Diagnosis present

## 2014-03-09 DIAGNOSIS — R509 Fever, unspecified: Secondary | ICD-10-CM

## 2014-03-09 DIAGNOSIS — D6959 Other secondary thrombocytopenia: Secondary | ICD-10-CM | POA: Diagnosis present

## 2014-03-09 DIAGNOSIS — D630 Anemia in neoplastic disease: Secondary | ICD-10-CM

## 2014-03-09 DIAGNOSIS — K219 Gastro-esophageal reflux disease without esophagitis: Secondary | ICD-10-CM

## 2014-03-09 DIAGNOSIS — Z801 Family history of malignant neoplasm of trachea, bronchus and lung: Secondary | ICD-10-CM | POA: Diagnosis not present

## 2014-03-09 DIAGNOSIS — J90 Pleural effusion, not elsewhere classified: Secondary | ICD-10-CM | POA: Diagnosis present

## 2014-03-09 DIAGNOSIS — R112 Nausea with vomiting, unspecified: Secondary | ICD-10-CM

## 2014-03-09 DIAGNOSIS — T451X5A Adverse effect of antineoplastic and immunosuppressive drugs, initial encounter: Secondary | ICD-10-CM | POA: Diagnosis present

## 2014-03-09 DIAGNOSIS — E859 Amyloidosis, unspecified: Secondary | ICD-10-CM | POA: Diagnosis present

## 2014-03-09 DIAGNOSIS — Z87891 Personal history of nicotine dependence: Secondary | ICD-10-CM | POA: Diagnosis not present

## 2014-03-09 DIAGNOSIS — Z791 Long term (current) use of non-steroidal anti-inflammatories (NSAID): Secondary | ICD-10-CM | POA: Diagnosis not present

## 2014-03-09 DIAGNOSIS — Z79891 Long term (current) use of opiate analgesic: Secondary | ICD-10-CM | POA: Diagnosis not present

## 2014-03-09 DIAGNOSIS — Z8547 Personal history of malignant neoplasm of testis: Secondary | ICD-10-CM | POA: Diagnosis not present

## 2014-03-09 DIAGNOSIS — D696 Thrombocytopenia, unspecified: Secondary | ICD-10-CM

## 2014-03-09 DIAGNOSIS — A419 Sepsis, unspecified organism: Secondary | ICD-10-CM | POA: Diagnosis present

## 2014-03-09 LAB — CBC
HEMATOCRIT: 31.4 % — AB (ref 39.0–52.0)
HEMOGLOBIN: 10.4 g/dL — AB (ref 13.0–17.0)
MCH: 30.6 pg (ref 26.0–34.0)
MCHC: 33.1 g/dL (ref 30.0–36.0)
MCV: 92.4 fL (ref 78.0–100.0)
Platelets: 126 10*3/uL — ABNORMAL LOW (ref 150–400)
RBC: 3.4 MIL/uL — AB (ref 4.22–5.81)
RDW: 15 % (ref 11.5–15.5)
WBC: 5.1 10*3/uL (ref 4.0–10.5)

## 2014-03-09 LAB — URINALYSIS, ROUTINE W REFLEX MICROSCOPIC
Bilirubin Urine: NEGATIVE
GLUCOSE, UA: NEGATIVE mg/dL
Hgb urine dipstick: NEGATIVE
KETONES UR: NEGATIVE mg/dL
Leukocytes, UA: NEGATIVE
Nitrite: NEGATIVE
Protein, ur: NEGATIVE mg/dL
Specific Gravity, Urine: 1.016 (ref 1.005–1.030)
Urobilinogen, UA: 0.2 mg/dL (ref 0.0–1.0)
pH: 5 (ref 5.0–8.0)

## 2014-03-09 LAB — COMPREHENSIVE METABOLIC PANEL
ALBUMIN: 2.8 g/dL — AB (ref 3.5–5.2)
ALT: 69 U/L — ABNORMAL HIGH (ref 0–53)
ANION GAP: 9 (ref 5–15)
AST: 50 U/L — ABNORMAL HIGH (ref 0–37)
Alkaline Phosphatase: 73 U/L (ref 39–117)
BUN: 15 mg/dL (ref 6–23)
CALCIUM: 7.9 mg/dL — AB (ref 8.4–10.5)
CO2: 23 mEq/L (ref 19–32)
CREATININE: 1.29 mg/dL (ref 0.50–1.35)
Chloride: 109 mEq/L (ref 96–112)
GFR calc non Af Amer: 58 mL/min — ABNORMAL LOW (ref >90)
GFR, EST AFRICAN AMERICAN: 68 mL/min — AB (ref >90)
GLUCOSE: 90 mg/dL (ref 70–99)
POTASSIUM: 3.8 meq/L (ref 3.7–5.3)
Sodium: 141 mEq/L (ref 137–147)
TOTAL PROTEIN: 6 g/dL (ref 6.0–8.3)
Total Bilirubin: 0.2 mg/dL — ABNORMAL LOW (ref 0.3–1.2)

## 2014-03-09 LAB — HEMOGLOBIN A1C
Hgb A1c MFr Bld: 6.7 % — ABNORMAL HIGH (ref <5.7)
MEAN PLASMA GLUCOSE: 146 mg/dL — AB (ref <117)

## 2014-03-09 LAB — GLUCOSE, CAPILLARY
GLUCOSE-CAPILLARY: 83 mg/dL (ref 70–99)
GLUCOSE-CAPILLARY: 101 mg/dL — AB (ref 70–99)
Glucose-Capillary: 112 mg/dL — ABNORMAL HIGH (ref 70–99)

## 2014-03-09 LAB — LIPID PANEL
CHOLESTEROL: 123 mg/dL (ref 0–200)
HDL: 19 mg/dL — ABNORMAL LOW (ref >39)
LDL Cholesterol: 63 mg/dL (ref 0–99)
Total CHOL/HDL Ratio: 6.5 RATIO
Triglycerides: 205 mg/dL — ABNORMAL HIGH (ref <150)
VLDL: 41 mg/dL — AB (ref 0–40)

## 2014-03-09 LAB — PROTIME-INR
INR: 1.12 (ref 0.00–1.49)
PROTHROMBIN TIME: 14.5 s (ref 11.6–15.2)

## 2014-03-09 LAB — I-STAT CG4 LACTIC ACID, ED: Lactic Acid, Venous: 1.32 mmol/L (ref 0.5–2.2)

## 2014-03-09 MED ORDER — IBUPROFEN 200 MG PO TABS
600.0000 mg | ORAL_TABLET | Freq: Once | ORAL | Status: AC
  Administered 2014-03-09: 600 mg via ORAL
  Filled 2014-03-09: qty 3

## 2014-03-09 MED ORDER — INSULIN ASPART 100 UNIT/ML ~~LOC~~ SOLN
0.0000 [IU] | Freq: Three times a day (TID) | SUBCUTANEOUS | Status: DC
  Administered 2014-03-10 – 2014-03-11 (×3): 1 [IU] via SUBCUTANEOUS

## 2014-03-09 MED ORDER — PROCHLORPERAZINE MALEATE 10 MG PO TABS
10.0000 mg | ORAL_TABLET | Freq: Four times a day (QID) | ORAL | Status: DC | PRN
  Filled 2014-03-09: qty 1

## 2014-03-09 MED ORDER — PRAVASTATIN SODIUM 40 MG PO TABS
40.0000 mg | ORAL_TABLET | Freq: Every day | ORAL | Status: DC
  Administered 2014-03-10: 40 mg via ORAL
  Filled 2014-03-09 (×3): qty 1

## 2014-03-09 MED ORDER — PANTOPRAZOLE SODIUM 40 MG PO TBEC
40.0000 mg | DELAYED_RELEASE_TABLET | Freq: Every day | ORAL | Status: DC
  Administered 2014-03-09 – 2014-03-11 (×3): 40 mg via ORAL
  Filled 2014-03-09 (×4): qty 1

## 2014-03-09 MED ORDER — PROMETHAZINE HCL 25 MG/ML IJ SOLN
25.0000 mg | Freq: Three times a day (TID) | INTRAMUSCULAR | Status: DC | PRN
  Administered 2014-03-09: 25 mg via INTRAVENOUS
  Filled 2014-03-09: qty 1

## 2014-03-09 MED ORDER — POLYETHYLENE GLYCOL 3350 17 G PO PACK
17.0000 g | PACK | Freq: Every day | ORAL | Status: DC
  Administered 2014-03-09 – 2014-03-11 (×3): 17 g via ORAL
  Filled 2014-03-09 (×3): qty 1

## 2014-03-09 MED ORDER — SODIUM CHLORIDE 0.9 % IJ SOLN: 3.0000 mL | Freq: Two times a day (BID) | INTRAMUSCULAR | Status: DC

## 2014-03-09 MED ORDER — MAGNESIUM OXIDE 400 (241.3 MG) MG PO TABS
400.0000 mg | ORAL_TABLET | Freq: Every day | ORAL | Status: DC
  Administered 2014-03-09 – 2014-03-11 (×3): 400 mg via ORAL
  Filled 2014-03-09 (×3): qty 1

## 2014-03-09 MED ORDER — ONDANSETRON HCL 4 MG PO TABS
8.0000 mg | ORAL_TABLET | Freq: Three times a day (TID) | ORAL | Status: DC | PRN
  Administered 2014-03-09: 8 mg via ORAL
  Filled 2014-03-09: qty 2

## 2014-03-09 MED ORDER — SODIUM CHLORIDE 0.9 % IV SOLN
INTRAVENOUS | Status: DC
  Administered 2014-03-09 – 2014-03-11 (×6): via INTRAVENOUS

## 2014-03-09 MED ORDER — GUAIFENESIN-CODEINE 100-10 MG/5ML PO SOLN: 5.0000 mL | Freq: Four times a day (QID) | ORAL | Status: DC | PRN

## 2014-03-09 MED ORDER — SODIUM CHLORIDE 0.9 % IV SOLN
400.0000 mg | Freq: Once | INTRAVENOUS | Status: AC
  Administered 2014-03-09: 400 mg via INTRAVENOUS
  Filled 2014-03-09: qty 4

## 2014-03-09 MED ORDER — OXYCODONE HCL 5 MG PO TABS: 5.0000 mg | ORAL_TABLET | ORAL | Status: DC | PRN

## 2014-03-09 MED ORDER — ONDANSETRON HCL 4 MG/2ML IJ SOLN
4.0000 mg | Freq: Four times a day (QID) | INTRAMUSCULAR | Status: DC | PRN
  Administered 2014-03-09: 4 mg via INTRAVENOUS
  Filled 2014-03-09: qty 2

## 2014-03-09 MED ORDER — INSULIN GLARGINE 100 UNIT/ML ~~LOC~~ SOLN
5.0000 [IU] | Freq: Every day | SUBCUTANEOUS | Status: DC
  Administered 2014-03-09 – 2014-03-10 (×2): 5 [IU] via SUBCUTANEOUS
  Filled 2014-03-09 (×2): qty 0.05

## 2014-03-09 MED ORDER — ACETAMINOPHEN 650 MG RE SUPP
650.0000 mg | RECTAL | Status: DC | PRN
  Administered 2014-03-09: 650 mg via RECTAL
  Filled 2014-03-09: qty 1

## 2014-03-09 MED ORDER — PNEUMOCOCCAL VAC POLYVALENT 25 MCG/0.5ML IJ INJ
0.5000 mL | INJECTION | INTRAMUSCULAR | Status: DC
Start: 2014-03-10 — End: 2014-03-11
  Filled 2014-03-09 (×2): qty 0.5

## 2014-03-09 MED ORDER — VANCOMYCIN HCL 10 G IV SOLR
1250.0000 mg | Freq: Two times a day (BID) | INTRAVENOUS | Status: DC
  Administered 2014-03-09 – 2014-03-10 (×4): 1250 mg via INTRAVENOUS
  Filled 2014-03-09 (×5): qty 1250

## 2014-03-09 MED ORDER — ADULT MULTIVITAMIN W/MINERALS CH
1.0000 | ORAL_TABLET | Freq: Every day | ORAL | Status: DC
  Administered 2014-03-09 – 2014-03-11 (×3): 1 via ORAL
  Filled 2014-03-09 (×3): qty 1

## 2014-03-09 MED ORDER — VANCOMYCIN HCL 10 G IV SOLR
1500.0000 mg | Freq: Once | INTRAVENOUS | Status: AC
  Administered 2014-03-09: 1500 mg via INTRAVENOUS
  Filled 2014-03-09: qty 1500

## 2014-03-09 MED ORDER — ACYCLOVIR 400 MG PO TABS
400.0000 mg | ORAL_TABLET | Freq: Every day | ORAL | Status: DC
  Administered 2014-03-09 – 2014-03-11 (×3): 400 mg via ORAL
  Filled 2014-03-09 (×3): qty 1

## 2014-03-09 MED ORDER — PSYLLIUM 95 % PO PACK
1.0000 | PACK | Freq: Every day | ORAL | Status: DC
  Administered 2014-03-09 – 2014-03-11 (×3): 1 via ORAL
  Filled 2014-03-09 (×3): qty 1

## 2014-03-09 MED ORDER — ZOLPIDEM TARTRATE 10 MG PO TABS: 10.0000 mg | ORAL_TABLET | Freq: Every evening | ORAL | Status: DC | PRN

## 2014-03-09 MED ORDER — HEPARIN SODIUM (PORCINE) 5000 UNIT/ML IJ SOLN
5000.0000 [IU] | Freq: Three times a day (TID) | INTRAMUSCULAR | Status: DC
  Administered 2014-03-09 – 2014-03-11 (×7): 5000 [IU] via SUBCUTANEOUS
  Filled 2014-03-09 (×8): qty 1

## 2014-03-09 MED ORDER — IBUPROFEN 200 MG PO TABS: 400.0000 mg | ORAL_TABLET | Freq: Four times a day (QID) | ORAL | Status: DC | PRN

## 2014-03-09 MED ORDER — OXYCODONE-ACETAMINOPHEN 5-325 MG PO TABS
1.0000 | ORAL_TABLET | ORAL | Status: DC | PRN
  Administered 2014-03-10 (×2): 1 via ORAL
  Filled 2014-03-09 (×2): qty 1

## 2014-03-09 MED ORDER — DEXTROSE 5 % IV SOLN
2.0000 g | Freq: Three times a day (TID) | INTRAVENOUS | Status: DC
  Administered 2014-03-09 – 2014-03-11 (×7): 2 g via INTRAVENOUS
  Filled 2014-03-09 (×8): qty 2

## 2014-03-09 NOTE — H&P (Signed)
[facility] History and Physical  Tyler Willis TLX:[id number] DOB: [birth year] DOA: 03/08/2014  Referring physician: ED physician PCP: Gennette Pac, MD  Specialists:   Chief Complaint: Fever  HPI: Tyler Willis is a 63 y.o. male with a past medical history of multiple myeloma on chemotherapy, diabetes type 2, amyloidosis, testis seminoma, GERD, hyperlipidemia, who presents with fever.  Patient reports that he started having fever and chills at about 8:00 PM. His body temperature was 101.5. He has very mild sore throat, no other symptoms. He denies fatigue, headaches, cough, chest pain, SOB, abdominal pain, diarrhea, constipation, dysuria, urgency, frequency, hematuria, skin rashes, joint pain or leg swelling. Patient called cancer center and advised to come to the emergency room for further evaluation and treatment. Of note, patient recently diagnosed as a multiple myeloma on 01/30/14. He is receiving chemotherapy for the last 2 weeks  Work up in the ED demonstrates negative chest x-ray, negative urinalysis for UTI, no leukocytosis, lactate 1.92. Patient is admitted to the inpatient for further evaluation and treatment.  Review of Systems: As presented in the history of presenting illness, rest negative.  Where does patient live? At home   Can patient participate in ADLs? Yes  Allergy: No Known Allergies  Past Medical History  Diagnosis Date  . Diabetes mellitus without complication   . GERD (gastroesophageal reflux disease)   . 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 Multiple myeloma 02/19/2014    History reviewed. No pertinent past surgical history.  Social History:  reports that he has quit smoking. His smoking use included Pipe. He has never used smokeless tobacco. He reports that he does not drink alcohol or use illicit drugs.  Family History:  Family History  Problem Relation Age of Onset  . Diabetes Father   . Cancer Maternal Uncle     lung cancer  . Cancer  Cousin     myeloma     Prior to Admission medications   Medication Sig Start Date End Date Taking? Authorizing Provider  acyclovir (ZOVIRAX) 400 MG tablet Take 1 tablet (400 mg total) by mouth daily. 02/22/14  Yes Heath Lark, MD  cyclophosphamide (CYTOXAN) 50 MG tablet Take 6 tablets on days 1,8,15 of chemo 1hr before or 2hr after meals every 21d. 02/22/14  Yes Ni Gorsuch, MD  insulin glargine (LANTUS) 100 UNIT/ML injection Inject 5 Units into the skin at bedtime.   Yes Historical Provider, MD  Magnesium 400 MG TABS Take 400 mg by mouth daily.   Yes Historical Provider, MD  metFORMIN (GLUCOPHAGE) 500 MG tablet Take 500 mg by mouth 2 (two) times daily with a meal.   Yes Historical Provider, MD  Multiple Vitamin (MULTIVITAMIN) tablet Take 1 tablet by mouth daily.   Yes Historical Provider, MD  omeprazole (PRILOSEC) 20 MG capsule Take 20 mg by mouth daily.   Yes Historical Provider, MD  polyethylene glycol (MIRALAX / GLYCOLAX) packet Take 17 g by mouth daily.   Yes Historical Provider, MD  pravastatin (PRAVACHOL) 40 MG tablet Take 40 mg by mouth daily. Daily 11/20/13  Yes Historical Provider, MD  BAYER CONTOUR NEXT TEST test strip  11/29/13   Historical Provider, MD  dexamethasone (DECADRON) 4 MG tablet Take 10 pills every Monday with breakfast 02/22/14   Heath Lark, MD  guaiFENesin-codeine 100-10 MG/5ML syrup Take 5 mLs by mouth every 6 (six) hours as needed for cough. 02/06/14   Truitt Merle, MD  naproxen sodium (ANAPROX) 220 MG tablet Take 220 mg by mouth  as needed (pain).     Historical Provider, MD  ondansetron (ZOFRAN) 8 MG tablet Take 1 tablet (8 mg total) by mouth every 8 (eight) hours as needed. 02/22/14   Heath Lark, MD  oxyCODONE (OXY IR/ROXICODONE) 5 MG immediate release tablet Take 1 tablet (5 mg total) by mouth every 4 (four) hours as needed for severe pain. 03/06/14   Heath Lark, MD  oxyCODONE-acetaminophen (PERCOCET/ROXICET) 5-325 MG per tablet Take 1 tablet by mouth every 4 (four) hours as  needed for severe pain. 02/06/14   Truitt Merle, MD  prochlorperazine (COMPAZINE) 10 MG tablet Take 1 tablet (10 mg total) by mouth every 6 (six) hours as needed (Nausea or vomiting). 02/22/14   Heath Lark, MD  psyllium (REGULOID) 0.52 G capsule Take 5 capsules by mouth daily.    Historical Provider, MD  zolpidem (AMBIEN) 10 MG tablet Take 1 tablet (10 mg total) by mouth at bedtime as needed for sleep. 03/06/14 04/05/14  Heath Lark, MD    Physical Exam: Filed Vitals:   03/09/14 0001 03/09/14 0030 03/09/14 0100 03/09/14 0150  BP: 109/62 111/63 115/61 108/57  Pulse: 94 92 81 77  Temp: 102 F (38.9 C)   98.2 F (36.8 C)  TempSrc: Oral   Oral  Resp: 17 17 25 20   Height:    6' (1.829 m)  Weight:    92.5 kg (203 lb 14.8 oz)  SpO2: 95% 92% 97% 95%   General: Not in acute distress HEENT:       Eyes: PERRL, EOMI, no scleral icterus       ENT: No discharge from the ears and nose, no pharynx injection, no tonsillar enlargement.        Neck: No JVD, no bruit, no mass felt. Cardiac: S1/S2, RRR, No murmurs, No gallops or rubs Pulm: Has fine crackles over the bases posteriorly. Abd: Soft, nondistended, nontender, no rebound pain, no organomegaly, BS present Ext: No edema bilaterally. 2+DP/PT pulse bilaterally Musculoskeletal: No joint deformities, erythema, or stiffness, ROM full Skin: No rashes.  Neuro: Alert and oriented X3, cranial nerves II-XII grossly intact, muscle strength 5/5 in all extremeties, sensation to light touch intact.  Psych: Patient is not psychotic, no suicidal or hemocidal ideation.  Labs on Admission:  Basic Metabolic Panel:  Recent Labs Lab 03/05/14 0959 03/08/14 2206  NA 135* 139  K 4.0 4.2  CL  --  102  CO2 24 26  GLUCOSE 121 96  BUN 20.7 17  CREATININE 1.3 1.29  CALCIUM 9.5 9.0   Liver Function Tests:  Recent Labs Lab 03/05/14 0959 03/08/14 2206  AST 36* 53*  ALT 54 71*  ALKPHOS 100 92  BILITOT 0.89 0.3  PROT 8.0 7.4  ALBUMIN 4.0 3.5   No results  for input(s): LIPASE, AMYLASE in the last 168 hours. No results for input(s): AMMONIA in the last 168 hours. CBC:  Recent Labs Lab 03/05/14 0959 03/08/14 2252  WBC 7.0 6.8  NEUTROABS 5.4 4.8  HGB 13.4 12.1*  HCT 41.3 36.9*  MCV 91.5 93.4  PLT 203 143*   Cardiac Enzymes: No results for input(s): CKTOTAL, CKMB, CKMBINDEX, TROPONINI in the last 168 hours.  BNP (last 3 results) No results for input(s): PROBNP in the last 8760 hours. CBG: No results for input(s): GLUCAP in the last 168 hours.  Radiological Exams on Admission: Dg Chest Port 1 View  03/08/2014   CLINICAL DATA:  Multiple myeloma.  Fever.  EXAM: PORTABLE CHEST - 1 VIEW  COMPARISON:  Most recent chest x-ray comparison 11/29/2007  FINDINGS: Normal heart size. Stable mild aortic tortuosity. There is minimal atelectasis or scarring at the left base. There is no edema, consolidation, effusion, or pneumothorax. Myelomatous changes, best visualized in the bilateral clavicles.  IMPRESSION: Negative for pneumonia.   Electronically Signed   By: Jorje Guild M.D.   On: 03/08/2014 22:20    Assessment/Plan Principal Problem:   Fever Active Problems:   Multiple myeloma   Amyloidosis   Diabetes mellitus without complication   HLD (hyperlipidemia)   GERD (gastroesophageal reflux disease)  Fever: Unclear etiology. Urinalysis is negative, chest x-ray is negative, no other signs of infection except for mild sore throat. It is likely due to viral infection, however given his immunosuppressed status secondary to chemotherapy, will need to start antibiotics with broad coverage pending further workup. The only positive findings on physical examination is bilateral fine crackles over the bases, indicating possible early stage of pneumonia. -Admitted to telemetry bed -IV vancomycin and cefepime -IV fluids: Normal saline 1 25 mL per hour -Blood culture and urine culture -Repeat chest x-ray in the morning  Multiple myeloma: Patient has  been treated with chemotherapy in the past 2 weeks by Dr. Alvy Bimler. Hemoglobin 12.1, leukocyte 6.8, platelet 143 on admission. -Follow-up with Dr. Alvy Bimler at discharge  Diabetes mellitus: No A1c is recorded. Patient is on Lantus and metformin at home.  -Lantus 5 units daily -Sliding-scale insulin -Check A1c  GERD: -Protonix  Hyperlipidemia: Patient is on pravastatin at home -Continue pravastatin -Check lipid profile   DVT ppx: SQ Heparin      Code Status: Full code Family Communication: Yes, patient's   husband    at bed side Disposition Plan: Admit to inpatient   Date of Service 03/09/2014    Ivor Costa [facility] Pager [phone]  If 7PM-7AM, please contact night-coverage [email or web address] Password [email or web address] 03/09/2014, 4:00 AM

## 2014-03-09 NOTE — Progress Notes (Signed)
Called [facility] [phone] spoke to Farley and the Zolpidem 10 mg has been approved from 02/16/14 to 03/09/15 Case ID [id number]. Called [facility] [phone] spoke to [place]. Called patient and he is in the hospital w/ fever admitted last night and is not sure if Dr. Alvy Bimler knows or not.

## 2014-03-09 NOTE — Progress Notes (Signed)
RECEIVED A FAX FROM [facility] CONCERNING A PRIOR AUTHORIZATION FOR ZOLPIDEM. THIS REQUEST WAS PLACED IN THE MANAGED CARE BIN.

## 2014-03-09 NOTE — Progress Notes (Signed)
Tyler Willis   DOB:02/14/1951   JO#:[id number]   NOB#:[id number]  Patient Care Team: Hulan Fess, MD as PCP - General (Family Medicine) Heath Lark, MD as Consulting Physician (Hematology and Oncology) I have seen the patient, examined him and edited the notes as follows  Subjective: Tyler Willis is a 63 year old man with a recent diagnosis of Multiple Myeloma and Amyloidosis, on current chemotherapy with Velcade, Cytoxan and Dexamethasone, s/p Cycle 1 Day 1 on 02/26/14, admitted on 12/17 with fevers up to 101.5, chills, sore throat and dry cough requiring further evaluation. He denies any night sweats, chest pain or shortness of breath. He denies abdominal pain. He does complain of intermittent nausea and vomiting. No diarrhea. He denies any joint pain. No confusion was reported. Denies headaches or vision changes. Denies focal neurological weakness. He denies any bleeding issues. He denies sick contacts. Chest X Ray was negative for acute disease. Urinalysis was negative for UTI. CBC was essentially unremarkable in view of recent chemotherapy. He was placed on I V Cefepime and Vancomycin, cultures are pending.   SUMMARY OF ONCOLOGIC HISTORY: Oncology History   ISS Staging II, M-spike 1.7, IgG 2420, kappa 30.1, beta28mcroglobulin 4.48, Creatinine 1.2, calcium 9.8, hemoglobin 13.2, albumin 4.2     Multiple myeloma   01/10/2003 Pathology Results [phone] pathology showed pure seminoma with lymphovascular invasion, pT2 NX MX   01/10/2003 Surgery He had chronic orchiectomy for testicle of cancer   03/31/2013 Imaging CT scan of the chest show bilateral rib fractures and three-vessel coronary artery calcification   02/01/2014 Imaging CT scan showed multiple lytic lesions involving the bony structures most prominent in the region of the left maxillary antrum and bilateral ribs with pathological fractures    02/12/2014 Bone Marrow Biopsy sternal bone marrow aspiration and  biopsy revealed 75% plasma cells, Kappa-restricted. congo red staging reveals amyloid deposition within some of the amorphous material and within blood vessels. Normal cytogenetics.   02/14/2014 Imaging PET CT scan showed diffuse skeletal involvement   02/26/2014 -  Chemotherapy He is started on chemotherapy with Velcade, Cytoxan and dexamethasone.   03/08/14 Imaging Chest X Ray negative for acute disease        Scheduled Meds: . acyclovir  400 mg Oral Daily  . ceFEPime (MAXIPIME) IV  2 g Intravenous Q8H  . heparin  5,000 Units Subcutaneous 3 times per day  . insulin aspart  0-9 Units Subcutaneous TID WC  . insulin glargine  5 Units Subcutaneous QHS  . magnesium oxide  400 mg Oral Daily  . multivitamin with minerals  1 tablet Oral Daily  . pantoprazole  40 mg Oral Daily  . [START ON 03/10/2014] pneumococcal 23 valent vaccine  0.5 mL Intramuscular Tomorrow-1000  . polyethylene glycol  17 g Oral Daily  . pravastatin  40 mg Oral Daily  . psyllium  1 packet Oral Daily  . sodium chloride  3 mL Intravenous Q12H  . vancomycin  1,250 mg Intravenous Q12H   Continuous Infusions: . sodium chloride 125 mL/hr at 03/09/14 0343   PRN Meds:guaiFENesin-codeine, ibuprofen, ondansetron, oxyCODONE, oxyCODONE-acetaminophen, prochlorperazine, zolpidem   Objective:  Filed Vitals:   03/09/14 0530  BP: 111/56  Pulse: 73  Temp:   Resp: 20      Intake/Output Summary (Last 24 hours) at 03/09/14 1153 Last data filed at 03/09/14 0730  Gross per 24 hour  Intake      0 ml  Output   1325 ml  Net  -1325 ml  ECOG PERFORMANCE STATUS: 2  GENERAL:alert,ill appearing, uncomfortable due to nausea and one episode of vomiting (non bloody emesis, mucoid, non bilous) while examining the patient SKIN: skin color, texture, turgor are normal, mild rashes likely from Velcade or significant lesions EYES: normal, conjunctiva are pink and non-injected, sclera clear OROPHARYNX:no exudate, no erythema and  lips, buccal mucosa, and tongue normal  NECK: supple, thyroid normal size, non-tender, without nodularity LYMPH:  no palpable lymphadenopathy in the cervical, axillary or inguinal LUNGS:trace of crackles audible at the bases without wheezing and rhonchi HEART: regular rate & rhythm and no murmurs and no lower extremity edema ABDOMEN:abdomen soft, non-tender and normal bowel sounds Musculoskeletal:no cyanosis of digits and no clubbing  PSYCH: alert & oriented x 3 with fluent speech NEURO: no focal motor/sensory deficits    CBG (last 3)  No results for input(s): GLUCAP in the last 72 hours.   Labs:   Recent Labs Lab 03/05/14 0959 03/08/14 2252 03/09/14 0440  WBC 7.0 6.8 5.1  HGB 13.4 12.1* 10.4*  HCT 41.3 36.9* 31.4*  PLT 203 143* 126*  MCV 91.5 93.4 92.4  MCH 29.6 30.6 30.6  MCHC 32.4 32.8 33.1  RDW 14.6 15.0 15.0  LYMPHSABS 1.0 1.0  --   MONOABS 0.4 1.0  --   EOSABS 0.2 0.1  --   BASOSABS 0.0 0.0  --      Chemistries:    Recent Labs Lab 03/05/14 0959 03/08/14 2206 03/09/14 0440  NA 135* 139 141  K 4.0 4.2 3.8  CL  --  102 109  CO2 24 26 23   GLUCOSE 121 96 90  BUN 20.7 17 15   CREATININE 1.3 1.29 1.29  CALCIUM 9.5 9.0 7.9*  AST 36* 53* 50*  ALT 54 71* 69*  ALKPHOS 100 92 73  BILITOT 0.89 0.3 0.2*    GFR Estimated Creatinine Clearance: 66 mL/min (by C-G formula based on Cr of 1.29).  Liver Function Tests:  Recent Labs Lab 03/05/14 0959 03/08/14 2206 03/09/14 0440  AST 36* 53* 50*  ALT 54 71* 69*  ALKPHOS 100 92 73  BILITOT 0.89 0.3 0.2*  PROT 8.0 7.4 6.0  ALBUMIN 4.0 3.5 2.8*   No results for input(s): LIPASE, AMYLASE in the last 168 hours. No results for input(s): AMMONIA in the last 168 hours.  Urine Studies     Component Value Date/Time   COLORURINE YELLOW 03/08/2014 2336   APPEARANCEUR CLEAR 03/08/2014 2336   LABSPEC 1.016 03/08/2014 2336   PHURINE 5.0 03/08/2014 2336   GLUCOSEU NEGATIVE 03/08/2014 2336   HGBUR NEGATIVE  03/08/2014 2336   BILIRUBINUR NEGATIVE 03/08/2014 2336   KETONESUR NEGATIVE 03/08/2014 2336   PROTEINUR NEGATIVE 03/08/2014 2336   UROBILINOGEN 0.2 03/08/2014 2336   NITRITE NEGATIVE 03/08/2014 2336   LEUKOCYTESUR NEGATIVE 03/08/2014 2336    Coagulation profile  Recent Labs Lab 03/09/14 0440  INR 1.12   Hgb A1c  Recent Labs  03/09/14 0440  HGBA1C 6.7*   Lipid Profile  Recent Labs  03/09/14 0440  CHOL 123  HDL 19*  LDLCALC 63  TRIG 205*  CHOLHDL 6.5   Microbiology No results found for this or any previous visit (from the past 240 hour(s)).   Imaging Studies: I reviewed the imaging Dg Chest Port 1 View  03/08/2014   CLINICAL DATA:  Multiple myeloma.  Fever.  EXAM: PORTABLE CHEST - 1 VIEW  COMPARISON:  Most recent chest x-ray comparison 11/29/2007  FINDINGS: Normal heart size. Stable mild aortic tortuosity. There is  minimal atelectasis or scarring at the left base. There is no edema, consolidation, effusion, or pneumothorax. Myelomatous changes, best visualized in the bilateral clavicles.  IMPRESSION: Negative for pneumonia.   Electronically Signed   By: Jorje Guild M.D.   On: 03/08/2014 22:20    Assessment/Plan: 63 y.o. Multiple myeloma Complex situation due to concurrent diagnosis of multiple myeloma and amyloidosis at the same time. Currently he is s/p C1 D1 chemo on 12/7 with dexamethasone 20 mg weekly, Cytoxan 300 mg weekly and Velcade on days 1, 4, 7 and 11. He has been seen by Dr. Harvel Ricks on 12/17, who recommends completion of 4 cycles of chemo before concluding candidacy of the patient for bone marrow transplant. Dental clearance pending to proceed with Zometa. In the meantime, he will continue on vitamin D and calcium supplements  Amyloidosis Will order cardiac biomarkers in the future. Recommend sending his tissue block to [facility] for mass spectrometry for tissue subtype. Suspected that systemic amyloidosis due to significant presence of significant  plasma cells.  As mentioned above, combination therapy with Cytoxan, dexamethasone and Velcade is an approved regimen for amyloidosis in addition to treat multiple myeloma. The dosing for amyloidosis is quite different compared to the dosing for multiple myeloma however.  Due to significant presence of plasma cells in his bone marrow, he is being treated aggressively with twice-weekly regimen of Velcade rather than weekly. Cardiology evaluation is scheduled for January to exclude cardiac amyloidosis   Fever In the setting of recent chemotherapy and possible infectious illness, rule out bacterial versus viral. Cultures are pending. Chest X ray and Urinalysis are negative No neutropenia noted to date, although patient has just received steroids for the treatment of his oncological illness, which could mask low WBC count. Continue IV antibiotics with Vanco and Cefepime, antivirals, IV fluids, antipyretics.  Monitor counts closely  Nausea and Vomiting Likely due to acute respiratory illness (mucus seen in emesis), as well as recent chemotherapy side effects. IV antiemetics recommended, Hospitalist has been informed. Monitor electrolytes closely.  Thrombocytopenia This is due to acute  Illness, malignancy, as well as recent chemotherapy, and antibiotics No bleeding issues reported. No transfusion indicated at this time, unless platelets less than 10,000 or 20000 if bleeding.  Discontinue Heparin for platelets less than 50,000 Continue aspirin for now.  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 He has a history of bleeding hemorrhoid, could be exacerbated by aspirin. continue to monitor for now.  No transfusion needed unless Hb drops to 7 or bleeding issues occur.   Coronary artery calcification seen on CAT scan This patient have significant risk factors for coronary artery  disease. On aspirin therapy.  Cardiologist to follow in January of 2016 for medical management and to exclude cardiac involvement by amyloidosis as this would impact decision for bone marrow transplant in the future  Mandibular lesion The patient would be candidate for IV bisphosphonate. There is concern about the mandible lesion and risk of osteonecrosis of the jaw. Dental clearance is recommended prior to starting him on treatment with Zometa, to be given in the out-patient  Diabetes mellitus without complication On Lantus. Sliding scale insulin  AIC is pending  DVT prophylaxis On Heparin subq   Full Code  Discharge planning If afebrile in the next 24-48 hours, he can be DC with oral antibiotics. I will return to see him on Monday for further follow-up  Other  medical issues including hypertension and hyperlipidemia, GERD as per admitting team   **Disclaimer: This note was dictated with voice recognition software. Similar sounding words can inadvertently be transcribed and this note may contain transcription errors which may not have been corrected upon publication of note.Sharene Butters E, PA-C 03/09/2014  11:53 AM Krystian Ferrentino, MD 03/09/2014

## 2014-03-09 NOTE — Progress Notes (Signed)
ANTIBIOTIC CONSULT NOTE - INITIAL  Pharmacy Consult for Cefepime/Vancomycin Indication: Fever of unclear etiology  No Known Allergies  Patient Measurements: Height: 6' (182.9 cm) Weight: 203 lb 14.8 oz (92.5 kg) IBW/kg (Calculated) : 77.6   Vital Signs: Temp: 98.2 F (36.8 C) (12/18 0150) Temp Source: Oral (12/18 0150) BP: 108/57 mmHg (12/18 0150) Pulse Rate: 77 (12/18 0150) Intake/Output from previous day: 12/17 0701 - 12/18 0700 In: 0  Out: 200 [Urine:200] Intake/Output from this shift: Total I/O In: 0  Out: 200 [Urine:200]  Labs:  Recent Labs  03/08/14 2206 03/08/14 2252  WBC  --  6.8  HGB  --  12.1*  PLT  --  143*  CREATININE 1.29  --    Estimated Creatinine Clearance: 66 mL/min (by C-G formula based on Cr of 1.29). No results for input(s): VANCOTROUGH, VANCOPEAK, VANCORANDOM, GENTTROUGH, GENTPEAK, GENTRANDOM, TOBRATROUGH, TOBRAPEAK, TOBRARND, AMIKACINPEAK, AMIKACINTROU, AMIKACIN in the last 72 hours.   Microbiology: No results found for this or any previous visit (from the past 720 hour(s)).  Medical History: Past Medical History  Diagnosis Date  . Diabetes mellitus without complication   . GERD (gastroesophageal reflux disease)   . 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 Multiple myeloma 02/19/2014    Medications:  Scheduled:  . acyclovir  400 mg Oral Daily  . ceFEPime (MAXIPIME) IV  2 g Intravenous Q8H  . heparin  5,000 Units Subcutaneous 3 times per day  . insulin aspart  0-9 Units Subcutaneous TID WC  . insulin glargine  5 Units Subcutaneous QHS  . magnesium oxide  400 mg Oral Daily  . multivitamin with minerals  1 tablet Oral Daily  . pantoprazole  40 mg Oral Daily  . polyethylene glycol  17 g Oral Daily  . pravastatin  40 mg Oral Daily  . psyllium  1 packet Oral Daily  . sodium chloride  3 mL Intravenous Q12H  . vancomycin  1,250 mg Intravenous Q12H   Infusions:  . sodium chloride 125 mL/hr at 03/09/14 0343   Assessment: 53 yoM with hx of  multiple myeloma now with fever.  Infectious source unclear.  Empiric Cefepime and Vancomycin per Rx.   Goal of Therapy:  Vancomycin trough level 15-20 mcg/ml  Plan:   Cefepime 2gm IV q8h  Vancomycin 1557m x1 then 12578mIV q12h  F/u scr/levels/cultures  GrLawana Pai 03/09/2014,4:41 AM

## 2014-03-09 NOTE — Progress Notes (Signed)
Utilization review completed.  

## 2014-03-09 NOTE — Progress Notes (Signed)
Patient seen and examined. Admitted after midnights Secondary to high grade fever. Patient has hx of MM and is actively receiving chemotherapy. CXR is neg for infiltrates, no findings in UA to suggest UTI and normal WBC's and differential. Could be secondary to viral URI (patient complaining of sore throat and dry cough). Please referred to H&P as written by Dr. Blaine Hamper for further info/details on admission.   Plan: -follow blood cx's -continue broad spectrum antibiotics -follow repeated CXR on 12/19 -continue PRN antipyretics  Barton Dubois [phone]

## 2014-03-10 ENCOUNTER — Inpatient Hospital Stay (HOSPITAL_COMMUNITY): Payer: BC Managed Care – PPO

## 2014-03-10 DIAGNOSIS — J189 Pneumonia, unspecified organism: Secondary | ICD-10-CM

## 2014-03-10 LAB — CBC
HCT: 32.2 % — ABNORMAL LOW (ref 39.0–52.0)
Hemoglobin: 10.5 g/dL — ABNORMAL LOW (ref 13.0–17.0)
MCH: 30.3 pg (ref 26.0–34.0)
MCHC: 32.6 g/dL (ref 30.0–36.0)
MCV: 93.1 fL (ref 78.0–100.0)
PLATELETS: 113 10*3/uL — AB (ref 150–400)
RBC: 3.46 MIL/uL — AB (ref 4.22–5.81)
RDW: 15.4 % (ref 11.5–15.5)
WBC: 4.3 10*3/uL (ref 4.0–10.5)

## 2014-03-10 LAB — GLUCOSE, CAPILLARY
GLUCOSE-CAPILLARY: 106 mg/dL — AB (ref 70–99)
GLUCOSE-CAPILLARY: 134 mg/dL — AB (ref 70–99)
GLUCOSE-CAPILLARY: 153 mg/dL — AB (ref 70–99)
Glucose-Capillary: 122 mg/dL — ABNORMAL HIGH (ref 70–99)

## 2014-03-10 LAB — BASIC METABOLIC PANEL
ANION GAP: 9 (ref 5–15)
BUN: 11 mg/dL (ref 6–23)
CALCIUM: 8.6 mg/dL (ref 8.4–10.5)
CHLORIDE: 105 meq/L (ref 96–112)
CO2: 24 meq/L (ref 19–32)
Creatinine, Ser: 1.26 mg/dL (ref 0.50–1.35)
GFR calc Af Amer: 69 mL/min — ABNORMAL LOW (ref >90)
GFR calc non Af Amer: 60 mL/min — ABNORMAL LOW (ref >90)
Glucose, Bld: 86 mg/dL (ref 70–99)
Potassium: 4.1 mEq/L (ref 3.7–5.3)
Sodium: 138 mEq/L (ref 137–147)

## 2014-03-10 LAB — URINE CULTURE
Colony Count: NO GROWTH
Culture: NO GROWTH

## 2014-03-10 MED ORDER — DIPHENHYDRAMINE HCL 25 MG PO CAPS
25.0000 mg | ORAL_CAPSULE | Freq: Once | ORAL | Status: AC
  Administered 2014-03-10: 25 mg via ORAL
  Filled 2014-03-10: qty 1

## 2014-03-10 MED ORDER — ACETAMINOPHEN 325 MG PO TABS
650.0000 mg | ORAL_TABLET | Freq: Four times a day (QID) | ORAL | Status: DC | PRN
  Administered 2014-03-10 – 2014-03-11 (×2): 650 mg via ORAL
  Filled 2014-03-10 (×3): qty 2

## 2014-03-10 NOTE — Progress Notes (Signed)
PROGRESS NOTE  Tyler Willis TLX:[id number] DOB: [birth year] DOA: 03/08/2014 PCP: Gennette Pac, MD  Brief history 63 year old male with a history of myeloma, diabetes mellitus type 2, amyloidosis, testis seminoma, GERD, hyperlipidemia, presented with one-day history of fever up to 101.56F. Patient called cancer center and advised to come to the emergency room for further evaluation and treatment. Of note, patient complaining of sore throat with nonproductive cough with associated nausea and vomiting.. Patient states that he last received chemotherapy 03/08/2014. Workup in the emergency department was negative for pyuria, and chest x-ray was negative for infiltrates. The patient was started on intermittent vancomycin and cefepime pending culture data. Assessment/Plan: Sepsis -Present at the time of admission -Secondary to pneumonia -03/10/2014 chest x-ray shows patchy left lower lobe infiltrate with small left effusion -Continue intravenous vancomycin and cefepime pending culture data -IV fluids -Check influenza PCR Myeloma -he is receiving Velcade, Cytoxan and dexamethasone from Dr. Alvy Bimler -appreciate her followup -day 1 of recent cycle was 03/08/14 Amyloidosis -per Dr. Alvy Bimler -Cardiology evaluation is scheduled for January to exclude cardiac amyloidosis  Thrombocytopenia -This is due to acute Illness, malignancy, as well as recent chemotherapy, and antibiotics -No bleeding issues reported. -No transfusion indicated at this time, unless platelets less than 10,000 or 20000 if bleeding.  -Discontinue Heparin for platelets less than 50,000 diabetes mellitus type 2  - 03/09/2014 hemoglobin A1c 6.7  - continue Lantus 5 units daily  - NovoLog sliding scale  hyperlipidemia -Continue statin    Family Communication:   Wife updated at beside Disposition Plan:   Home when medically stable       Procedures/Studies: Dg Chest 2 View  03/10/2014   CLINICAL DATA:   Fever and cough after chemotherapy; multiple myeloma  EXAM: CHEST  2 VIEW  COMPARISON:  March 08, 2014  FINDINGS: There is patchy consolidation in the left base with small left effusion. Elsewhere lungs are clear. Heart size and pulmonary vascularity are within normal limits. No adenopathy. Changes of multiple myeloma are again noted, primarily in the clavicles.  IMPRESSION: Patchy consolidation left base. Small left effusion. Lungs otherwise clear. Scattered bony changes consistent with known multiple myeloma.   Electronically Signed   By: Lowella Grip M.D.   On: 03/10/2014 08:09   Nm Pet Image Initial (pi) Whole Body  02/14/2014   CLINICAL DATA:  Initial treatment strategy for multiple lytic osseous lesions. History of testicular cancer status post right orchiectomy and chemotherapy.  EXAM: NUCLEAR MEDICINE PET WHOLE BODY  TECHNIQUE: 9.4 MCi F-18 FDG was injected intravenously. CT data was obtained and used for attenuation correction and anatomic localization only. (This was not acquired as a diagnostic CT examination.) Additional exam technical data entered on technologist worksheet.  FASTING BLOOD GLUCOSE:  Value: 81 mg/dl  COMPARISON:  CT of the chest, abdomen and pelvis 02/02/2014.  FINDINGS: Head/Neck: No hypermetabolic lymph nodes in the neck. Large rim calcified nodule in the right lobe of the thyroid gland measuring 3.7 x 3.0 cm is similar to prior studies, and demonstrates no internal hypermetabolism.  Chest: 9 mm short axis left supraclavicular lymph node is mildly hypermetabolic (SUVmax = 3.4). No other hypermetabolic mediastinal or hilar nodes. No suspicious pulmonary nodules on the CT scan. Heart size is normal. There is no significant pericardial fluid, thickening or pericardial calcification. There is atherosclerosis of the thoracic aorta, the great vessels of the mediastinum and the coronary arteries, including calcified atherosclerotic plaque in the left anterior  descending, left  circumflex and right coronary arteries.  Abdomen/Pelvis: No abnormal hypermetabolic activity within the liver, pancreas, adrenal glands, or spleen. No hypermetabolic lymph nodes in the abdomen or pelvis. 2.3 cm low-attenuation lesion in the posterior aspect of the upper pole of the right kidney is incompletely characterized on today's non contrast CT examination, but appeared to be a simple cyst on the prior contrast-enhanced exam. Numerous colonic diverticulae are noted, without surrounding inflammatory changes to suggest an acute diverticulitis at this time. There is a large amount of presumably physiologic activity throughout the distal colon and rectum. However, in the distal rectum (image 216 of series 3) there is some asymmetric somewhat mass-like thickening that is focally hypermetabolic (SUVmax = 85.6), and there is a small amount of hypermetabolism in the anal canal (SUVmax = 7.7). Both of these regions are nonspecific, and could certainly be physiologic. Status post left orchiectomy.  Skeleton: Innumerable lytic lesions throughout the axial skeleton, many of which demonstrate hypermetabolism. Specific examples include a large lytic lesion in the sternum (SUVmax = 4.4)that expansile lytic lesion in the left posterior fifth rib (SUVmax = 4.0), and numerous expansile lytic rib lesions bilaterally. A healing nondisplaced pathologic fracture through the posterolateral aspect of the right ninth rib. Nondisplaced fracture of the posterior seventh ribs bilaterally, presumably pathologic. Healing nondisplaced pathologic fracture of the posterolateral aspect of the left ninth rib. No definite pathologic compression fractures are identified at this time. Grade 2 anterolisthesis of L5 upon S1 is incidentally noted. Innumerable lytic lesions throughout the skull also noted, but these do not demonstrate clear hypermetabolism. Large lytic lesion in the angle of the mandible on the right side.  Extremeties: There are  innumerable tiny lytic lesions throughout the visualized appendicular skeleton, but none of these demonstrate definite hypermetabolism.  IMPRESSION: 1. Innumerable lytic lesions throughout the visualized axial and appendicular skeleton. The appearance is most compatible with multiple myeloma, as detailed above. At this time, there are multiple pathologic rib fractures, as detailed above. 2. There is a large amount of metabolic activity throughout the distal colon and rectum, the majority which is favored to be physiologic. Some slightly asymmetric somewhat mass like soft tissue thickening is noted in the region of the distal rectum, and there is some focal hypermetabolism in this region, as well as a adjacent anal canal. The possibility of underlying and anorectal neoplasm should be considered, and correlation with physical examination and colonoscopy is suggested in the near future if not already recently performed. 3. Borderline enlarged mildly hypermetabolic left supraclavicular lymph node. 4. Extensive colonic diverticulosis without findings to suggest acute diverticulitis at this time. 5. Additional incidental findings, as above.   Electronically Signed   By: Vinnie Langton M.D.   On: 02/14/2014 14:45   Ct Biopsy  02/12/2014   CLINICAL DATA:  Multiple lytic bone lesions. The patient requires biopsy for diagnostic purposes and also sampling of bone marrow. Lytic lesion of the sternum is present and has been determined to be the single best target for both lesion biopsy and bone marrow sampling.  EXAM: 1. CT GUIDED ASPIRATE AND CORE BIOPSY OF STERNAL BONE MARROW 2. CT-GUIDED CORE BIOPSY OF STERNAL BONE LESION  ANESTHESIA/SEDATION: Versed 2.0 mg IV, Fentanyl 100 mcg IV  Total Moderate Sedation Time  15 minutes.  PROCEDURE: The procedure risks, benefits, and alternatives were explained to the patient. Questions regarding the procedure were encouraged and answered. The patient understands and consents to the  procedure.  The chest wall was prepped with  Betadine. Sterile gown and sterile gloves were used for the procedure. Local anesthesia was provided with 1% Lidocaine. A time-out procedure was performed.  Under CT guidance, an 11 gauge bone cutting needle was advanced into the sternum. Needle positioning was confirmed with CT. Initial non heparinized and heparinized aspirate samples were obtained of bone marrow.  Core biopsy was performed through the 11 gauge needle. Core biopsy was performed for marrow analysis as well as histologic analysis. The histologic specimen was placed in formalin.  COMPLICATIONS: None  FINDINGS: Lytic lesion of the sternum causing cortical destruction was targeted. Aspirate and core samples were obtained. There were no immediate complications.  IMPRESSION: CT guided sternal biopsy with samples obtained of a bone lesion as well as sternal bone marrow.   Electronically Signed   By: Aletta Edouard M.D.   On: 02/12/2014 11:28   Dg Chest Port 1 View  03/08/2014   CLINICAL DATA:  Multiple myeloma.  Fever.  EXAM: PORTABLE CHEST - 1 VIEW  COMPARISON:  Most recent chest x-ray comparison 11/29/2007  FINDINGS: Normal heart size. Stable mild aortic tortuosity. There is minimal atelectasis or scarring at the left base. There is no edema, consolidation, effusion, or pneumothorax. Myelomatous changes, best visualized in the bilateral clavicles.  IMPRESSION: Negative for pneumonia.   Electronically Signed   By: Jorje Guild M.D.   On: 03/08/2014 22:20         Subjective: Patient denies fevers, chills, headache, chest pain, dyspnea, nausea, vomiting, diarrhea, abdominal pain, dysuria, hematuria.  His vomiting has improved. He is tolerating his diet.   Objective: Filed Vitals:   03/09/14 2047 03/10/14 0150 03/10/14 0208 03/10/14 0618  BP: 97/54  111/56 105/68  Pulse: 70 66 67 71  Temp: 97.9 F (36.6 C) 98.6 F (37 C) 99.4 F (37.4 C) 99.3 F (37.4 C)  TempSrc: Oral Oral Oral Oral    Resp: 20 20  20   Height:      Weight:    91.354 kg (201 lb 6.4 oz)  SpO2: 95% 96%  93%    Intake/Output Summary (Last 24 hours) at 03/10/14 8299 Last data filed at 03/09/14 1834  Gross per 24 hour  Intake 1660.83 ml  Output    200 ml  Net 1460.83 ml   Weight change: -1.146 kg (-2 lb 8.4 oz) Exam:   General:  Pt is alert, follows commands appropriately, not in acute distress  HEENT: No icterus, No thrush, No meningismus, [place]/AT  Cardiovascular: RRR, S1/S2, no rubs, no gallops  Respiratory:  left basilar crackles. No wheezing. Right clear to auscultation.  Abdomen: Soft/+BS, non tender, non distended, no guarding  Extremities: No edema, No lymphangitis, No petechiae, No rashes, no synovitis  Data Reviewed: Basic Metabolic Panel:  Recent Labs Lab 03/05/14 0959 03/08/14 2206 03/09/14 0440 03/10/14 0410  NA 135* 139 141 138  K 4.0 4.2 3.8 4.1  CL  --  102 109 105  CO2 24 26 23 24   GLUCOSE 121 96 90 86  BUN 20.7 17 15 11   CREATININE 1.3 1.29 1.29 1.26  CALCIUM 9.5 9.0 7.9* 8.6   Liver Function Tests:  Recent Labs Lab 03/05/14 0959 03/08/14 2206 03/09/14 0440  AST 36* 53* 50*  ALT 54 71* 69*  ALKPHOS 100 92 73  BILITOT 0.89 0.3 0.2*  PROT 8.0 7.4 6.0  ALBUMIN 4.0 3.5 2.8*   No results for input(s): LIPASE, AMYLASE in the last 168 hours. No results for input(s): AMMONIA in the last 168 hours.  CBC:  Recent Labs Lab 03/05/14 0959 03/08/14 2252 03/09/14 0440 03/10/14 0410  WBC 7.0 6.8 5.1 4.3  NEUTROABS 5.4 4.8  --   --   HGB 13.4 12.1* 10.4* 10.5*  HCT 41.3 36.9* 31.4* 32.2*  MCV 91.5 93.4 92.4 93.1  PLT 203 143* 126* 113*   Cardiac Enzymes: No results for input(s): CKTOTAL, CKMB, CKMBINDEX, TROPONINI in the last 168 hours. BNP: Invalid input(s): POCBNP CBG:  Recent Labs Lab 03/09/14 0732 03/09/14 1159 03/09/14 2054 03/10/14 0721  GLUCAP 83 101* 112* 122*    Recent Results (from the past 240 hour(s))  Urine culture     Status: None    Collection Time: 03/08/14 11:36 PM  Result Value Ref Range Status   Specimen Description URINE, CLEAN CATCH  Final   Special Requests NONE  Final   Culture  Setup Time   Final    03/09/2014 05:07 Performed at [place] Performed at [facility]   Final   Culture NO GROWTH Performed at [facility]   Final   Report Status 03/10/2014 FINAL  Final     Scheduled Meds: . acyclovir  400 mg Oral Daily  . ceFEPime (MAXIPIME) IV  2 g Intravenous Q8H  . heparin  5,000 Units Subcutaneous 3 times per day  . insulin aspart  0-9 Units Subcutaneous TID WC  . insulin glargine  5 Units Subcutaneous QHS  . magnesium oxide  400 mg Oral Daily  . multivitamin with minerals  1 tablet Oral Daily  . pantoprazole  40 mg Oral Daily  . pneumococcal 23 valent vaccine  0.5 mL Intramuscular Tomorrow-1000  . polyethylene glycol  17 g Oral Daily  . pravastatin  40 mg Oral Daily  . psyllium  1 packet Oral Daily  . sodium chloride  3 mL Intravenous Q12H  . vancomycin  1,250 mg Intravenous Q12H   Continuous Infusions: . sodium chloride 125 mL/hr at 03/09/14 2245     Deon Duer, DO  [facility] Pager [phone]  If 7PM-7AM, please contact night-coverage [email or web address] Password [email or web address] 03/10/2014, 9:07 AM   LOS: 2 days

## 2014-03-11 DIAGNOSIS — A419 Sepsis, unspecified organism: Principal | ICD-10-CM

## 2014-03-11 LAB — GLUCOSE, CAPILLARY
GLUCOSE-CAPILLARY: 73 mg/dL (ref 70–99)
Glucose-Capillary: 127 mg/dL — ABNORMAL HIGH (ref 70–99)

## 2014-03-11 MED ORDER — LEVOFLOXACIN 750 MG PO TABS: 750.0000 mg | ORAL_TABLET | Freq: Every day | ORAL | Status: DC

## 2014-03-11 NOTE — Discharge Summary (Signed)
Physician Discharge Summary  Tyler Willis BUL:[id number] DOB: 08-18-1950 DOA: 03/08/2014  PCP: Gennette Pac, MD  Admit date: 03/08/2014 Discharge date: 03/11/2014  Recommendations for Outpatient Follow-up:  1. Pt will need to follow up with PCP in 2 weeks post discharge 2. Please obtain BMP and CBC in one week   Discharge Diagnoses:  Sepsis -Present at the time of admission -although the patient's initial chest x-ray did not show any infiltrate, follow-up chest x-ray after rehydration showed left lower lobe patchy infiltrate -Secondary to pneumonia -03/10/2014 chest x-ray shows patchy left lower lobe infiltrate with small left effusion -Continue intravenous vancomycin and cefepime pending culture data -IV fluids Pneumonia -The patient improved clinically with intravenous antibiotics -His respiratory status remained clinically stable without any distress -The patient defervescence without any further fevers during the hospitalization -Blood cultures and urine cultures remained negative -The patient remained hemodynamically stable and clinically improved -he will go home with 5 additional days of levofloxacin 750 mg, this will complete 7 days of therapy  Myeloma -he is receiving Velcade, Cytoxan and dexamethasone from Dr. Alvy Bimler -appreciate her followup -day 1 of recent cycle was 03/08/14 Amyloidosis -per Dr. Alvy Bimler -Cardiology evaluation is scheduled for January to exclude cardiac amyloidosis  Thrombocytopenia -This is due to acute Illness, malignancy, as well as recent chemotherapy, and antibiotics -No bleeding issues reported. -No transfusion indicated at this time, unless platelets less than 10,000 or 20000 if bleeding.  -Discontinue Heparin for platelets less than 50,000 diabetes mellitus type 2  - 03/09/2014 hemoglobin A1c 6.7  - continue Lantus 5 units daily  - NovoLog sliding scale  -restart metformin after d/c hyperlipidemia -Continue  statin     Discharge Condition: stable  Disposition: home  Diet:carb modified Wt Readings from Last 3 Encounters:  03/11/14 92.8 kg (204 lb 9.4 oz)  03/05/14 86.592 kg (190 lb 14.4 oz)  02/22/14 89.903 kg (198 lb 3.2 oz)    History of present illness:  63 year old male with a history of myeloma, diabetes mellitus type 2, amyloidosis, testis seminoma, GERD, hyperlipidemia, presented with one-day history of fever up to 101.87F. Patient called cancer center and advised to come to the emergency room for further evaluation and treatment. Of note, patient complaining of sore throat with nonproductive cough with associated nausea and vomiting.. Patient states that he last received chemotherapy 03/08/2014. Workup in the emergency department was negative for pyuria, and chest x-ray was negative for infiltrates. The patient was started on IV vancomycin and cefepime pending culture data.  The patient was started on intravenous fluids. Follow-up chest x-ray did reveal a left lower lobe patchy infiltrate. The patient was continued on intravenous antibiotics with good clinical response. The patient did not have any further fevers and was afebrile for nearly 48 hours prior to discharge. His blood and urine cultures remained negative. His WBC count remained stable.  He will be discharged home with 5 additional days of levofloxacin which will finish 7 days of therapy.  Consultants: [facility]--Dr. Alvy Bimler  Discharge Exam: Filed Vitals:   03/11/14 0549  BP: 138/87  Pulse: 72  Temp: 98.7 F (37.1 C)  Resp: 20   Filed Vitals:   03/10/14 0618 03/10/14 1444 03/11/14 0500 03/11/14 0549  BP: 105/68 118/63  138/87  Pulse: 71 77  72  Temp: 99.3 F (37.4 C) 98.2 F (36.8 C)  98.7 F (37.1 C)  TempSrc: Oral Oral  Oral  Resp: _0 Height:      Weight: 91.354 kg (201 lb  6.4 oz)  92.8 kg (204 lb 9.4 oz)   SpO2: 93% 97%  97%   General: A&O x 3, NAD, pleasant, cooperative Cardiovascular: RRR, no  rub, no gallop, no S3 Respiratory: left basilar crackles. Right clear to auscultation. Abdomen:soft, nontender, nondistended, positive bowel sounds Extremities: trace LE edema, No lymphangitis, no petechiae  Discharge Instructions     Medication List    STOP taking these medications        naproxen sodium 220 MG tablet  Commonly known as:  ANAPROX     oxyCODONE-acetaminophen 5-325 MG per tablet  Commonly known as:  PERCOCET/ROXICET      TAKE these medications        acyclovir 400 MG tablet  Commonly known as:  ZOVIRAX  Take 1 tablet (400 mg total) by mouth daily.     BAYER CONTOUR NEXT TEST test strip  Generic drug:  glucose blood     cyclophosphamide 50 MG tablet  Commonly known as:  CYTOXAN  Take 6 tablets on days 1,8,15 of chemo 1hr before or 2hr after meals every 21d.     dexamethasone 4 MG tablet  Commonly known as:  DECADRON  Take 10 pills every Monday with breakfast     guaiFENesin-codeine 100-10 MG/5ML syrup  Take 5 mLs by mouth every 6 (six) hours as needed for cough.     insulin glargine 100 UNIT/ML injection  Commonly known as:  LANTUS  Inject 5 Units into the skin at bedtime.     levofloxacin 750 MG tablet  Commonly known as:  LEVAQUIN  Take 1 tablet (750 mg total) by mouth daily.     Magnesium 400 MG Tabs  Take 400 mg by mouth daily.     metFORMIN 500 MG tablet  Commonly known as:  GLUCOPHAGE  Take 500 mg by mouth 2 (two) times daily with a meal.     multivitamin tablet  Take 1 tablet by mouth daily.     omeprazole 20 MG capsule  Commonly known as:  PRILOSEC  Take 20 mg by mouth daily.     ondansetron 8 MG tablet  Commonly known as:  ZOFRAN  Take 1 tablet (8 mg total) by mouth every 8 (eight) hours as needed.     oxyCODONE 5 MG immediate release tablet  Commonly known as:  Oxy IR/ROXICODONE  Take 1 tablet (5 mg total) by mouth every 4 (four) hours as needed for severe pain.     polyethylene glycol packet  Commonly known as:  MIRALAX  / GLYCOLAX  Take 17 g by mouth daily.     pravastatin 40 MG tablet  Commonly known as:  PRAVACHOL  Take 40 mg by mouth daily. Daily     prochlorperazine 10 MG tablet  Commonly known as:  COMPAZINE  Take 1 tablet (10 mg total) by mouth every 6 (six) hours as needed (Nausea or vomiting).     psyllium 0.52 G capsule  Commonly known as:  REGULOID  Take 5 capsules by mouth daily.     zolpidem 10 MG tablet  Commonly known as:  AMBIEN  Take 1 tablet (10 mg total) by mouth at bedtime as needed for sleep.         The results of significant diagnostics from this hospitalization (including imaging, microbiology, ancillary and laboratory) are listed below for reference.    Significant Diagnostic Studies: Dg Chest 2 View  03/10/2014   CLINICAL DATA:  Fever and cough after chemotherapy; multiple myeloma  EXAM: CHEST  2 VIEW  COMPARISON:  March 08, 2014  FINDINGS: There is patchy consolidation in the left base with small left effusion. Elsewhere lungs are clear. Heart size and pulmonary vascularity are within normal limits. No adenopathy. Changes of multiple myeloma are again noted, primarily in the clavicles.  IMPRESSION: Patchy consolidation left base. Small left effusion. Lungs otherwise clear. Scattered bony changes consistent with known multiple myeloma.   Electronically Signed   By: Lowella Grip M.D.   On: 03/10/2014 08:09   Nm Pet Image Initial (pi) Whole Body  02/14/2014   CLINICAL DATA:  Initial treatment strategy for multiple lytic osseous lesions. History of testicular cancer status post right orchiectomy and chemotherapy.  EXAM: NUCLEAR MEDICINE PET WHOLE BODY  TECHNIQUE: 9.4 MCi F-18 FDG was injected intravenously. CT data was obtained and used for attenuation correction and anatomic localization only. (This was not acquired as a diagnostic CT examination.) Additional exam technical data entered on technologist worksheet.  FASTING BLOOD GLUCOSE:  Value: 81 mg/dl  COMPARISON:  CT  of the chest, abdomen and pelvis 02/02/2014.  FINDINGS: Head/Neck: No hypermetabolic lymph nodes in the neck. Large rim calcified nodule in the right lobe of the thyroid gland measuring 3.7 x 3.0 cm is similar to prior studies, and demonstrates no internal hypermetabolism.  Chest: 9 mm short axis left supraclavicular lymph node is mildly hypermetabolic (SUVmax = 3.4). No other hypermetabolic mediastinal or hilar nodes. No suspicious pulmonary nodules on the CT scan. Heart size is normal. There is no significant pericardial fluid, thickening or pericardial calcification. There is atherosclerosis of the thoracic aorta, the great vessels of the mediastinum and the coronary arteries, including calcified atherosclerotic plaque in the left anterior descending, left circumflex and right coronary arteries.  Abdomen/Pelvis: No abnormal hypermetabolic activity within the liver, pancreas, adrenal glands, or spleen. No hypermetabolic lymph nodes in the abdomen or pelvis. 2.3 cm low-attenuation lesion in the posterior aspect of the upper pole of the right kidney is incompletely characterized on today's non contrast CT examination, but appeared to be a simple cyst on the prior contrast-enhanced exam. Numerous colonic diverticulae are noted, without surrounding inflammatory changes to suggest an acute diverticulitis at this time. There is a large amount of presumably physiologic activity throughout the distal colon and rectum. However, in the distal rectum (image 216 of series 3) there is some asymmetric somewhat mass-like thickening that is focally hypermetabolic (SUVmax = 38.2), and there is a small amount of hypermetabolism in the anal canal (SUVmax = 7.7). Both of these regions are nonspecific, and could certainly be physiologic. Status post left orchiectomy.  Skeleton: Innumerable lytic lesions throughout the axial skeleton, many of which demonstrate hypermetabolism. Specific examples include a large lytic lesion in the  sternum (SUVmax = 4.4)that expansile lytic lesion in the left posterior fifth rib (SUVmax = 4.0), and numerous expansile lytic rib lesions bilaterally. A healing nondisplaced pathologic fracture through the posterolateral aspect of the right ninth rib. Nondisplaced fracture of the posterior seventh ribs bilaterally, presumably pathologic. Healing nondisplaced pathologic fracture of the posterolateral aspect of the left ninth rib. No definite pathologic compression fractures are identified at this time. Grade 2 anterolisthesis of L5 upon S1 is incidentally noted. Innumerable lytic lesions throughout the skull also noted, but these do not demonstrate clear hypermetabolism. Large lytic lesion in the angle of the mandible on the right side.  Extremeties: There are innumerable tiny lytic lesions throughout the visualized appendicular skeleton, but none of these demonstrate definite hypermetabolism.  IMPRESSION: 1. Innumerable  lytic lesions throughout the visualized axial and appendicular skeleton. The appearance is most compatible with multiple myeloma, as detailed above. At this time, there are multiple pathologic rib fractures, as detailed above. 2. There is a large amount of metabolic activity throughout the distal colon and rectum, the majority which is favored to be physiologic. Some slightly asymmetric somewhat mass like soft tissue thickening is noted in the region of the distal rectum, and there is some focal hypermetabolism in this region, as well as a adjacent anal canal. The possibility of underlying and anorectal neoplasm should be considered, and correlation with physical examination and colonoscopy is suggested in the near future if not already recently performed. 3. Borderline enlarged mildly hypermetabolic left supraclavicular lymph node. 4. Extensive colonic diverticulosis without findings to suggest acute diverticulitis at this time. 5. Additional incidental findings, as above.   Electronically Signed    By: Vinnie Langton M.D.   On: 02/14/2014 14:45   Ct Biopsy  02/12/2014   CLINICAL DATA:  Multiple lytic bone lesions. The patient requires biopsy for diagnostic purposes and also sampling of bone marrow. Lytic lesion of the sternum is present and has been determined to be the single best target for both lesion biopsy and bone marrow sampling.  EXAM: 1. CT GUIDED ASPIRATE AND CORE BIOPSY OF STERNAL BONE MARROW 2. CT-GUIDED CORE BIOPSY OF STERNAL BONE LESION  ANESTHESIA/SEDATION: Versed 2.0 mg IV, Fentanyl 100 mcg IV  Total Moderate Sedation Time  15 minutes.  PROCEDURE: The procedure risks, benefits, and alternatives were explained to the patient. Questions regarding the procedure were encouraged and answered. The patient understands and consents to the procedure.  The chest wall was prepped with Betadine. Sterile gown and sterile gloves were used for the procedure. Local anesthesia was provided with 1% Lidocaine. A time-out procedure was performed.  Under CT guidance, an 11 gauge bone cutting needle was advanced into the sternum. Needle positioning was confirmed with CT. Initial non heparinized and heparinized aspirate samples were obtained of bone marrow.  Core biopsy was performed through the 11 gauge needle. Core biopsy was performed for marrow analysis as well as histologic analysis. The histologic specimen was placed in formalin.  COMPLICATIONS: None  FINDINGS: Lytic lesion of the sternum causing cortical destruction was targeted. Aspirate and core samples were obtained. There were no immediate complications.  IMPRESSION: CT guided sternal biopsy with samples obtained of a bone lesion as well as sternal bone marrow.   Electronically Signed   By: Aletta Edouard M.D.   On: 02/12/2014 11:28   Dg Chest Port 1 View  03/08/2014   CLINICAL DATA:  Multiple myeloma.  Fever.  EXAM: PORTABLE CHEST - 1 VIEW  COMPARISON:  Most recent chest x-ray comparison 11/29/2007  FINDINGS: Normal heart size. Stable mild  aortic tortuosity. There is minimal atelectasis or scarring at the left base. There is no edema, consolidation, effusion, or pneumothorax. Myelomatous changes, best visualized in the bilateral clavicles.  IMPRESSION: Negative for pneumonia.   Electronically Signed   By: Jorje Guild M.D.   On: 03/08/2014 22:20     Microbiology: Recent Results (from the past 240 hour(s))  Blood Culture (routine x 2)     Status: None (Preliminary result)   Collection Time: 03/08/14 10:06 PM  Result Value Ref Range Status   Specimen Description BLOOD LEFT HAND  Final   Special Requests BOTTLES DRAWN AEROBIC AND ANAEROBIC 10ML  Final   Culture  Setup Time   Final    03/09/2014  04:41 Performed at [facility]   Final           BLOOD CULTURE RECEIVED NO GROWTH TO DATE CULTURE WILL BE HELD FOR 5 DAYS BEFORE ISSUING A FINAL NEGATIVE REPORT Performed at [facility]    Report Status PENDING  Incomplete  Blood Culture (routine x 2)     Status: None (Preliminary result)   Collection Time: 03/08/14 10:22 PM  Result Value Ref Range Status   Specimen Description BLOOD RIGHT ARM  Final   Special Requests BOTTLES DRAWN AEROBIC AND ANAEROBIC 5CC  Final   Culture  Setup Time   Final    03/09/2014 04:40 Performed at [facility]    Culture   Final           BLOOD CULTURE RECEIVED NO GROWTH TO DATE CULTURE WILL BE HELD FOR 5 DAYS BEFORE ISSUING A FINAL NEGATIVE REPORT Performed at [facility]    Report Status PENDING  Incomplete  Urine culture     Status: None   Collection Time: 03/08/14 11:36 PM  Result Value Ref Range Status   Specimen Description URINE, CLEAN CATCH  Final   Special Requests NONE  Final   Culture  Setup Time   Final    03/09/2014 05:07 Performed at [place] Performed at [facility]   Final   Culture NO GROWTH Performed at [facility]   Final   Report Status 03/10/2014 FINAL  Final       Labs: Basic Metabolic Panel:  Recent Labs Lab 03/05/14 0959  03/08/14 2206 03/09/14 0440 03/10/14 0410  NA 135*  --  139 141 138  K 4.0  < > 4.2 3.8 4.1  CL  --   --  102 109 105  CO2 24  --  _0 GLUCOSE 121  --  96 90 86  BUN 20.7  --  _1 CREATININE 1.3  --  1.29 1.29 1.26  CALCIUM 9.5  --  9.0 7.9* 8.6  < > = values in this interval not displayed. Liver Function Tests:  Recent Labs Lab 03/05/14 0959 03/08/14 2206 03/09/14 0440  AST 36* 53* 50*  ALT 54 71* 69*  ALKPHOS 100 92 73  BILITOT 0.89 0.3 0.2*  PROT 8.0 7.4 6.0  ALBUMIN 4.0 3.5 2.8*   No results for input(s): LIPASE, AMYLASE in the last 168 hours. No results for input(s): AMMONIA in the last 168 hours. CBC:  Recent Labs Lab 03/05/14 0959 03/08/14 2252 03/09/14 0440 03/10/14 0410  WBC 7.0 6.8 5.1 4.3  NEUTROABS 5.4 4.8  --   --   HGB 13.4 12.1* 10.4* 10.5*  HCT 41.3 36.9* 31.4* 32.2*  MCV 91.5 93.4 92.4 93.1  PLT 203 143* 126* 113*   Cardiac Enzymes: No results for input(s): CKTOTAL, CKMB, CKMBINDEX, TROPONINI in the last 168 hours. BNP: Invalid input(s): POCBNP CBG:  Recent Labs Lab 03/10/14 1145 03/10/14 1628 03/10/14 2154 03/11/14 0747 03/11/14 1136  GLUCAP 106* 134* 153* 73 127*    Time coordinating discharge:  Greater than 30 minutes  Signed:  Kenyon Eichelberger, DO [facility] Pager: [phone] 03/11/2014, 11:59 AM

## 2014-03-12 ENCOUNTER — Telehealth: Payer: Self-pay | Admitting: *Deleted

## 2014-03-12 LAB — GLUCOSE, CAPILLARY: Glucose-Capillary: 105 mg/dL — ABNORMAL HIGH (ref 70–99)

## 2014-03-12 NOTE — Telephone Encounter (Signed)
Pt asks if he should take his chemo pills today since he just got out of hospital?   Per Dr. Alvy Bimler as long as pt feeling ok he can take his chemo pills.  Instructed his wife ok to take chemo and dexamethasone if pt feeling ok.  She says he is feeling fine and denies any fevers.  He will take his meds today as directed.   She confirmed his appts next week on 12/28.  Instructed her to call if any problems or concerns before next week. She verbalized understanding.

## 2014-03-13 ENCOUNTER — Other Ambulatory Visit (HOSPITAL_COMMUNITY)
Admission: RE | Admit: 2014-03-13 | Discharge: 2014-03-13 | Disposition: A | Discharge status: Home / Self Care | Payer: BC Managed Care – PPO | Source: Ambulatory Visit | Attending: Hematology | Admitting: Hematology

## 2014-03-13 DIAGNOSIS — C9030 Solitary plasmacytoma not having achieved remission: Secondary | ICD-10-CM | POA: Insufficient documentation

## 2014-03-15 LAB — CULTURE, BLOOD (ROUTINE X 2)
CULTURE: NO GROWTH
Culture: NO GROWTH

## 2014-03-19 ENCOUNTER — Encounter: Payer: Self-pay | Admitting: Hematology and Oncology

## 2014-03-19 ENCOUNTER — Telehealth: Payer: Self-pay | Admitting: Hematology and Oncology

## 2014-03-19 ENCOUNTER — Other Ambulatory Visit (HOSPITAL_BASED_OUTPATIENT_CLINIC_OR_DEPARTMENT_OTHER): Payer: BC Managed Care – PPO

## 2014-03-19 ENCOUNTER — Telehealth: Payer: Self-pay | Admitting: *Deleted

## 2014-03-19 ENCOUNTER — Ambulatory Visit (HOSPITAL_BASED_OUTPATIENT_CLINIC_OR_DEPARTMENT_OTHER): Payer: BC Managed Care – PPO

## 2014-03-19 ENCOUNTER — Ambulatory Visit: Payer: BC Managed Care – PPO

## 2014-03-19 ENCOUNTER — Ambulatory Visit (HOSPITAL_BASED_OUTPATIENT_CLINIC_OR_DEPARTMENT_OTHER): Payer: BC Managed Care – PPO | Admitting: Hematology and Oncology

## 2014-03-19 VITALS — BP 121/66 | HR 82 | Temp 98.7°F | Resp 18 | Ht 72.0 in | Wt 190.2 lb

## 2014-03-19 DIAGNOSIS — E119 Type 2 diabetes mellitus without complications: Secondary | ICD-10-CM

## 2014-03-19 DIAGNOSIS — C9000 Multiple myeloma not having achieved remission: Secondary | ICD-10-CM

## 2014-03-19 DIAGNOSIS — E859 Amyloidosis, unspecified: Secondary | ICD-10-CM

## 2014-03-19 DIAGNOSIS — M279 Disease of jaws, unspecified: Secondary | ICD-10-CM

## 2014-03-19 DIAGNOSIS — Z5112 Encounter for antineoplastic immunotherapy: Secondary | ICD-10-CM

## 2014-03-19 LAB — CBC WITH DIFFERENTIAL/PLATELET
BASO%: 1 % (ref 0.0–2.0)
BASOS ABS: 0 10*3/uL (ref 0.0–0.1)
EOS%: 4.2 % (ref 0.0–7.0)
Eosinophils Absolute: 0.2 10*3/uL (ref 0.0–0.5)
HCT: 37.5 % — ABNORMAL LOW (ref 38.4–49.9)
HEMOGLOBIN: 12.2 g/dL — AB (ref 13.0–17.1)
LYMPH%: 24.4 % (ref 14.0–49.0)
MCH: 30 pg (ref 27.2–33.4)
MCHC: 32.7 g/dL (ref 32.0–36.0)
MCV: 91.9 fL (ref 79.3–98.0)
MONO#: 0.4 10*3/uL (ref 0.1–0.9)
MONO%: 9 % (ref 0.0–14.0)
NEUT#: 2.9 10*3/uL (ref 1.5–6.5)
NEUT%: 61.4 % (ref 39.0–75.0)
Platelets: 351 10*3/uL (ref 140–400)
RBC: 4.08 10*6/uL — ABNORMAL LOW (ref 4.20–5.82)
RDW: 15.6 % — AB (ref 11.0–14.6)
WBC: 4.7 10*3/uL (ref 4.0–10.3)
lymph#: 1.1 10*3/uL (ref 0.9–3.3)

## 2014-03-19 LAB — COMPREHENSIVE METABOLIC PANEL (CC13)
ALBUMIN: 3.8 g/dL (ref 3.5–5.0)
ALT: 46 U/L (ref 0–55)
ANION GAP: 9 meq/L (ref 3–11)
AST: 32 U/L (ref 5–34)
Alkaline Phosphatase: 95 U/L (ref 40–150)
BUN: 17.8 mg/dL (ref 7.0–26.0)
CHLORIDE: 103 meq/L (ref 98–109)
CO2: 25 meq/L (ref 22–29)
Calcium: 9.3 mg/dL (ref 8.4–10.4)
Creatinine: 1.2 mg/dL (ref 0.7–1.3)
EGFR: 63 mL/min/{1.73_m2} — ABNORMAL LOW (ref >90)
GLUCOSE: 162 mg/dL — AB (ref 70–140)
Potassium: 3.8 mEq/L (ref 3.5–5.1)
Sodium: 138 mEq/L (ref 136–145)
TOTAL PROTEIN: 7.4 g/dL (ref 6.4–8.3)
Total Bilirubin: 0.88 mg/dL (ref 0.20–1.20)

## 2014-03-19 MED ORDER — ONDANSETRON HCL 8 MG PO TABS
8.0000 mg | ORAL_TABLET | Freq: Once | ORAL | Status: AC
  Administered 2014-03-19: 8 mg via ORAL

## 2014-03-19 MED ORDER — SODIUM CHLORIDE 0.9 % IV SOLN
Freq: Once | INTRAVENOUS | Status: AC
  Administered 2014-03-19: 10:00:00 via INTRAVENOUS

## 2014-03-19 MED ORDER — ZOLEDRONIC ACID 4 MG/100ML IV SOLN
4.0000 mg | Freq: Once | INTRAVENOUS | Status: AC
  Administered 2014-03-19: 4 mg via INTRAVENOUS
  Filled 2014-03-19: qty 100

## 2014-03-19 MED ORDER — BORTEZOMIB CHEMO SQ INJECTION 3.5 MG (2.5MG/ML)
1.3000 mg/m2 | Freq: Once | INTRAMUSCULAR | Status: AC
Start: 2014-03-19 — End: 2014-03-19
  Administered 2014-03-19: 2.75 mg via SUBCUTANEOUS
  Filled 2014-03-19: qty 1.1

## 2014-03-19 MED ORDER — ONDANSETRON HCL 8 MG PO TABS
ORAL_TABLET | ORAL | Status: AC
  Filled 2014-03-19: qty 1

## 2014-03-19 NOTE — Patient Instructions (Signed)

## 2014-03-19 NOTE — Progress Notes (Signed)
[place] OFFICE PROGRESS NOTE  Patient Care Team: Hulan Fess, MD as PCP - General (Family Medicine) Heath Lark, MD as Consulting Physician (Hematology and Oncology)  SUMMARY OF ONCOLOGIC HISTORY: Oncology History   ISS Staging II, M-spike 1.7, IgG 2420, kappa 30.1, beta91mcroglobulin 4.48, Creatinine 1.2, calcium 9.8, hemoglobin 13.2, albumin 4.2     Multiple myeloma   01/10/2003 Pathology Results [id number] pathology showed pure seminoma with lymphovascular invasion, pT2 NX MX   01/10/2003 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 [place] red staing reveals amyloid deposition within some of the amorphous material and within blood vessels. Normal cytogenetics.   02/14/2014 Imaging PET CT scan showed diffuse skeletal involvement   02/26/2014 -  Chemotherapy He is started on chemotherapy with Velcade, Cytoxan and dexamethasone.    INTERVAL HISTORY: Please see below for problem oriented charting. He is seen prior to cycle 2 of chemotherapy. He has fully recovered from recent febrile illness. Denies skin rashes from injection or new peripheral neuropathy  REVIEW OF SYSTEMS:   Constitutional: Denies fevers, chills or abnormal weight loss Eyes: Denies blurriness of vision Ears, nose, mouth, throat, and face: Denies mucositis or sore throat Respiratory: Denies cough, dyspnea or wheezes Cardiovascular: Denies palpitation, chest discomfort or lower extremity swelling Gastrointestinal:  Denies nausea, heartburn or change in bowel  habits Skin: Denies abnormal skin rashes Lymphatics: Denies new lymphadenopathy or easy bruising Neurological:Denies numbness, tingling or new weaknesses Behavioral/Psych: Mood is stable, no new changes  All other systems were reviewed with the patient and are negative.  I have reviewed the past medical history, past surgical history, social history and family history with the patient and they are unchanged from previous note.  ALLERGIES:  has No Known Allergies.  MEDICATIONS:  Current Outpatient Prescriptions  Medication Sig Dispense Refill  . acyclovir (ZOVIRAX) 400 MG tablet Take 1 tablet (400 mg total) by mouth daily. 60 tablet 3  . BAYER CONTOUR NEXT TEST test strip   5  . cyclophosphamide (CYTOXAN) 50 MG tablet Take 6 tablets on days 1,8,15 of chemo 1hr before or 2hr after meals every 21d. 60 tablet 3  . dexamethasone (DECADRON) 4 MG tablet Take 10 pills every Monday with breakfast 90 tablet 1  . guaiFENesin-codeine 100-10 MG/5ML syrup Take 5 mLs by mouth every 6 (six) hours as needed for cough. 473 mL 0  . insulin glargine (LANTUS) 100 UNIT/ML injection Inject 5 Units into the skin at bedtime.    .Marland Kitchenlevofloxacin (LEVAQUIN) 750 MG tablet Take 1 tablet (750 mg total) by mouth daily. 5 tablet 0  . Magnesium 400 MG TABS Take 400 mg by mouth daily.    . metFORMIN (GLUCOPHAGE) 500 MG tablet Take 500 mg by mouth 2 (two) times daily with a meal.    . Multiple Vitamin (MULTIVITAMIN) tablet Take 1 tablet by mouth daily.    .Marland Kitchenomeprazole (PRILOSEC) 20 MG capsule Take 20 mg by mouth daily.    . ondansetron (ZOFRAN) 8 MG tablet Take 1 tablet (8 mg total) by mouth every 8 (eight) hours as needed. 30 tablet 1  .  oxyCODONE (OXY IR/ROXICODONE) 5 MG immediate release tablet Take 1 tablet (5 mg total) by mouth every 4 (four) hours as needed for severe pain. 30 tablet 0  . polyethylene glycol (MIRALAX / GLYCOLAX) packet Take 17 g by mouth daily.    . pravastatin (PRAVACHOL) 40 MG tablet Take 40 mg by  mouth daily. Daily  1  . prochlorperazine (COMPAZINE) 10 MG tablet Take 1 tablet (10 mg total) by mouth every 6 (six) hours as needed (Nausea or vomiting). 30 tablet 1  . psyllium (REGULOID) 0.52 G capsule Take 5 capsules by mouth daily.    . zolpidem (AMBIEN) 10 MG tablet Take 1 tablet (10 mg total) by mouth at bedtime as needed for sleep. 30 tablet 0   No current facility-administered medications for this visit.    PHYSICAL EXAMINATION: ECOG PERFORMANCE STATUS: 0 - Asymptomatic  Filed Vitals:   03/19/14 0855  BP: 121/66  Pulse: 82  Temp: 98.7 F (37.1 C)  Resp: 18   Filed Weights   03/19/14 0855  Weight: 190 lb 3.2 oz (86.274 kg)    GENERAL:alert, no distress and comfortable SKIN: skin color, texture, turgor are normal, no rashes or significant lesions EYES: normal, Conjunctiva are pink and non-injected, sclera clear OROPHARYNX:no exudate, no erythema and lips, buccal mucosa, and tongue normal  NECK: supple, thyroid normal size, non-tender, without nodularity LYMPH:  no palpable lymphadenopathy in the cervical, axillary or inguinal LUNGS: clear to auscultation and percussion with normal breathing effort HEART: regular rate & rhythm and no murmurs and no lower extremity edema ABDOMEN:abdomen soft, non-tender and normal bowel sounds Musculoskeletal:no cyanosis of digits and no clubbing  NEURO: alert & oriented x 3 with fluent speech, no focal motor/sensory deficits  LABORATORY DATA:  I have reviewed the data as listed    Component Value Date/Time   NA 138 03/19/2014 0840   NA 138 03/10/2014 0410   K 3.8 03/19/2014 0840   K 4.1 03/10/2014 0410   CL 105 03/10/2014 0410   CO2 25 03/19/2014 0840   CO2 24 03/10/2014 0410   GLUCOSE 162* 03/19/2014 0840   GLUCOSE 86 03/10/2014 0410   BUN 17.8 03/19/2014 0840   BUN 11 03/10/2014 0410   CREATININE 1.2 03/19/2014 0840   CREATININE 1.26 03/10/2014 0410   CALCIUM 9.3 03/19/2014 0840   CALCIUM 8.6 03/10/2014 0410   PROT 7.4  03/19/2014 0840   PROT 6.0 03/09/2014 0440   ALBUMIN 3.8 03/19/2014 0840   ALBUMIN 2.8* 03/09/2014 0440   AST 32 03/19/2014 0840   AST 50* 03/09/2014 0440   ALT 46 03/19/2014 0840   ALT 69* 03/09/2014 0440   ALKPHOS 95 03/19/2014 0840   ALKPHOS 73 03/09/2014 0440   BILITOT 0.88 03/19/2014 0840   BILITOT 0.2* 03/09/2014 0440   GFRNONAA 60* 03/10/2014 0410   GFRAA 69* 03/10/2014 0410    No results found for: SPEP, UPEP  Lab Results  Component Value Date   WBC 4.7 03/19/2014   NEUTROABS 2.9 03/19/2014   HGB 12.2* 03/19/2014   HCT 37.5* 03/19/2014   MCV 91.9 03/19/2014   PLT 351 03/19/2014      Chemistry      Component Value Date/Time   NA 138 03/19/2014 0840   NA 138 03/10/2014 0410   K 3.8 03/19/2014 0840   K 4.1 03/10/2014 0410   CL 105 03/10/2014 0410   CO2 25 03/19/2014 0840   CO2 24 03/10/2014 0410   BUN 17.8 03/19/2014 0840   BUN 11   03/10/2014 0410   CREATININE 1.2 03/19/2014 0840   CREATININE 1.26 03/10/2014 0410      Component Value Date/Time   CALCIUM 9.3 03/19/2014 0840   CALCIUM 8.6 03/10/2014 0410   ALKPHOS 95 03/19/2014 0840   ALKPHOS 73 03/09/2014 0440   AST 32 03/19/2014 0840   AST 50* 03/09/2014 0440   ALT 46 03/19/2014 0840   ALT 69* 03/09/2014 0440   BILITOT 0.88 03/19/2014 0840   BILITOT 0.2* 03/09/2014 0440      ASSESSMENT & PLAN:  Multiple myeloma He tolerated cycle 1 of treatment well apart from one episode of infection. I do not feel that the infection is truly related to the effects of treatment. I recommend we'll proceed with treatment today without dose adjustment. The plan would be for 4 courses of chemotherapy followed by repeat bone marrow biopsy. We will continue aggressive supportive care.  Amyloidosis Clinically, he does not appear to have amyloidosis involvement elsewhere. I would recommend sending his tissue block to [facility] for mass spectrometry for tissue subtype. 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 As mentioned above, combination therapy with Cytoxan, dexamethasone and Velcade is an approved regimen for amyloidosis. I recommend cardiology evaluation to exclude cardiac amyloidosis as this would impact decision for bone marrow transplant in the future. His appointment is pending.   Diabetes mellitus without complication His blood sugar was high for several days after treatment. I recommend he increased metformin to twice a day 3 days after treatment and to increase Lantus to 6 units for 3 days after treatment.  Lesion of mandible The patient would be candidate for IV bisphosphonate. We have obtained dental clearance to proceed. Clinically, he is not symptomatic    Orders Placed This Encounter  Procedures  . SPEP & IFE with QIG    Standing Status: Future     Number of Occurrences:      Standing Expiration Date: 04/23/2015  . Kappa/lambda light chains    Standing Status: Future     Number of Occurrences:      Standing Expiration Date: 04/23/2015  . Beta 2 microglobulin, serum    Standing Status: Future     Number of Occurrences:      Standing Expiration Date: 04/23/2015   All questions were answered. The patient knows to call the clinic with any problems, questions or concerns. No barriers to learning was detected. I spent 30 minutes counseling the patient face to face. The total time spent in the appointment was 40 minutes and more than 50% was on counseling and review of test results     GORSUCH, NI, MD 03/19/2014 10:11 PM    

## 2014-03-19 NOTE — Assessment & Plan Note (Signed)
He tolerated cycle 1 of treatment well apart from one episode of infection. I do not feel that the infection is truly related to the effects of treatment. I recommend we'll proceed with treatment today without dose adjustment. The plan would be for 4 courses of chemotherapy followed by repeat bone marrow biopsy. We will continue aggressive supportive care.

## 2014-03-19 NOTE — Telephone Encounter (Signed)
Per staff phone call and POF I have schedueld appts. Scheduler advised of appts.  JMW  

## 2014-03-19 NOTE — Assessment & Plan Note (Signed)
Clinically, he does not appear to have amyloidosis involvement elsewhere. I would recommend sending his tissue block to [facility] for mass spectrometry for tissue subtype. 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 As mentioned above, combination therapy with Cytoxan, dexamethasone and Velcade is an approved regimen for amyloidosis. I recommend cardiology evaluation to exclude cardiac amyloidosis as this would impact decision for bone marrow transplant in the future. His appointment is pending.

## 2014-03-19 NOTE — Telephone Encounter (Signed)
, °

## 2014-03-19 NOTE — Assessment & Plan Note (Signed)
The patient would be candidate for IV bisphosphonate. We have obtained dental clearance to proceed. Clinically, he is not symptomatic

## 2014-03-19 NOTE — Assessment & Plan Note (Signed)
His blood sugar was high for several days after treatment. I recommend he increased metformin to twice a day 3 days after treatment and to increase Lantus to 6 units for 3 days after treatment.

## 2014-03-19 NOTE — Progress Notes (Signed)
Per Dr. Alvy Bimler, she received the clearance for pt to proceed with Zometa from dentist.

## 2014-03-22 ENCOUNTER — Ambulatory Visit (HOSPITAL_BASED_OUTPATIENT_CLINIC_OR_DEPARTMENT_OTHER): Payer: BC Managed Care – PPO

## 2014-03-22 ENCOUNTER — Emergency Department (HOSPITAL_COMMUNITY): Payer: BC Managed Care – PPO

## 2014-03-22 ENCOUNTER — Inpatient Hospital Stay (HOSPITAL_COMMUNITY)
Admission: EM | Admit: 2014-03-22 | Discharge: 2014-03-26 | DRG: 871 | Disposition: A | Discharge status: Home / Self Care | Payer: BC Managed Care – PPO | Attending: Internal Medicine | Admitting: Internal Medicine

## 2014-03-22 ENCOUNTER — Encounter: Payer: Self-pay | Admitting: Nurse Practitioner

## 2014-03-22 ENCOUNTER — Encounter (HOSPITAL_COMMUNITY): Payer: Self-pay | Admitting: *Deleted

## 2014-03-22 ENCOUNTER — Telehealth: Payer: Self-pay | Admitting: *Deleted

## 2014-03-22 ENCOUNTER — Ambulatory Visit (HOSPITAL_BASED_OUTPATIENT_CLINIC_OR_DEPARTMENT_OTHER): Payer: BC Managed Care – PPO | Admitting: Nurse Practitioner

## 2014-03-22 VITALS — BP 142/65 | HR 95 | Temp 101.5°F | Resp 19 | Ht 72.0 in | Wt 196.6 lb

## 2014-03-22 DIAGNOSIS — C9000 Multiple myeloma not having achieved remission: Secondary | ICD-10-CM

## 2014-03-22 DIAGNOSIS — N179 Acute kidney failure, unspecified: Secondary | ICD-10-CM | POA: Diagnosis present

## 2014-03-22 DIAGNOSIS — R509 Fever, unspecified: Secondary | ICD-10-CM

## 2014-03-22 DIAGNOSIS — Z87891 Personal history of nicotine dependence: Secondary | ICD-10-CM

## 2014-03-22 DIAGNOSIS — C9001 Multiple myeloma in remission: Secondary | ICD-10-CM | POA: Diagnosis present

## 2014-03-22 DIAGNOSIS — J9601 Acute respiratory failure with hypoxia: Secondary | ICD-10-CM | POA: Diagnosis present

## 2014-03-22 DIAGNOSIS — E119 Type 2 diabetes mellitus without complications: Secondary | ICD-10-CM | POA: Diagnosis present

## 2014-03-22 DIAGNOSIS — A419 Sepsis, unspecified organism: Principal | ICD-10-CM | POA: Diagnosis present

## 2014-03-22 DIAGNOSIS — I959 Hypotension, unspecified: Secondary | ICD-10-CM

## 2014-03-22 DIAGNOSIS — Y95 Nosocomial condition: Secondary | ICD-10-CM | POA: Diagnosis present

## 2014-03-22 DIAGNOSIS — K219 Gastro-esophageal reflux disease without esophagitis: Secondary | ICD-10-CM | POA: Diagnosis present

## 2014-03-22 DIAGNOSIS — Z5112 Encounter for antineoplastic immunotherapy: Secondary | ICD-10-CM

## 2014-03-22 DIAGNOSIS — D6481 Anemia due to antineoplastic chemotherapy: Secondary | ICD-10-CM | POA: Diagnosis present

## 2014-03-22 DIAGNOSIS — IMO0001 Reserved for inherently not codable concepts without codable children: Secondary | ICD-10-CM | POA: Insufficient documentation

## 2014-03-22 DIAGNOSIS — E859 Amyloidosis, unspecified: Secondary | ICD-10-CM | POA: Diagnosis present

## 2014-03-22 DIAGNOSIS — R059 Cough, unspecified: Secondary | ICD-10-CM

## 2014-03-22 DIAGNOSIS — D801 Nonfamilial hypogammaglobulinemia: Secondary | ICD-10-CM | POA: Diagnosis present

## 2014-03-22 DIAGNOSIS — A047 Enterocolitis due to Clostridium difficile: Secondary | ICD-10-CM | POA: Diagnosis present

## 2014-03-22 DIAGNOSIS — D638 Anemia in other chronic diseases classified elsewhere: Secondary | ICD-10-CM | POA: Insufficient documentation

## 2014-03-22 DIAGNOSIS — I9589 Other hypotension: Secondary | ICD-10-CM

## 2014-03-22 DIAGNOSIS — J189 Pneumonia, unspecified organism: Secondary | ICD-10-CM | POA: Insufficient documentation

## 2014-03-22 DIAGNOSIS — R05 Cough: Secondary | ICD-10-CM

## 2014-03-22 LAB — COMPREHENSIVE METABOLIC PANEL
ALK PHOS: 93 U/L (ref 39–117)
ALT: 32 U/L (ref 0–53)
AST: 33 U/L (ref 0–37)
Albumin: 3.9 g/dL (ref 3.5–5.2)
Anion gap: 5 (ref 5–15)
BILIRUBIN TOTAL: 0.9 mg/dL (ref 0.3–1.2)
BUN: 12 mg/dL (ref 6–23)
CO2: 27 mmol/L (ref 19–32)
CREATININE: 1.2 mg/dL (ref 0.50–1.35)
Calcium: 8.5 mg/dL (ref 8.4–10.5)
Chloride: 102 mEq/L (ref 96–112)
GFR calc Af Amer: 74 mL/min — ABNORMAL LOW (ref >90)
GFR, EST NON AFRICAN AMERICAN: 64 mL/min — AB (ref >90)
GLUCOSE: 99 mg/dL (ref 70–99)
POTASSIUM: 3.6 mmol/L (ref 3.5–5.1)
Sodium: 134 mmol/L — ABNORMAL LOW (ref 135–145)
Total Protein: 7.2 g/dL (ref 6.0–8.3)

## 2014-03-22 LAB — CBC WITH DIFFERENTIAL/PLATELET
Basophils Absolute: 0 10*3/uL (ref 0.0–0.1)
Basophils Relative: 0 % (ref 0–1)
Eosinophils Absolute: 0.3 10*3/uL (ref 0.0–0.7)
Eosinophils Relative: 4 % (ref 0–5)
HCT: 35.8 % — ABNORMAL LOW (ref 39.0–52.0)
HEMOGLOBIN: 11.7 g/dL — AB (ref 13.0–17.0)
LYMPHS ABS: 0.7 10*3/uL (ref 0.7–4.0)
Lymphocytes Relative: 10 % — ABNORMAL LOW (ref 12–46)
MCH: 30.3 pg (ref 26.0–34.0)
MCHC: 32.7 g/dL (ref 30.0–36.0)
MCV: 92.7 fL (ref 78.0–100.0)
MONO ABS: 0.7 10*3/uL (ref 0.1–1.0)
MONOS PCT: 10 % (ref 3–12)
NEUTROS ABS: 5.3 10*3/uL (ref 1.7–7.7)
Neutrophils Relative %: 76 % (ref 43–77)
Platelets: 210 10*3/uL (ref 150–400)
RBC: 3.86 MIL/uL — AB (ref 4.22–5.81)
RDW: 15.2 % (ref 11.5–15.5)
WBC: 7.1 10*3/uL (ref 4.0–10.5)

## 2014-03-22 LAB — URINALYSIS, ROUTINE W REFLEX MICROSCOPIC
Bilirubin Urine: NEGATIVE
Glucose, UA: NEGATIVE mg/dL
Hgb urine dipstick: NEGATIVE
Ketones, ur: NEGATIVE mg/dL
Leukocytes, UA: NEGATIVE
Nitrite: NEGATIVE
Protein, ur: NEGATIVE mg/dL
Specific Gravity, Urine: 1.006 (ref 1.005–1.030)
Urobilinogen, UA: 0.2 mg/dL (ref 0.0–1.0)
pH: 5.5 (ref 5.0–8.0)

## 2014-03-22 LAB — I-STAT CG4 LACTIC ACID, ED: Lactic Acid, Venous: 1.62 mmol/L (ref 0.5–2.2)

## 2014-03-22 MED ORDER — VITAMIN D 1000 UNITS PO TABS
2000.0000 [IU] | ORAL_TABLET | Freq: Every morning | ORAL | Status: DC
  Administered 2014-03-23 – 2014-03-26 (×4): 2000 [IU] via ORAL
  Filled 2014-03-22 (×5): qty 2

## 2014-03-22 MED ORDER — SODIUM CHLORIDE 0.9 % IV BOLUS (SEPSIS)
1000.0000 mL | Freq: Once | INTRAVENOUS | Status: AC
  Administered 2014-03-22: 1000 mL via INTRAVENOUS

## 2014-03-22 MED ORDER — INSULIN GLARGINE 100 UNIT/ML ~~LOC~~ SOLN
5.0000 [IU] | Freq: Every day | SUBCUTANEOUS | Status: DC
  Administered 2014-03-22 – 2014-03-25 (×4): 5 [IU] via SUBCUTANEOUS
  Filled 2014-03-22 (×5): qty 0.05

## 2014-03-22 MED ORDER — FOLIC ACID 1 MG PO TABS
1.0000 mg | ORAL_TABLET | Freq: Every day | ORAL | Status: DC
  Administered 2014-03-23 – 2014-03-26 (×4): 1 mg via ORAL
  Filled 2014-03-22 (×4): qty 1

## 2014-03-22 MED ORDER — HEPARIN SODIUM (PORCINE) 5000 UNIT/ML IJ SOLN
5000.0000 [IU] | Freq: Three times a day (TID) | INTRAMUSCULAR | Status: DC
  Administered 2014-03-22 – 2014-03-25 (×8): 5000 [IU] via SUBCUTANEOUS
  Filled 2014-03-22 (×10): qty 1

## 2014-03-22 MED ORDER — METFORMIN HCL 500 MG PO TABS
500.0000 mg | ORAL_TABLET | Freq: Two times a day (BID) | ORAL | Status: DC
  Administered 2014-03-23 – 2014-03-26 (×7): 500 mg via ORAL
  Filled 2014-03-22 (×11): qty 1

## 2014-03-22 MED ORDER — SODIUM CHLORIDE 0.9 % IV SOLN
INTRAVENOUS | Status: DC
  Administered 2014-03-22 – 2014-03-24 (×3): via INTRAVENOUS

## 2014-03-22 MED ORDER — ACETAMINOPHEN 325 MG PO TABS
650.0000 mg | ORAL_TABLET | Freq: Four times a day (QID) | ORAL | Status: DC | PRN
  Administered 2014-03-22: 650 mg via ORAL
  Filled 2014-03-22: qty 2

## 2014-03-22 MED ORDER — CALCIUM CITRATE 950 (200 CA) MG PO TABS
200.0000 mg | ORAL_TABLET | Freq: Every day | ORAL | Status: DC
  Administered 2014-03-23 – 2014-03-26 (×4): 200 mg via ORAL
  Filled 2014-03-22 (×5): qty 1

## 2014-03-22 MED ORDER — ACYCLOVIR 400 MG PO TABS
400.0000 mg | ORAL_TABLET | Freq: Every day | ORAL | Status: DC
  Administered 2014-03-23 – 2014-03-26 (×4): 400 mg via ORAL
  Filled 2014-03-22 (×4): qty 1

## 2014-03-22 MED ORDER — PANTOPRAZOLE SODIUM 40 MG PO TBEC
40.0000 mg | DELAYED_RELEASE_TABLET | Freq: Every day | ORAL | Status: DC
  Administered 2014-03-23 – 2014-03-26 (×4): 40 mg via ORAL
  Filled 2014-03-22 (×4): qty 1

## 2014-03-22 MED ORDER — ONDANSETRON HCL 4 MG PO TABS: 4.0000 mg | ORAL_TABLET | Freq: Four times a day (QID) | ORAL | Status: DC | PRN

## 2014-03-22 MED ORDER — ACETAMINOPHEN 325 MG PO TABS
650.0000 mg | ORAL_TABLET | Freq: Four times a day (QID) | ORAL | Status: DC | PRN
  Administered 2014-03-22 – 2014-03-24 (×3): 650 mg via ORAL
  Filled 2014-03-22 (×3): qty 2

## 2014-03-22 MED ORDER — PIPERACILLIN-TAZOBACTAM 3.375 G IVPB
3.3750 g | Freq: Once | INTRAVENOUS | Status: AC
  Administered 2014-03-22: 3.375 g via INTRAVENOUS
  Filled 2014-03-22: qty 50

## 2014-03-22 MED ORDER — OXYCODONE HCL 5 MG PO TABS: 5.0000 mg | ORAL_TABLET | ORAL | Status: DC | PRN

## 2014-03-22 MED ORDER — SODIUM CHLORIDE 0.9 % IJ SOLN
3.0000 mL | Freq: Two times a day (BID) | INTRAMUSCULAR | Status: DC
  Administered 2014-03-23 – 2014-03-25 (×5): 3 mL via INTRAVENOUS

## 2014-03-22 MED ORDER — VANCOMYCIN HCL IN DEXTROSE 1-5 GM/200ML-% IV SOLN
1000.0000 mg | Freq: Two times a day (BID) | INTRAVENOUS | Status: DC
  Administered 2014-03-23 – 2014-03-25 (×6): 1000 mg via INTRAVENOUS
  Filled 2014-03-22 (×7): qty 200

## 2014-03-22 MED ORDER — ADULT MULTIVITAMIN W/MINERALS CH
1.0000 | ORAL_TABLET | Freq: Every day | ORAL | Status: DC
  Administered 2014-03-23 – 2014-03-26 (×4): 1 via ORAL
  Filled 2014-03-22 (×4): qty 1

## 2014-03-22 MED ORDER — VANCOMYCIN HCL IN DEXTROSE 1-5 GM/200ML-% IV SOLN
1000.0000 mg | Freq: Once | INTRAVENOUS | Status: AC
  Administered 2014-03-22: 1000 mg via INTRAVENOUS
  Filled 2014-03-22: qty 200

## 2014-03-22 MED ORDER — ACETAMINOPHEN 650 MG RE SUPP: 650.0000 mg | Freq: Four times a day (QID) | RECTAL | Status: DC | PRN

## 2014-03-22 MED ORDER — PIPERACILLIN-TAZOBACTAM 3.375 G IVPB
3.3750 g | Freq: Three times a day (TID) | INTRAVENOUS | Status: DC
  Administered 2014-03-23 – 2014-03-26 (×10): 3.375 g via INTRAVENOUS
  Filled 2014-03-22 (×10): qty 50

## 2014-03-22 MED ORDER — VITAMIN B-1 100 MG PO TABS
100.0000 mg | ORAL_TABLET | Freq: Every day | ORAL | Status: DC
  Administered 2014-03-23 – 2014-03-26 (×4): 100 mg via ORAL
  Filled 2014-03-22 (×4): qty 1

## 2014-03-22 MED ORDER — ASPIRIN 81 MG PO CHEW
81.0000 mg | CHEWABLE_TABLET | Freq: Every day | ORAL | Status: DC
  Administered 2014-03-23 – 2014-03-26 (×4): 81 mg via ORAL
  Filled 2014-03-22 (×4): qty 1

## 2014-03-22 MED ORDER — ONDANSETRON HCL 8 MG PO TABS
ORAL_TABLET | ORAL | Status: AC
  Filled 2014-03-22: qty 1

## 2014-03-22 MED ORDER — PRAVASTATIN SODIUM 40 MG PO TABS
40.0000 mg | ORAL_TABLET | Freq: Every day | ORAL | Status: DC
  Administered 2014-03-23 – 2014-03-25 (×2): 40 mg via ORAL
  Filled 2014-03-22 (×7): qty 1

## 2014-03-22 MED ORDER — MAGNESIUM OXIDE 400 (241.3 MG) MG PO TABS
400.0000 mg | ORAL_TABLET | Freq: Every day | ORAL | Status: DC
  Administered 2014-03-23 – 2014-03-26 (×4): 400 mg via ORAL
  Filled 2014-03-22 (×6): qty 1

## 2014-03-22 MED ORDER — BORTEZOMIB CHEMO SQ INJECTION 3.5 MG (2.5MG/ML)
1.3000 mg/m2 | Freq: Once | INTRAMUSCULAR | Status: AC
  Administered 2014-03-22: 2.75 mg via SUBCUTANEOUS
  Filled 2014-03-22: qty 2.75

## 2014-03-22 MED ORDER — INSULIN ASPART 100 UNIT/ML ~~LOC~~ SOLN
0.0000 [IU] | Freq: Three times a day (TID) | SUBCUTANEOUS | Status: DC
  Administered 2014-03-25: 2 [IU] via SUBCUTANEOUS

## 2014-03-22 MED ORDER — ONDANSETRON HCL 8 MG PO TABS
8.0000 mg | ORAL_TABLET | Freq: Once | ORAL | Status: AC
  Administered 2014-03-22: 8 mg via ORAL

## 2014-03-22 MED ORDER — ONDANSETRON HCL 4 MG/2ML IJ SOLN
4.0000 mg | Freq: Four times a day (QID) | INTRAMUSCULAR | Status: DC | PRN
  Administered 2014-03-23: 4 mg via INTRAVENOUS
  Filled 2014-03-22: qty 2

## 2014-03-22 NOTE — ED Notes (Signed)
Pt to xray

## 2014-03-22 NOTE — ED Notes (Addendum)
Pt from cancer center, currently being treated for multiple myeloma, received chemo this morning. Started having fever after chemo. Fever at home 102. In cancer center 101.5, pt took aleve at home. Denies pain.

## 2014-03-22 NOTE — Telephone Encounter (Signed)
   Provider input needed: Fever 100.4   Reason for call: Fever & sinus congestion  Ears, nose, mouth, throat, and face: positive for nasal congestion and sinus congestion Temp now 100.4   ALLERGIES:  has No Known Allergies.  Patient last received chemotherapy/ treatment on 03/22/14 (SQ Velcade + cycle #2 of CTX on 12/28)  Patient was last seen in the office on 03/19/14   Next appt is 03/26/14-lab/chemo  Is patient having fevers greater than 100.5?  no, 100.4 2 hours after taking OTC DayQuil   Is patient having uncontrolled pain, or new pain? no   Is patient having new back pain that changes with position (worsens or eases when laying down?)  no   Is patient able to eat and drink? yes    Is patient able to pass stool without difficulty?   yes     Is patient having uncontrolled nausea?  no    patient calls 03/22/2014 with complaint of sinus congestion and low grade fever. No dyspnea or sputum production at this time.    Summary Based on the above information advised patient to take another Tylenol (DayQuil only has 325 mg apap per dose) now and push fluids. Will check with MD for any further instructions   Tania Ade  03/22/2014, 3:51 PM   Background Info  Tyler Willis   DOB: 17-Sep-1950   MR#: [id number]   CSN#   [id number] 03/22/2014

## 2014-03-22 NOTE — ED Notes (Signed)
Dr. Khan at bedside

## 2014-03-22 NOTE — Patient Instructions (Signed)
[facility] Discharge Instructions for Patients Receiving Chemotherapy  Today you received the following chemotherapy agents velcade   To help prevent nausea and vomiting after your treatment, we encourage you to take your nausea medication as directed  If you develop nausea and vomiting that is not controlled by your nausea medication, call the clinic.   BELOW ARE SYMPTOMS THAT SHOULD BE REPORTED IMMEDIATELY:  *FEVER GREATER THAN 100.5 F  *CHILLS WITH OR WITHOUT FEVER  NAUSEA AND VOMITING THAT IS NOT CONTROLLED WITH YOUR NAUSEA MEDICATION  *UNUSUAL SHORTNESS OF BREATH  *UNUSUAL BRUISING OR BLEEDING  TENDERNESS IN MOUTH AND THROAT WITH OR WITHOUT PRESENCE OF ULCERS  *URINARY PROBLEMS  *BOWEL PROBLEMS  UNUSUAL RASH Items with * indicate a potential emergency and should be followed up as soon as possible.  Feel free to call the clinic you have any questions or concerns. The clinic phone number is [phone].  

## 2014-03-22 NOTE — ED Provider Notes (Signed)
CSN: [id number]     Arrival date & time 03/22/14  1754 History   First MD Initiated Contact with Patient 03/22/14 1801     Chief Complaint  Patient presents with  . chemo card   . Fever     (Consider location/radiation/quality/duration/timing/severity/associated sxs/prior Treatment) Patient is a 63 y.o. male presenting with fever. The history is provided by the patient.  Fever Associated symptoms: cough   Associated symptoms: no chest pain, no confusion, no diarrhea, no dysuria, no headaches, no rash, no sore throat and no vomiting   pt with hx multiple myeloma presents w fever 102 and non prod cough onset today.  Pt states had his 2nd round of chemo this morning. Last prior chemo was 3 weeks ago. Denies sore throat, runny nose, headaches or body aches. No chest pain or sob. No abd pain. No nvd. No dysuria or gu c/o. No rash. No extremity pain or swelling. No central line or port. Symptoms persistent since onset, pt unaware of exacerbating or alleviating factors.      Past Medical History  Diagnosis Date  . Diabetes mellitus without complication   . GERD (gastroesophageal reflux disease)   . 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 Multiple myeloma 02/19/2014   History reviewed. No pertinent past surgical history. Family History  Problem Relation Age of Onset  . Diabetes Father   . Cancer Maternal Uncle     lung cancer  . Cancer Cousin     myeloma   History  Substance Use Topics  . Smoking status: Former Smoker    Types: Pipe  . Smokeless tobacco: Never Used  . Alcohol Use: No    Review of Systems  Constitutional: Positive for fever.  HENT: Negative for sore throat.   Eyes: Negative for redness.  Respiratory: Positive for cough. Negative for shortness of breath.   Cardiovascular: Negative for chest pain.  Gastrointestinal: Negative for vomiting, abdominal pain and diarrhea.  Endocrine: Negative for polyuria.  Genitourinary: Negative for dysuria and flank pain.   Musculoskeletal: Negative for back pain and neck pain.  Skin: Negative for rash.  Neurological: Negative for headaches.  Hematological: Does not bruise/bleed easily.  Psychiatric/Behavioral: Negative for confusion.      Allergies  Review of patient's allergies indicates no known allergies.  Home Medications   Prior to Admission medications   Medication Sig Start Date End Date Taking? Authorizing Provider  acyclovir (ZOVIRAX) 400 MG tablet Take 1 tablet (400 mg total) by mouth daily. 02/22/14   Ni Gorsuch, MD  BAYER CONTOUR NEXT TEST test strip  11/29/13   Historical Provider, MD  cyclophosphamide (CYTOXAN) 50 MG tablet Take 6 tablets on days 1,8,15 of chemo 1hr before or 2hr after meals every 21d. 02/22/14   Ni Gorsuch, MD  dexamethasone (DECADRON) 4 MG tablet Take 10 pills every Monday with breakfast 02/22/14   Ni Gorsuch, MD  guaiFENesin-codeine 100-10 MG/5ML syrup Take 5 mLs by mouth every 6 (six) hours as needed for cough. 02/06/14   Yan Feng, MD  insulin glargine (LANTUS) 100 UNIT/ML injection Inject 5 Units into the skin at bedtime.    Historical Provider, MD  Magnesium 400 MG TABS Take 400 mg by mouth daily.    Historical Provider, MD  metFORMIN (GLUCOPHAGE) 500 MG tablet Take 500 mg by mouth 2 (two) times daily with a meal.    Historical Provider, MD  Multiple Vitamin (MULTIVITAMIN) tablet Take 1 tablet by mouth daily.    Historical Provider, MD  omeprazole (  PRILOSEC) 20 MG capsule Take 20 mg by mouth daily.    Historical Provider, MD  ondansetron (ZOFRAN) 8 MG tablet Take 1 tablet (8 mg total) by mouth every 8 (eight) hours as needed. 02/22/14   Heath Lark, MD  polyethylene glycol (MIRALAX / GLYCOLAX) packet Take 17 g by mouth daily.    Historical Provider, MD  pravastatin (PRAVACHOL) 40 MG tablet Take 40 mg by mouth daily. Daily 11/20/13   Historical Provider, MD  prochlorperazine (COMPAZINE) 10 MG tablet Take 1 tablet (10 mg total) by mouth every 6 (six) hours as needed (Nausea or  vomiting). 02/22/14   Heath Lark, MD  psyllium (REGULOID) 0.52 G capsule Take 5 capsules by mouth daily.    Historical Provider, MD   BP 116/58 mmHg  Pulse 91  Temp(Src) 100.5 F (38.1 C) (Oral)  Resp 16  SpO2 95% Physical Exam  Constitutional: He is oriented to person, place, and time. He appears well-developed and well-nourished. No distress.  HENT:  Mouth/Throat: Oropharynx is clear and moist.  Eyes: Conjunctivae are normal. No scleral icterus.  Neck: Neck supple. No tracheal deviation present.  No stiffness or rigidity  Cardiovascular: Normal rate, regular rhythm, normal heart sounds and intact distal pulses.  Exam reveals no gallop and no friction rub.   No murmur heard. Pulmonary/Chest: Effort normal and breath sounds normal. No accessory muscle usage. No respiratory distress.  Few rales left base  Abdominal: Soft. Bowel sounds are normal. He exhibits no distension and no mass. There is no tenderness. There is no rebound and no guarding.  Genitourinary:  No cva tenderness  Musculoskeletal: Normal range of motion. He exhibits no edema or tenderness.  Neurological: He is alert and oriented to person, place, and time.  Skin: Skin is warm and dry. No rash noted. He is not diaphoretic.  Psychiatric: He has a normal mood and affect.  Nursing note and vitals reviewed.   ED Course  Procedures (including critical care time) Labs Review  Results for orders placed or performed during the hospital encounter of 03/22/14  CBC WITH DIFFERENTIAL  Result Value Ref Range   WBC 7.1 4.0 - 10.5 K/uL   RBC 3.86 (L) 4.22 - 5.81 MIL/uL   Hemoglobin 11.7 (L) 13.0 - 17.0 g/dL   HCT 35.8 (L) 39.0 - 52.0 %   MCV 92.7 78.0 - 100.0 fL   MCH 30.3 26.0 - 34.0 pg   MCHC 32.7 30.0 - 36.0 g/dL   RDW 15.2 11.5 - 15.5 %   Platelets 210 150 - 400 K/uL   Neutrophils Relative % 76 43 - 77 %   Neutro Abs 5.3 1.7 - 7.7 K/uL   Lymphocytes Relative 10 (L) 12 - 46 %   Lymphs Abs 0.7 0.7 - 4.0 K/uL    Monocytes Relative 10 3 - 12 %   Monocytes Absolute 0.7 0.1 - 1.0 K/uL   Eosinophils Relative 4 0 - 5 %   Eosinophils Absolute 0.3 0.0 - 0.7 K/uL   Basophils Relative 0 0 - 1 %   Basophils Absolute 0.0 0.0 - 0.1 K/uL  Comprehensive metabolic panel  Result Value Ref Range   Sodium 134 (L) 135 - 145 mmol/L   Potassium 3.6 3.5 - 5.1 mmol/L   Chloride 102 96 - 112 mEq/L   CO2 27 19 - 32 mmol/L   Glucose, Bld 99 70 - 99 mg/dL   BUN 12 6 - 23 mg/dL   Creatinine, Ser 1.20 0.50 - 1.35 mg/dL  Calcium 8.5 8.4 - 10.5 mg/dL   Total Protein 7.2 6.0 - 8.3 g/dL   Albumin 3.9 3.5 - 5.2 g/dL   AST 33 0 - 37 U/L   ALT 32 0 - 53 U/L   Alkaline Phosphatase 93 39 - 117 U/L   Total Bilirubin 0.9 0.3 - 1.2 mg/dL   GFR calc non Af Amer 64 (L) >90 mL/min   GFR calc Af Amer 74 (L) >90 mL/min   Anion gap 5 5 - 15  Urinalysis with microscopic  Result Value Ref Range   Color, Urine YELLOW YELLOW   APPearance CLEAR CLEAR   Specific Gravity, Urine 1.006 1.005 - 1.030   pH 5.5 5.0 - 8.0   Glucose, UA NEGATIVE NEGATIVE mg/dL   Hgb urine dipstick NEGATIVE NEGATIVE   Bilirubin Urine NEGATIVE NEGATIVE   Ketones, ur NEGATIVE NEGATIVE mg/dL   Protein, ur NEGATIVE NEGATIVE mg/dL   Urobilinogen, UA 0.2 0.0 - 1.0 mg/dL   Nitrite NEGATIVE NEGATIVE   Leukocytes, UA NEGATIVE NEGATIVE  I-Stat CG4 Lactic Acid, ED  Result Value Ref Range   Lactic Acid, Venous 1.62 0.5 - 2.2 mmol/L   Dg Chest 2 View  03/22/2014   CLINICAL DATA:  Fever, current history of multiple myeloma.  EXAM: CHEST  2 VIEW  COMPARISON:  March 10, 2014.  FINDINGS: The heart size and mediastinal contours are within normal limits. Both lungs are clear. No pneumothorax or pleural effusion is noted. The visualized skeletal structures are unremarkable.  IMPRESSION: No acute cardiopulmonary abnormality seen.   Electronically Signed   By: Chayne  Green M.D.   On: 03/22/2014 18:57   Dg Chest 2 View  03/10/2014   CLINICAL DATA:  Fever and cough after  chemotherapy; multiple myeloma  EXAM: CHEST  2 VIEW  COMPARISON:  March 08, 2014  FINDINGS: There is patchy consolidation in the left base with small left effusion. Elsewhere lungs are clear. Heart size and pulmonary vascularity are within normal limits. No adenopathy. Changes of multiple myeloma are again noted, primarily in the clavicles.  IMPRESSION: Patchy consolidation left base. Small left effusion. Lungs otherwise clear. Scattered bony changes consistent with known multiple myeloma.   Electronically Signed   By: William  Woodruff M.D.   On: 03/10/2014 08:09   Dg Chest Port 1 View  03/08/2014   CLINICAL DATA:  Multiple myeloma.  Fever.  EXAM: PORTABLE CHEST - 1 VIEW  COMPARISON:  Most recent chest x-ray comparison 11/29/2007  FINDINGS: Normal heart size. Stable mild aortic tortuosity. There is minimal atelectasis or scarring at the left base. There is no edema, consolidation, effusion, or pneumothorax. Myelomatous changes, best visualized in the bilateral clavicles.  IMPRESSION: Negative for pneumonia.   Electronically Signed   By: Jonathan  Watts M.D.   On: 03/08/2014 22:20       MDM   Iv ns bolus. Labs. Cx. Cxr.  Reviewed nursing notes and prior charts for additional history.   cxr neg for pna, however pt w cough, congestion/rales on exam.   On recheck bp low 88/42.  Reviewed admit from 12/15, similar to current, no pna on initial cxr.    Given hypotension, will admit for obs, ivf, iv abx.   hospitalist paged for admission.     Kevin E Steinl, MD 03/22/14 2034 

## 2014-03-22 NOTE — ED Notes (Signed)
Patient given a sandwich and a diet coke.

## 2014-03-22 NOTE — H&P (Signed)
[facility] History and Physical  Tyler Willis QAS:[id number] DOB: Aug 24, 1950 DOA: 03/22/2014  Referring physician: Lajean Saver, MD PCP: Gennette Pac, MD   Chief Complaint: Fever  HPI: Tyler Willis is a 63 y.o. male presents with fever cough and chills. Patient states he has been sick since about 2 days. Patient staets the fever developed today. He was admitted 2 weeks ago with possible pneumonia. Patient states he has no chest pain. He has noted no shortness of breath. He states no headaches. He states no edema noted. Patient has felt weak overall. He was seen in the cancer clinic today and was told to come to the ED. He had his chemo treatment this morning apparently. Patient was noted to have low pressures and was given fluids. This seems to have helped a little.   Review of Systems:  Constitutional:  No weight loss, night sweats, ++Fevers, ++chills, ++fatigue.  HEENT:  No headaches, Difficulty swallowing,Tooth/dental problems,++Sore throat,  ++sneezing  Cardio-vascular:  No chest pain, Orthopnea, PND, swelling in lower extremities  GI:  No heartburn, indigestion, abdominal pain, nausea, vomiting, diarrhea  Resp:  No shortness of breath with exertion or at rest. No excess mucus, no productive cough, No coughing up of blood  Skin:  no rash or lesions GU:  no dysuria, change in color of urine, no urgency or frequency.  Musculoskeletal:  No joint pain or swelling. No decreased range of motion.  Psych:  No change in mood or affect. No depression or anxiety. No memory loss.   Past Medical History  Diagnosis Date  . Diabetes mellitus without complication   . GERD (gastroesophageal reflux disease)   . 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 Multiple myeloma 02/19/2014   History reviewed. No pertinent past surgical history. Social History:  reports that he has quit smoking. His smoking use included Pipe. He has never used smokeless tobacco. He reports that he does not  drink alcohol or use illicit drugs.  No Known Allergies  Family History  Problem Relation Age of Onset  . Diabetes Father   . Cancer Maternal Uncle     lung cancer  . Cancer Cousin     myeloma     Prior to Admission medications   Medication Sig Start Date End Date Taking? Authorizing Provider  acyclovir (ZOVIRAX) 400 MG tablet Take 1 tablet (400 mg total) by mouth daily. 02/22/14  Yes Heath Lark, MD  aspirin 81 MG chewable tablet Chew 81 mg by mouth daily.   Yes Historical Provider, MD  BAYER CONTOUR NEXT TEST test strip  11/29/13  Yes Historical Provider, MD  calcium citrate (CALCITRATE - DOSED IN MG ELEMENTAL CALCIUM) 950 MG tablet Take 200 mg of elemental calcium by mouth daily.   Yes Historical Provider, MD  Cholecalciferol (VITAMIN D3) 2000 UNITS TABS Take by mouth.   Yes Historical Provider, MD  cyclophosphamide (CYTOXAN) 50 MG tablet Take 6 tablets on days 1,8,15 of chemo 1hr before or 2hr after meals every 21d. 02/22/14  Yes Heath Lark, MD  dexamethasone (DECADRON) 4 MG tablet Take 10 pills every Monday with breakfast 02/22/14  Yes Ni Gorsuch, MD  guaiFENesin-codeine 100-10 MG/5ML syrup Take 5 mLs by mouth every 6 (six) hours as needed for cough. 02/06/14  Yes Truitt Merle, MD  insulin glargine (LANTUS) 100 UNIT/ML injection Inject 5 Units into the skin at bedtime.   Yes Historical Provider, MD  Magnesium 400 MG TABS Take 400 mg by mouth daily.   Yes Historical  Provider, MD  metFORMIN (GLUCOPHAGE) 500 MG tablet Take 500 mg by mouth 2 (two) times daily with a meal.   Yes Historical Provider, MD  Multiple Vitamin (MULTIVITAMIN) tablet Take 1 tablet by mouth daily.   Yes Historical Provider, MD  naproxen sodium (ANAPROX) 220 MG tablet Take 220 mg by mouth once.   Yes Historical Provider, MD  omeprazole (PRILOSEC) 20 MG capsule Take 20 mg by mouth daily.   Yes Historical Provider, MD  polyethylene glycol (MIRALAX / GLYCOLAX) packet Take 17 g by mouth daily.   Yes Historical Provider, MD    pravastatin (PRAVACHOL) 40 MG tablet Take 40 mg by mouth daily. Daily 11/20/13  Yes Historical Provider, MD  Pseudoephedrine-APAP-DM (DAYQUIL PO) Take by mouth.   Yes Historical Provider, MD  ondansetron (ZOFRAN) 8 MG tablet Take 1 tablet (8 mg total) by mouth every 8 (eight) hours as needed. Patient not taking: Reported on 03/22/2014 02/22/14   Heath Lark, MD  prochlorperazine (COMPAZINE) 10 MG tablet Take 1 tablet (10 mg total) by mouth every 6 (six) hours as needed (Nausea or vomiting). Patient not taking: Reported on 03/22/2014 02/22/14   Heath Lark, MD  psyllium (REGULOID) 0.52 G capsule Take 5 capsules by mouth daily.    Historical Provider, MD   Physical Exam: Filed Vitals:   03/22/14 1756 03/22/14 1938 03/22/14 2000  BP: 116/58 89/42 99/50   Pulse: 91 84 86  Temp: 100.5 F (38.1 C) 98.6 F (37 C)   TempSrc: Oral Oral   Resp: 16 16 16   SpO2: 95% 93% 94%    Wt Readings from Last 3 Encounters:  03/22/14 89.177 kg (196 lb 9.6 oz)  03/19/14 86.274 kg (190 lb 3.2 oz)  03/11/14 92.8 kg (204 lb 9.4 oz)    General:  Appears calm and comfortable Eyes: PERRL, normal lids, irises & conjunctiva ENT: grossly normal hearing, lips & tongue Neck: no LAD, masses or thyromegaly Cardiovascular: RRR, no m/r/g. No LE edema. Respiratory: CTA bilaterally, no w/r/r. Normal respiratory effort. Abdomen: soft, ntnd Skin: no rash or induration seen on limited exam Musculoskeletal: grossly normal tone BUE/BLE Psychiatric: grossly normal mood and affect, speech fluent and appropriate Neurologic: grossly non-focal.          Labs on Admission:  Basic Metabolic Panel:  Recent Labs Lab 03/19/14 0840 03/22/14 1809  NA 138 134*  K 3.8 3.6  CL  --  102  CO2 25 27  GLUCOSE 162* 99  BUN 17.8 12  CREATININE 1.2 1.20  CALCIUM 9.3 8.5   Liver Function Tests:  Recent Labs Lab 03/19/14 0840 03/22/14 1809  AST 32 33  ALT 46 32  ALKPHOS 95 93  BILITOT 0.88 0.9  PROT 7.4 7.2  ALBUMIN 3.8  3.9   No results for input(s): LIPASE, AMYLASE in the last 168 hours. No results for input(s): AMMONIA in the last 168 hours. CBC:  Recent Labs Lab 03/19/14 0840 03/22/14 1809  WBC 4.7 7.1  NEUTROABS 2.9 5.3  HGB 12.2* 11.7*  HCT 37.5* 35.8*  MCV 91.9 92.7  PLT 351 210   Cardiac Enzymes: No results for input(s): CKTOTAL, CKMB, CKMBINDEX, TROPONINI in the last 168 hours.  BNP (last 3 results) No results for input(s): PROBNP in the last 8760 hours. CBG: No results for input(s): GLUCAP in the last 168 hours.  Radiological Exams on Admission: Dg Chest 2 View  03/22/2014   CLINICAL DATA:  Fever, current history of multiple myeloma.  EXAM: CHEST  2 VIEW  COMPARISON:  March 10, 2014.  FINDINGS: The heart size and mediastinal contours are within normal limits. Both lungs are clear. No pneumothorax or pleural effusion is noted. The visualized skeletal structures are unremarkable.  IMPRESSION: No acute cardiopulmonary abnormality seen.   Electronically Signed   By: Sabino Dick M.D.   On: 03/22/2014 18:57     Assessment/Plan   1. Hypotension -possible sepsis -will give IVF Ns for hydration -monitor pressures -will give pressors as needed  2. Fever -cultures will be done -will start on Vanc and zosyn  3. Multiple Myeloma -received chemo today -oncology follow up  4. Diabetes Mellitus type 2 -will continue with insulin -will monitor FSBS -continue with oral agents    Code Status: Full Code (must indicate code status--if unknown or must be presumed, indicate so) DVT Prophylaxis:Heparin Family Communication: wife (indicate person spoken with, if applicable, with phone number if by telephone) Disposition Plan: Home (indicate anticipated LOS)  Time spent: 24mn  Jakobee Brackins A [facility] Pager [phone]

## 2014-03-22 NOTE — Progress Notes (Signed)
will   SYMPTOM MANAGEMENT CLINIC   HPI: Tyler Willis 63 y.o. male diagnosed with multiple myeloma. Currently undergoing Cybord chemotherapy regimen which consist of oral Cytoxan and  Velcade subcutaneous injections.   Patient called the cancer Center this afternoon with complaint of fever to maximum 102. He is also complaining of a one-week history of significant nasal congestion and mild cough. Patient states he took Aleve approximate one hour ago with minimal effectiveness. Patient states that he was discharged from the hospital following a diagnosis of pneumonia and sepsis on 03/11/2014.Marland Kitchen He completed a course of Levaquin on 03/16/2014.   Patient was actually at the [facility] earlier this morning to receive cycle 2, day 4 of his Velcade injection. He states at that time he had no fever whatsoever.  Patient denies any GI symptoms or UTI symptoms whatsoever.  HPI  CURRENT THERAPY: Upcoming Treatment Dates - MYELOMA INDUCTION TRANSPLANT CANDIDATE CyBorD SQ q21d Days with orders from any treatment category:  03/26/2014      SCHEDULING COMMUNICATION      ondansetron (ZOFRAN) tablet 8 mg      bortezomib SQ (VELCADE) chemo injection 2.75 mg      TREATMENT CONDITIONS      [facility] NURSING COMMUNICATION 03/29/2014      SCHEDULING COMMUNICATION      ondansetron (ZOFRAN) tablet 8 mg      bortezomib SQ (VELCADE) chemo injection 2.75 mg      TREATMENT CONDITIONS 04/09/2014      SCHEDULING COMMUNICATION      ondansetron (ZOFRAN) tablet 8 mg      bortezomib SQ (VELCADE) chemo injection 2.75 mg      TREATMENT CONDITIONS      [facility]SING COMMUNICATION    ROS  Past Medical History  Diagnosis Date  . Diabetes mellitus without complication   . GERD (gastroesophageal reflux disease)   . 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 Multiple myeloma 02/19/2014    History reviewed. No pertinent past surgical history.  has Diverticulosis; Lytic bone lesions on xray; Multiple myeloma; Amyloidosis;  Coronary artery calcification seen on CAT scan; Diabetes mellitus without complication; Seminoma of right testis; Lesion of mandible; Bleeding hemorrhoid; Insomnia; HLD (hyperlipidemia); GERD (gastroesophageal reflux disease); and Fever on his problem list.     has No Known Allergies.    Medication List       This list is accurate as of: 03/22/14  5:58 PM.  Always use your most recent med list.               acyclovir 400 MG tablet  Commonly known as:  ZOVIRAX  Take 1 tablet (400 mg total) by mouth daily.     BAYER CONTOUR NEXT TEST test strip  Generic drug:  glucose blood     calcium citrate 950 MG tablet  Commonly known as:  CALCITRATE - dosed in mg elemental calcium  Take 200 mg of elemental calcium by mouth daily.     cyclophosphamide 50 MG tablet  Commonly known as:  CYTOXAN  Take 6 tablets on days 1,8,15 of chemo 1hr before or 2hr after meals every 21d.     dexamethasone 4 MG tablet  Commonly known as:  DECADRON  Take 10 pills every Monday with breakfast     guaiFENesin-codeine 100-10 MG/5ML syrup  Take 5 mLs by mouth every 6 (six) hours as needed for cough.     insulin glargine 100 UNIT/ML injection  Commonly known as:  LANTUS  Inject 5  Units into the skin at bedtime.     Magnesium 400 MG Tabs  Take 400 mg by mouth daily.     metFORMIN 500 MG tablet  Commonly known as:  GLUCOPHAGE  Take 500 mg by mouth 2 (two) times daily with a meal.     multivitamin tablet  Take 1 tablet by mouth daily.     omeprazole 20 MG capsule  Commonly known as:  PRILOSEC  Take 20 mg by mouth daily.     ondansetron 8 MG tablet  Commonly known as:  ZOFRAN  Take 1 tablet (8 mg total) by mouth every 8 (eight) hours as needed.     polyethylene glycol packet  Commonly known as:  MIRALAX / GLYCOLAX  Take 17 g by mouth daily.     pravastatin 40 MG tablet  Commonly known as:  PRAVACHOL  Take 40 mg by mouth daily. Daily     prochlorperazine 10 MG tablet  Commonly known as:   COMPAZINE  Take 1 tablet (10 mg total) by mouth every 6 (six) hours as needed (Nausea or vomiting).     psyllium 0.52 G capsule  Commonly known as:  REGULOID  Take 5 capsules by mouth daily.         PHYSICAL EXAMINATION  Blood pressure 142/65, pulse 95, temperature 101.5 F (38.6 C), temperature source Oral, resp. rate 19, height 6' (1.829 m), weight 196 lb 9.6 oz (89.177 kg), SpO2 96 %.  Physical Exam  Constitutional: He is oriented to person, place, and time. He appears unhealthy.  HENT:  Head: Normocephalic and atraumatic.  Mouth/Throat: No oropharyngeal exudate.  Patient noted to have a significant nasal congestion on exam; but no tenderness with palpation.  Eyes: Conjunctivae and EOM are normal. Pupils are equal, round, and reactive to light. Right eye exhibits no discharge. Left eye exhibits no discharge. No scleral icterus.  Neck: Normal range of motion. Neck supple. No JVD present. No tracheal deviation present. No thyromegaly present.  Cardiovascular: Normal rate, regular rhythm, normal heart sounds and intact distal pulses.   Pulmonary/Chest: Effort normal and breath sounds normal. No stridor. No respiratory distress. He has no wheezes. He has no rales. He exhibits no tenderness.  Patient had a slightly congested cough on exam; but was in no acute respiratory.  Abdominal: Soft. Bowel sounds are normal. He exhibits no distension and no mass. There is no tenderness. There is no rebound and no guarding.  Musculoskeletal: Normal range of motion. He exhibits no edema or tenderness.  Lymphadenopathy:    He has no cervical adenopathy.  Neurological: He is alert and oriented to person, place, and time. Gait normal.  Skin: Skin is warm and dry. No rash noted. No erythema. There is pallor.  Psychiatric: Affect normal.  Nursing note and vitals reviewed.   LABORATORY DATA:. Admission on 03/22/2014  Component Date Value Ref Range Status  . WBC 03/22/2014 7.1  4.0 - 10.5 K/uL Final   . RBC 03/22/2014 3.86* 4.22 - 5.81 MIL/uL Final  . Hemoglobin 03/22/2014 11.7* 13.0 - 17.0 g/dL Final  . HCT 03/22/2014 35.8* 39.0 - 52.0 % Final  . MCV 03/22/2014 92.7  78.0 - 100.0 fL Final  . MCH 03/22/2014 30.3  26.0 - 34.0 pg Final  . MCHC 03/22/2014 32.7  30.0 - 36.0 g/dL Final  . RDW 03/22/2014 15.2  11.5 - 15.5 % Final  . Platelets 03/22/2014 210  150 - 400 K/uL Final  . Neutrophils Relative % 03/22/2014 76  43 -  77 % Final  . Neutro Abs 03/22/2014 5.3  1.7 - 7.7 K/uL Final  . Lymphocytes Relative 03/22/2014 10* 12 - 46 % Final  . Lymphs Abs 03/22/2014 0.7  0.7 - 4.0 K/uL Final  . Monocytes Relative 03/22/2014 10  3 - 12 % Final  . Monocytes Absolute 03/22/2014 0.7  0.1 - 1.0 K/uL Final  . Eosinophils Relative 03/22/2014 4  0 - 5 % Final  . Eosinophils Absolute 03/22/2014 0.3  0.0 - 0.7 K/uL Final  . Basophils Relative 03/22/2014 0  0 - 1 % Final  . Basophils Absolute 03/22/2014 0.0  0.0 - 0.1 K/uL Final  . Sodium 03/22/2014 134* 135 - 145 mmol/L Final   Please note change in reference range.  . Potassium 03/22/2014 3.6  3.5 - 5.1 mmol/L Final   Please note change in reference range.  . Chloride 03/22/2014 102  96 - 112 mEq/L Final  . CO2 03/22/2014 27  19 - 32 mmol/L Final  . Glucose, Bld 03/22/2014 99  70 - 99 mg/dL Final  . BUN 03/22/2014 12  6 - 23 mg/dL Final  . Creatinine, Ser 03/22/2014 1.20  0.50 - 1.35 mg/dL Final  . Calcium 03/22/2014 8.5  8.4 - 10.5 mg/dL Final  . Total Protein 03/22/2014 7.2  6.0 - 8.3 g/dL Final  . Albumin 03/22/2014 3.9  3.5 - 5.2 g/dL Final  . AST 03/22/2014 33  0 - 37 U/L Final  . ALT 03/22/2014 32  0 - 53 U/L Final  . Alkaline Phosphatase 03/22/2014 93  39 - 117 U/L Final  . Total Bilirubin 03/22/2014 0.9  0.3 - 1.2 mg/dL Final  . GFR calc non Af Amer 03/22/2014 64* >90 mL/min Final  . GFR calc Af Amer 03/22/2014 74* >90 mL/min Final   Comment: (NOTE) The eGFR has been calculated using the CKD EPI equation. This calculation has not  been validated in all clinical situations. eGFR's persistently <90 mL/min signify possible Chronic Kidney Disease.   . Anion gap 03/22/2014 5  5 - 15 Final  . Color, Urine 03/22/2014 YELLOW  YELLOW Final  . APPearance 03/22/2014 CLEAR  CLEAR Final  . Specific Gravity, Urine 03/22/2014 1.006  1.005 - 1.030 Final  . pH 03/22/2014 5.5  5.0 - 8.0 Final  . Glucose, UA 03/22/2014 NEGATIVE  NEGATIVE mg/dL Final  . Hgb urine dipstick 03/22/2014 NEGATIVE  NEGATIVE Final  . Bilirubin Urine 03/22/2014 NEGATIVE  NEGATIVE Final  . Ketones, ur 03/22/2014 NEGATIVE  NEGATIVE mg/dL Final  . Protein, ur 03/22/2014 NEGATIVE  NEGATIVE mg/dL Final  . Urobilinogen, UA 03/22/2014 0.2  0.0 - 1.0 mg/dL Final  . Nitrite 03/22/2014 NEGATIVE  NEGATIVE Final  . Leukocytes, UA 03/22/2014 NEGATIVE  NEGATIVE Final   MICROSCOPIC NOT DONE ON URINES WITH NEGATIVE PROTEIN, BLOOD, LEUKOCYTES, NITRITE, OR GLUCOSE <1000 mg/dL.  . Lactic Acid, Venous 03/22/2014 1.62  0.5 - 2.2 mmol/L Final     RADIOGRAPHIC STUDIES: Dg Chest 2 View  03/22/2014   CLINICAL DATA:  Fever, current history of multiple myeloma.  EXAM: CHEST  2 VIEW  COMPARISON:  March 10, 2014.  FINDINGS: The heart size and mediastinal contours are within normal limits. Both lungs are clear. No pneumothorax or pleural effusion is noted. The visualized skeletal structures are unremarkable.  IMPRESSION: No acute cardiopulmonary abnormality seen.   Electronically Signed   By: Sabino Dick M.D.   On: 03/22/2014 18:57    ASSESSMENT/PLAN:    Fever Pt reports onset of fever  to max of 102 this afternoon.  Took Aleve approx 1 hour prior to arrival to Cancer center. C/o URI symptoms all week; with main c/o sinus congestion and drainage only x 1 week. Pt was recently discharged from hospital with dx of pneumonia and sepsis. Completed 7 day course of Levaquin on 03/16/14.    On exam- pt with significant nasal congestion and congested cough.  No wheeze or acute respiratory  distress on exam.  Pt appears slightly weak.  Temp at [facility] was 101.5.    Due to late hour of day; and recurrent fever following recent pneumonia and sepsis dx- pt to be transported to ED for further eval. Brief report and hx called to ED charge [place]. Pt transported to ED via wheelchair per RN.    Also called [place] on call MD Dr. Marin Olp to give brief report as well.   Multiple myeloma Pt currently undergoing Cybord chemotherapy (oral cytoxan and velcade SQ injections).  Received cycle 2, day 4 of his velcade injection earlier this morning.  Is scheduled to receive cycle 2, day 8 o fthis same regimen on Monday 03/26/14.    Patient stated understanding of all instructions; and was in agreement with this plan of care. The patient knows to call the clinic with any problems, questions or concerns.   Review/collaboration with Dr. Marin Olp [facility] on call MD) regarding all aspects of patient's visit today.   Total time spent with patient was 25 minutes;  with greater than 80 percent of that time spent in face to face counseling regarding his symptoms, and coordination of care and follow up.  Disclaimer: This note was dictated with voice recognition software. Similar sounding words can inadvertently be transcribed and may not be corrected upon review.   Drue Second, NP 03/22/2014

## 2014-03-22 NOTE — Telephone Encounter (Signed)
Called wife back after speaking with Selena Lesser, NP : Bring him to office now to be seen to determine if he just needs outpatient antibiotic or if we need to expedite a trip to the ER. She can have him here in 20 minutes. Adds that his temp is now 101.2. Instructed her to get him here now.

## 2014-03-22 NOTE — Assessment & Plan Note (Addendum)
Pt reports onset of fever to max of 102 this afternoon.  Took Aleve approx 1 hour prior to arrival to Cancer center. C/o URI symptoms all week; with main c/o sinus congestion and drainage only x 1 week. Pt was recently discharged from hospital with dx of pneumonia and sepsis. Completed 7 day course of Levaquin on 03/16/14.    On exam- pt with significant nasal congestion and congested cough.  No wheeze or acute respiratory distress on exam.  Pt appears slightly weak.  Temp at [facility] was 101.5.    Due to late hour of day; and recurrent fever following recent pneumonia and sepsis dx- pt to be transported to ED for further eval. Brief report and hx called to ED charge [place]. Pt transported to ED via wheelchair per RN.    Also called [place] on call MD Dr. Marin Olp to give brief report as well.

## 2014-03-22 NOTE — Assessment & Plan Note (Signed)
Pt currently undergoing Cybord chemotherapy (oral cytoxan and velcade SQ injections).  Received cycle 2, day 4 of his velcade injection earlier this morning.  Is scheduled to receive cycle 2, day 8 o fthis same regimen on Monday 03/26/14.

## 2014-03-22 NOTE — ED Notes (Signed)
Bed: WA11 Expected date:  Expected time:  Means of arrival:  Comments: Cancer center 

## 2014-03-22 NOTE — Progress Notes (Signed)
ANTIBIOTIC CONSULT NOTE - INITIAL  Pharmacy Consult for Zosyn/Vancomycin Indication: Febrile neutropenia/Sepsis  No Known Allergies  Patient Measurements: Height: 6' (182.9 cm) Weight: 195 lb 5.2 oz (88.6 kg) IBW/kg (Calculated) : 77.6   Vital Signs: Temp: 102.5 F (39.2 C) (12/31 2300) Temp Source: Oral (12/31 2300) BP: 120/58 mmHg (12/31 2300) Pulse Rate: 84 (12/31 2300) Intake/Output from previous day:   Intake/Output from this shift: Total I/O In: -  Out: 300 [Urine:300]  Labs:  Recent Labs  03/22/14 1809  WBC 7.1  HGB 11.7*  PLT 210  CREATININE 1.20   Estimated Creatinine Clearance: 71 mL/min (by C-G formula based on Cr of 1.2). No results for input(s): VANCOTROUGH, VANCOPEAK, VANCORANDOM, GENTTROUGH, GENTPEAK, GENTRANDOM, TOBRATROUGH, TOBRAPEAK, TOBRARND, AMIKACINPEAK, AMIKACINTROU, AMIKACIN in the last 72 hours.   Microbiology: Recent Results (from the past 720 hour(s))  Blood Culture (routine x 2)     Status: None   Collection Time: 03/08/14 10:06 PM  Result Value Ref Range Status   Specimen Description BLOOD LEFT HAND  Final   Special Requests BOTTLES DRAWN AEROBIC AND ANAEROBIC 10ML  Final   Culture  Setup Time   Final    03/09/2014 04:41 Performed at [facility]   Final    NO GROWTH 5 DAYS Performed at [facility]    Report Status 03/15/2014 FINAL  Final  Blood Culture (routine x 2)     Status: None   Collection Time: 03/08/14 10:22 PM  Result Value Ref Range Status   Specimen Description BLOOD RIGHT ARM  Final   Special Requests BOTTLES DRAWN AEROBIC AND ANAEROBIC 5CC  Final   Culture  Setup Time   Final    03/09/2014 04:40 Performed at [facility]   Final    NO GROWTH 5 DAYS Performed at [facility]    Report Status 03/15/2014 FINAL  Final  Urine culture     Status: None   Collection Time: 03/08/14 11:36 PM  Result Value Ref Range Status   Specimen Description URINE, CLEAN  CATCH  Final   Special Requests NONE  Final   Culture  Setup Time   Final    03/09/2014 05:07 Performed at [place] Performed at [facility]   Final   Culture NO GROWTH Performed at [facility]   Final   Report Status 03/10/2014 FINAL  Final    Medical History: Past Medical History  Diagnosis Date  . Diabetes mellitus without complication   . GERD (gastroesophageal reflux disease)   . 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 Multiple myeloma 02/19/2014    Medications:  Scheduled:  . [START ON 03/23/2014] acyclovir  400 mg Oral Daily  . [START ON 03/23/2014] aspirin  81 mg Oral Daily  . [START ON 03/23/2014] calcium citrate  200 mg of elemental calcium Oral Daily  . [START ON 03/23/2014] cholecalciferol  2,000 Units Oral q morning - 10a  . [START ON 10/27/5641] folic acid  1 mg Oral Daily  . heparin  5,000 Units Subcutaneous 3 times per day  . [START ON 03/23/2014] insulin aspart  0-15 Units Subcutaneous TID WC  . insulin glargine  5 Units Subcutaneous QHS  . [START ON 03/23/2014] magnesium oxide  400 mg Oral Daily  . [START ON 03/23/2014] metFORMIN  500 mg Oral BID WC  . [START ON 03/23/2014] multivitamin with minerals  1 tablet  Oral Daily  . [START ON 03/23/2014] pantoprazole  40 mg Oral Daily  . [START ON 03/23/2014] piperacillin-tazobactam (ZOSYN)  IV  3.375 g Intravenous Q8H  . pravastatin  40 mg Oral Daily  . sodium chloride  3 mL Intravenous Q12H  . [START ON 03/23/2014] thiamine  100 mg Oral Daily  . [START ON 03/23/2014] vancomycin  1,000 mg Intravenous Q12H   Infusions:  . sodium chloride 75 mL/hr at 03/22/14 2303   Assessment: 45 yoM with hx multiple myeloma now with T=102.  Zosyn and Vancomycin per Rx.  Goal of Therapy:  Vancomycin trough level 15-20 mcg/ml  Plan:   Zosyn 3.375 gm IV q8h  EI  Vancomycin 1Gm IV q12h  F/u SCr/levels/cultures as needed  Lawana Pai R 03/22/2014,11:12 PM

## 2014-03-23 ENCOUNTER — Encounter (HOSPITAL_COMMUNITY): Payer: Self-pay | Admitting: *Deleted

## 2014-03-23 DIAGNOSIS — E119 Type 2 diabetes mellitus without complications: Secondary | ICD-10-CM | POA: Diagnosis present

## 2014-03-23 DIAGNOSIS — A047 Enterocolitis due to Clostridium difficile: Secondary | ICD-10-CM | POA: Diagnosis present

## 2014-03-23 DIAGNOSIS — R509 Fever, unspecified: Secondary | ICD-10-CM | POA: Diagnosis present

## 2014-03-23 DIAGNOSIS — D630 Anemia in neoplastic disease: Secondary | ICD-10-CM

## 2014-03-23 DIAGNOSIS — Y95 Nosocomial condition: Secondary | ICD-10-CM | POA: Diagnosis present

## 2014-03-23 DIAGNOSIS — J189 Pneumonia, unspecified organism: Secondary | ICD-10-CM

## 2014-03-23 DIAGNOSIS — D6481 Anemia due to antineoplastic chemotherapy: Secondary | ICD-10-CM

## 2014-03-23 DIAGNOSIS — C9000 Multiple myeloma not having achieved remission: Secondary | ICD-10-CM | POA: Diagnosis present

## 2014-03-23 DIAGNOSIS — IMO0001 Reserved for inherently not codable concepts without codable children: Secondary | ICD-10-CM | POA: Insufficient documentation

## 2014-03-23 DIAGNOSIS — A419 Sepsis, unspecified organism: Secondary | ICD-10-CM | POA: Diagnosis present

## 2014-03-23 DIAGNOSIS — N179 Acute kidney failure, unspecified: Secondary | ICD-10-CM | POA: Diagnosis present

## 2014-03-23 DIAGNOSIS — D638 Anemia in other chronic diseases classified elsewhere: Secondary | ICD-10-CM | POA: Insufficient documentation

## 2014-03-23 DIAGNOSIS — D801 Nonfamilial hypogammaglobulinemia: Secondary | ICD-10-CM

## 2014-03-23 DIAGNOSIS — K219 Gastro-esophageal reflux disease without esophagitis: Secondary | ICD-10-CM | POA: Diagnosis present

## 2014-03-23 DIAGNOSIS — E859 Amyloidosis, unspecified: Secondary | ICD-10-CM | POA: Diagnosis present

## 2014-03-23 DIAGNOSIS — Z87891 Personal history of nicotine dependence: Secondary | ICD-10-CM | POA: Diagnosis not present

## 2014-03-23 DIAGNOSIS — N178 Other acute kidney failure: Secondary | ICD-10-CM

## 2014-03-23 DIAGNOSIS — J9601 Acute respiratory failure with hypoxia: Secondary | ICD-10-CM | POA: Diagnosis present

## 2014-03-23 LAB — COMPREHENSIVE METABOLIC PANEL
ALT: 41 U/L (ref 0–53)
AST: 38 U/L — ABNORMAL HIGH (ref 0–37)
Albumin: 3.6 g/dL (ref 3.5–5.2)
Alkaline Phosphatase: 87 U/L (ref 39–117)
Anion gap: 4 — ABNORMAL LOW (ref 5–15)
BUN: 13 mg/dL (ref 6–23)
CALCIUM: 8.3 mg/dL — AB (ref 8.4–10.5)
CHLORIDE: 101 meq/L (ref 96–112)
CO2: 29 mmol/L (ref 19–32)
Creatinine, Ser: 1.42 mg/dL — ABNORMAL HIGH (ref 0.50–1.35)
GFR calc Af Amer: 60 mL/min — ABNORMAL LOW (ref >90)
GFR calc non Af Amer: 52 mL/min — ABNORMAL LOW (ref >90)
Glucose, Bld: 100 mg/dL — ABNORMAL HIGH (ref 70–99)
POTASSIUM: 4 mmol/L (ref 3.5–5.1)
Sodium: 134 mmol/L — ABNORMAL LOW (ref 135–145)
Total Bilirubin: 0.5 mg/dL (ref 0.3–1.2)
Total Protein: 6.7 g/dL (ref 6.0–8.3)

## 2014-03-23 LAB — CBC
HCT: 34 % — ABNORMAL LOW (ref 39.0–52.0)
HEMOGLOBIN: 11 g/dL — AB (ref 13.0–17.0)
MCH: 30.4 pg (ref 26.0–34.0)
MCHC: 32.4 g/dL (ref 30.0–36.0)
MCV: 93.9 fL (ref 78.0–100.0)
PLATELETS: 206 10*3/uL (ref 150–400)
RBC: 3.62 MIL/uL — ABNORMAL LOW (ref 4.22–5.81)
RDW: 15.4 % (ref 11.5–15.5)
WBC: 6 10*3/uL (ref 4.0–10.5)

## 2014-03-23 LAB — GLUCOSE, CAPILLARY
GLUCOSE-CAPILLARY: 89 mg/dL (ref 70–99)
GLUCOSE-CAPILLARY: 131 mg/dL — AB (ref 70–99)
Glucose-Capillary: 82 mg/dL (ref 70–99)
Glucose-Capillary: 82 mg/dL (ref 70–99)
Glucose-Capillary: 110 mg/dL — ABNORMAL HIGH (ref 70–99)

## 2014-03-23 LAB — TSH: TSH: 3.039 u[IU]/mL (ref 0.350–4.500)

## 2014-03-23 LAB — MRSA PCR SCREENING: MRSA by PCR: NEGATIVE

## 2014-03-23 MED ORDER — IPRATROPIUM-ALBUTEROL 0.5-2.5 (3) MG/3ML IN SOLN: 3.0000 mL | RESPIRATORY_TRACT | Status: DC | PRN

## 2014-03-23 NOTE — Progress Notes (Addendum)
Patient ID: Tyler Willis, male   DOB: 07/11/1950, 63 y.o.   MRN: [id number] [facility] PROGRESS NOTE  Tyler Willis MRN:[id number] DOB: 11/26/1950 DOA: 03/22/2014 PCP: LITTLE,KEVIN LORNE, MD  Brief narrative:    63 y.o. male with past medical history of multiple myeloma, amyloidosis, receiving chemotherapy who presented to [facility] ED with complaints of fever, cough for past 2 days prior to this admission. Patient was recently hospitalized for treatment of pneumonia. On admission, blood pressure was 89/42, heart 395, respiratory rate 22, T max 102.5 F and oxygen saturation 92% on nasal cannula oxygen support. Blood work revealed hemoglobin of 11.7 otherwise unremarkable. Chest x-ray showed no acute cardiopulmonary findings. Patient was admitted to stepdown unit for treatment of possible sepsis. He was started on broad-spectrum antibiotics, vancomycin and Zosyn.   Assessment/Plan:     Principal problem: Sepsis secondary to possible HCAP  Sepsis criteria met on the admission with vital signs that included hypotension, tachycardia, tachypnea, fever and hypoxia. Suspected source of infection - pneumonia.  Patient was started on broad-spectrum antibiotics on the admission, vancomycin and Zosyn.  Follow-up blood culture results.  We will keep in step down unit for next 24 hours due to soft blood pressure. Current blood pressure is 98/45.  Respiratory status is stable.  Active Problems: Acute hypoxic respiratory failure  Likely secondary to healthcare associated pneumonia. Respiratory status is stable at this time.  May use duoneb as needed for shortness of breath or wheezing.  Multiple myeloma  With concurrent amyloidosis at the same time.   Patient is s/p C2 D1 chemo with dexamethasone 20 mg weekly, Cytoxan 300 mg weekly and Velcade.  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  Appreciate oncology seen the patient in consultation. Treatment is on hold because of  fever and possible infection.  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  Hemoglobin is 11. No current indications for transfusion.  Diabetes mellitus without complication  Continue Lantus 5 units at bedtime along with sliding scale insulin.  Acute renal failure  Likely prerenal versus due to vancomycin.  Continue IV fluids.  Follow-up BMP in the morning.   DVT Prophylaxis   Heparin subcutaneous ordered  Code Status: Full.  Family Communication:  plan of care discussed with the patient Disposition Plan: Remains in step down unit.  IV access:  Peripheral IV  Procedures and diagnostic studies:    Dg Chest 2 View 03/22/2014    No acute cardiopulmonary abnormality seen.    Medical Consultants:  Oncology, Dr. Ni Gorsuch  Other Consultants:  None   IAnti-Infectives:   Vancomycin 03/22/2014 --> Zosyn 03/22/2014 -->   DEVINE, ALMA, MD  [facility] Pager [phone]  If 7PM-7AM, please contact night-coverage [email or web address] Password [email or web address] 03/23/2014, 8:42 AM   LOS: 1 day    HPI/Subjective: No acute overnight events.  Objective: Filed Vitals:   03/23/14 0400 03/23/14 0500 03/23/14 0600 03/23/14 0700  BP: 118/59 160/79 115/72 98/45  Pulse: 80 78 84 82  Temp:      TempSrc:      Resp: 21 22 19 22  Height:      Weight:      SpO2: 93% 93% 92% 88%    Intake/Output Summary (Last 24 hours) at 03/23/14 0842 Last data filed at 03/23/14 0748  Gross per 24 hour  Intake 1126.25 ml  Output   1625 ml  Net -  498.75 ml    Exam:   General:  Pt is alert, follows commands appropriately, not in acute distress  Cardiovascular: Regular rate and rhythm, S1/S2 (+)  Respiratory: no wheezing, no crackles, no rhonchi  Abdomen: Soft, non tender, non distended, bowel sounds present  Extremities: No edema, pulses DP and PT palpable  bilaterally  Neuro: Grossly nonfocal  Data Reviewed: Basic Metabolic Panel:  Recent Labs Lab 03/19/14 0840 03/22/14 1809 03/23/14 0340  NA 138 134* 134*  K 3.8 3.6 4.0  CL  --  102 101  CO2 _0 GLUCOSE 162* 99 100*  BUN 17._1 CREATININE 1.2 1.20 1.42*  CALCIUM 9.3 8.5 8.3*   Liver Function Tests:  Recent Labs Lab 03/19/14 0840 03/22/14 1809 03/23/14 0340  AST 32 33 38*  ALT 46 32 41  ALKPHOS 95 93 87  BILITOT 0.88 0.9 0.5  PROT 7.4 7.2 6.7  ALBUMIN 3.8 3.9 3.6   No results for input(s): LIPASE, AMYLASE in the last 168 hours. No results for input(s): AMMONIA in the last 168 hours. CBC:  Recent Labs Lab 03/19/14 0840 03/22/14 1809 03/23/14 0340  WBC 4.7 7.1 6.0  NEUTROABS 2.9 5.3  --   HGB 12.2* 11.7* 11.0*  HCT 37.5* 35.8* 34.0*  MCV 91.9 92.7 93.9  PLT 351 210 206   Cardiac Enzymes: No results for input(s): CKTOTAL, CKMB, CKMBINDEX, TROPONINI in the last 168 hours. BNP: Invalid input(s): POCBNP CBG:  Recent Labs Lab 03/22/14 2311 03/23/14 0720  GLUCAP 89 82    Recent Results (from the past 240 hour(s))  MRSA PCR Screening     Status: None   Collection Time: 03/22/14 10:59 PM  Result Value Ref Range Status   MRSA by PCR NEGATIVE NEGATIVE Final     Scheduled Meds: . acyclovir  400 mg Oral Daily  . aspirin  81 mg Oral Daily  . calcium citrate  200 mg of elemental  Oral Daily  . cholecalciferol  2,000 Units Oral q morning - 34V  . folic acid  1 mg Oral Daily  . heparin  5,000 Units Subcutaneous 3 times per day  . insulin aspart  0-15 Units Subcutaneous TID WC  . insulin glargine  5 Units Subcutaneous QHS  . magnesium oxide  400 mg Oral Daily  . metFORMIN  500 mg Oral BID WC  . multivitamin   1 tablet Oral Daily  . pantoprazole  40 mg Oral Daily  . piperacillin-tazobactam  3.375 g Intravenous Q8H  . pravastatin  40 mg Oral Daily  . thiamine  100 mg Oral Daily  . vancomycin  1,000 mg Intravenous Q12H   Continuous  Infusions: . sodium chloride 75 mL/hr at 03/22/14 2303

## 2014-03-23 NOTE — Progress Notes (Signed)
UR completed 

## 2014-03-23 NOTE — Consult Note (Signed)
[facility] CONSULT NOTE  Patient Care Team: Hulan Fess, MD as PCP - General (Family Medicine) Heath Lark, MD as Consulting Physician (Hematology and Oncology)  CHIEF COMPLAINTS/PURPOSE OF CONSULTATION:  The patient was admitted to the hospital for fever and chills, on background history of multiple myeloma and amyloidosis, with ongoing chemotherapy  HISTORY OF PRESENTING ILLNESS:  Tyler Willis 64 y.o. male is here because of fever and chills. He has extensive background history of cancer and is currently undergoing chemotherapy in the outpatient. Oncology History   ISS Staging II, M-spike 1.7, IgG 2420, kappa 30.1, beta46mcroglobulin 4.48, Creatinine 1.2, calcium 9.8, hemoglobin 13.2, albumin 4.2     Multiple myeloma   01/10/2003 Pathology Results [phone] pathology showed pure seminoma with lymphovascular invasion, pT2 NX MX   01/10/2003 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 congo red staing reveals amyloid deposition within some of the amorphous material and within blood vessels. Normal cytogenetics.   02/14/2014 Imaging PET CT scan showed diffuse skeletal involvement   02/26/2014 -  Chemotherapy He is started on chemotherapy with Velcade, Cytoxan and dexamethasone.   When he was last seen in the outpatient clinic, he was feeling well. Yesterday, he contacted the cancer center and was seen by my nurse practitioner at the symptom management clinic for fevers and chills. Due to recent diagnosis of pneumonia and  sepsis, he was admitted to the stepdown unit for aggressive management with broad-spectrum intravenous antibiotics. This morning, he complains of persistent fever and chills. He denies any nasal drainage, sore throat, or cough. Denies any dysuria, frequency, urgency or diarrhea. He has no mouth sores or mucositis.  MEDICAL HISTORY:  Past Medical History  Diagnosis Date  . Diabetes mellitus without complication   . GERD (gastroesophageal reflux disease)   . 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 Multiple myeloma 02/19/2014    SURGICAL HISTORY: History reviewed. No pertinent past surgical history.  SOCIAL HISTORY: History   Social History  . Marital Status: Married    Spouse Name: N/A    Number of Children: N/A  . Years of Education: N/A   Occupational History  . Not on file.   Social History Main Topics  . Smoking status: Former Smoker    Types: Pipe  . Smokeless tobacco: Never Used  . Alcohol Use: No  . Drug Use: No  . Sexual Activity: Not on file   Other Topics Concern  . Not on file   Social History Narrative    FAMILY HISTORY: Family History  Problem Relation Age of Onset  . Diabetes Father   . Cancer Maternal Uncle     lung cancer  . Cancer Cousin     myeloma    ALLERGIES:  has No Known Allergies.  MEDICATIONS:  Current Facility-Administered Medications  Medication Dose Route Frequency Provider Last Rate Last Dose  . 0.9 %  sodium chloride infusion   Intravenous Continuous SAllyne Gee MD 75 mL/hr at 03/22/14 2303    . acetaminophen (TYLENOL) tablet 650 mg  650 mg Oral Q6H PRN SAllyne Gee MD  650 mg at 03/22/14 2318   Or  . acetaminophen (TYLENOL) suppository 650 mg  650 mg Rectal Q6H PRN Allyne Gee, MD      . acyclovir (ZOVIRAX) tablet 400 mg  400 mg Oral Daily Allyne Gee, MD      . aspirin chewable tablet 81 mg  81 mg Oral Daily Allyne Gee, MD      . calcium citrate (CALCITRATE - dosed in mg elemental calcium) tablet 200 mg of elemental  calcium  200 mg of elemental calcium Oral Daily Allyne Gee, MD      . cholecalciferol (VITAMIN D) tablet 2,000 Units  2,000 Units Oral q morning - 10a Allyne Gee, MD      . folic acid (FOLVITE) tablet 1 mg  1 mg Oral Daily Allyne Gee, MD      . heparin injection 5,000 Units  5,000 Units Subcutaneous 3 times per day Allyne Gee, MD   5,000 Units at 03/23/14 0500  . insulin aspart (novoLOG) injection 0-15 Units  0-15 Units Subcutaneous TID WC Allyne Gee, MD      . insulin glargine (LANTUS) injection 5 Units  5 Units Subcutaneous QHS Allyne Gee, MD   5 Units at 03/22/14 2318  . magnesium oxide (MAG-OX) tablet 400 mg  400 mg Oral Daily Allyne Gee, MD      . metFORMIN (GLUCOPHAGE) tablet 500 mg  500 mg Oral BID WC Allyne Gee, MD      . multivitamin with minerals tablet 1 tablet  1 tablet Oral Daily Allyne Gee, MD      . ondansetron [facility]) tablet 4 mg  4 mg Oral Q6H PRN Allyne Gee, MD       Or  . ondansetron (ZOFRAN) injection 4 mg  4 mg Intravenous Q6H PRN Allyne Gee, MD      . oxyCODONE (Oxy IR/ROXICODONE) immediate release tablet 5 mg  5 mg Oral Q4H PRN Allyne Gee, MD      . pantoprazole (PROTONIX) EC tablet 40 mg  40 mg Oral Daily Allyne Gee, MD      . piperacillin-tazobactam (ZOSYN) IVPB 3.375 g  3.375 g Intravenous Q8H Dorrene German, RPH   3.375 g at 03/23/14 0500  . pravastatin (PRAVACHOL) tablet 40 mg  40 mg Oral Daily Allyne Gee, MD      . sodium chloride 0.9 % injection 3 mL  3 mL Intravenous Q12H Allyne Gee, MD   3 mL at 03/22/14 2300  . thiamine (VITAMIN B-1) tablet 100 mg  100 mg Oral Daily Allyne Gee, MD      . vancomycin (VANCOCIN) IVPB 1000 mg/200 mL premix  1,000 mg Intravenous Q12H Dorrene German, RPH        REVIEW OF SYSTEMS:   Eyes: Denies blurriness of vision, double vision or watery eyes Ears, nose, mouth, throat, and face: Denies mucositis or sore throat Respiratory: Denies cough, dyspnea or wheezes Cardiovascular:  Denies palpitation, chest discomfort or lower extremity swelling Gastrointestinal:  Denies nausea, heartburn or change in bowel habits Skin: Denies abnormal skin rashes Lymphatics: Denies new lymphadenopathy or easy bruising Neurological:Denies numbness, tingling or new weaknesses Behavioral/Psych: Mood is stable, no new changes  All other systems were reviewed with the patient and are negative.  PHYSICAL EXAMINATION: ECOG PERFORMANCE STATUS: 1 - Symptomatic but completely ambulatory  Filed Vitals:   03/23/14 0700  BP: 98/45  Pulse: 82  Temp:   Resp: 22   Filed Weights   03/22/14 2300  Weight: 195 lb 5.2 oz (88.6 kg)    GENERAL:alert, no distress and comfortable SKIN: skin color, texture, turgor are normal, no rashes or significant lesions EYES: normal, conjunctiva are pink and non-injected, sclera clear OROPHARYNX:no exudate, no erythema and lips, buccal mucosa, and tongue normal  NECK: supple, thyroid normal size, non-tender, without nodularity LYMPH:  no palpable lymphadenopathy in the cervical, axillary or inguinal LUNGS: clear to auscultation and percussion with normal breathing effort HEART: regular rate & rhythm and no murmurs and no lower extremity edema ABDOMEN:abdomen soft, non-tender and normal bowel sounds Musculoskeletal:no cyanosis of digits and no clubbing  PSYCH: alert & oriented x 3 with fluent speech NEURO: no focal motor/sensory deficits  LABORATORY DATA:  I have reviewed the data as listed Lab Results  Component Value Date   WBC 6.0 03/23/2014   HGB 11.0* 03/23/2014   HCT 34.0* 03/23/2014   MCV 93.9 03/23/2014   PLT 206 03/23/2014    Recent Labs  03/10/14 0410 03/19/14 0840 03/22/14 1809 03/23/14 0340  NA 138 138 134* 134*  K 4.1 3.8 3.6 4.0  CL 105  --  102 101  CO2 24 25 27 29   GLUCOSE 86 162* 99 100*  BUN 11 17.8 12 13   CREATININE 1.26 1.2 1.20 1.42*  CALCIUM 8.6 9.3 8.5 8.3*  GFRNONAA 60*  --  64* 52*  GFRAA 69*  --  74* 60*  PROT   --  7.4 7.2 6.7  ALBUMIN  --  3.8 3.9 3.6  AST  --  32 33 38*  ALT  --  46 32 41  ALKPHOS  --  95 93 87  BILITOT  --  0.88 0.9 0.5    RADIOGRAPHIC STUDIES: I have personally reviewed the radiological images as listed and agreed with the findings in the report. Dg Chest 2 View  03/22/2014   CLINICAL DATA:  Fever, current history of multiple myeloma.  EXAM: CHEST  2 VIEW  COMPARISON:  March 10, 2014.  FINDINGS: The heart size and mediastinal contours are within normal limits. Both lungs are clear. No pneumothorax or pleural effusion is noted. The visualized skeletal structures are unremarkable.  IMPRESSION: No acute cardiopulmonary abnormality seen.   Electronically Signed   By: Sabino Dick M.D.   On: 03/22/2014 18:57   Dg Chest 2 View  03/10/2014   CLINICAL DATA:  Fever and cough after chemotherapy; multiple myeloma  EXAM: CHEST  2 VIEW  COMPARISON:  March 08, 2014  FINDINGS: There is patchy consolidation in the left base with small left effusion. Elsewhere lungs are clear. Heart size and pulmonary vascularity are within normal limits. No adenopathy. Changes of multiple myeloma are again noted, primarily in the clavicles.  IMPRESSION: Patchy consolidation left base. Small left effusion. Lungs otherwise clear. Scattered bony changes consistent with known multiple myeloma.   Electronically Signed   By: Lowella Grip M.D.   On: 03/10/2014 08:09   Dg Chest Port 1 View  03/08/2014   CLINICAL DATA:  Multiple myeloma.  Fever.  EXAM: PORTABLE CHEST - 1 VIEW  COMPARISON:  Most recent chest x-ray comparison 11/29/2007  FINDINGS: Normal heart size. Stable mild aortic tortuosity. There is minimal atelectasis or scarring at the left base. There is no edema, consolidation, effusion, or pneumothorax. Myelomatous changes, best visualized in the bilateral clavicles.  IMPRESSION: Negative for pneumonia.   Electronically Signed   By: Gilford Silvius.D.  On: 03/08/2014 22:20    ASSESSMENT & PLAN:   Multiple myeloma Complex situation due to concurrent diagnosis of multiple myeloma and amyloidosis at the same time. Currently he is s/p C2 D1 chemo with dexamethasone 20 mg weekly, Cytoxan 300 mg weekly and Velcade on days 1, 4, 7 and 11. His treatment is placed on hold for now until his fever and chills resolved.  Amyloidosis Cardiology evaluation is scheduled for January to exclude cardiac amyloidosis  Again, hold treatment as above  Fever In the setting of recent chemotherapy and possible infectious illness, rule out bacterial versus viral. Cultures are pending. Chest X ray and Urinalysis are negative No neutropenia noted to date. Continue IV antibiotics with Vanco and Cefepime, antivirals, IV fluids, antipyretics.  Monitor counts closely. If his fever does not improve, there may be a role for IVIG.  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 If his fever does not resolve, there will be a role for IVIG treatment.  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 Continue to monitor for now.  No transfusion needed unless Hb drops to 7 or bleeding issues occur.   Diabetes mellitus without complication On Lantus. Sliding scale insulin   Prerenal acute renal failure There is mildly elevated creatinine noted, likely related to infection. Continue aggressive IV fluid resuscitation  Mild hypotension This could be related to early sepsis. Continue IV fluid resuscitation  DVT prophylaxis On Heparin subq   Full Code  Discharge planning If afebrile in the next 48 hours, he can be DC with oral antibiotics. I will return to see him tomorrow.  All questions were answered. The patient knows to call the clinic with any problems, questions or concerns.    [place], [place], MD 03/23/2014 7:35  AM

## 2014-03-24 LAB — GLUCOSE, CAPILLARY
GLUCOSE-CAPILLARY: 118 mg/dL — AB (ref 70–99)
GLUCOSE-CAPILLARY: 78 mg/dL (ref 70–99)
Glucose-Capillary: 96 mg/dL (ref 70–99)
Glucose-Capillary: 90 mg/dL (ref 70–99)
Glucose-Capillary: 143 mg/dL — ABNORMAL HIGH (ref 70–99)

## 2014-03-24 LAB — BASIC METABOLIC PANEL
ANION GAP: 3 — AB (ref 5–15)
BUN: 11 mg/dL (ref 6–23)
CALCIUM: 7.8 mg/dL — AB (ref 8.4–10.5)
CO2: 28 mmol/L (ref 19–32)
CREATININE: 1.31 mg/dL (ref 0.50–1.35)
Chloride: 102 mEq/L (ref 96–112)
GFR calc Af Amer: 66 mL/min — ABNORMAL LOW (ref >90)
GFR calc non Af Amer: 57 mL/min — ABNORMAL LOW (ref >90)
Glucose, Bld: 97 mg/dL (ref 70–99)
Potassium: 4.1 mmol/L (ref 3.5–5.1)
SODIUM: 133 mmol/L — AB (ref 135–145)

## 2014-03-24 LAB — CBC
HCT: 33.2 % — ABNORMAL LOW (ref 39.0–52.0)
Hemoglobin: 10.9 g/dL — ABNORMAL LOW (ref 13.0–17.0)
MCH: 30.4 pg (ref 26.0–34.0)
MCHC: 32.8 g/dL (ref 30.0–36.0)
MCV: 92.5 fL (ref 78.0–100.0)
Platelets: 166 10*3/uL (ref 150–400)
RBC: 3.59 MIL/uL — AB (ref 4.22–5.81)
RDW: 15.5 % (ref 11.5–15.5)
WBC: 5.1 10*3/uL (ref 4.0–10.5)

## 2014-03-24 MED ORDER — MENTHOL 3 MG MT LOZG
1.0000 | LOZENGE | OROMUCOSAL | Status: DC | PRN
  Administered 2014-03-24: 3 mg via ORAL
  Filled 2014-03-24: qty 9

## 2014-03-24 MED ORDER — PHENOL 1.4 % MT LIQD
1.0000 | OROMUCOSAL | Status: DC | PRN
  Filled 2014-03-24: qty 177

## 2014-03-24 MED ORDER — BENZONATATE 100 MG PO CAPS
100.0000 mg | ORAL_CAPSULE | Freq: Three times a day (TID) | ORAL | Status: DC | PRN
  Administered 2014-03-24 (×2): 100 mg via ORAL
  Filled 2014-03-24 (×3): qty 1

## 2014-03-24 NOTE — Progress Notes (Signed)
Tyler Willis   DOB:02/24/1951   IO#:[id number]    Subjective: He feels better. Denies further fevers or chills since 4:00 last night. Denies any cough or shortness of breath. Heart rate has stabilized.  Objective:  Filed Vitals:   03/24/14 1200  BP: 127/74  Pulse: 74  Temp: 97.2 F (36.2 C)  Resp: 14     Intake/Output Summary (Last 24 hours) at 03/24/14 1312 Last data filed at 03/24/14 1200  Gross per 24 hour  Intake   2755 ml  Output   6352 ml  Net  -3597 ml    GENERAL:alert, no distress and comfortable SKIN: skin color, texture, turgor are normal, no rashes or significant lesions EYES: normal, Conjunctiva are pink and non-injected, sclera clear OROPHARYNX:no exudate, no erythema and lips, buccal mucosa, and tongue normal  NECK: supple, thyroid normal size, non-tender, without nodularity LYMPH:  no palpable lymphadenopathy in the cervical, axillary or inguinal LUNGS: clear to auscultation and percussion with normal breathing effort HEART: regular rate & rhythm and no murmurs and no lower extremity edema ABDOMEN:abdomen soft, non-tender and normal bowel sounds Musculoskeletal:no cyanosis of digits and no clubbing  NEURO: alert & oriented x 3 with fluent speech, no focal motor/sensory deficits   Labs:  Lab Results  Component Value Date   WBC 5.1 03/24/2014   HGB 10.9* 03/24/2014   HCT 33.2* 03/24/2014   MCV 92.5 03/24/2014   PLT 166 03/24/2014   NEUTROABS 5.3 03/22/2014    Lab Results  Component Value Date   NA 133* 03/24/2014   K 4.1 03/24/2014   CL 102 03/24/2014   CO2 28 03/24/2014    Studies:  Dg Chest 2 View  03/22/2014   CLINICAL DATA:  Fever, current history of multiple myeloma.  EXAM: CHEST  2 VIEW  COMPARISON:  March 10, 2014.  FINDINGS: The heart size and mediastinal contours are within normal limits. Both lungs are clear. No pneumothorax or pleural effusion is noted. The visualized skeletal structures are unremarkable.  IMPRESSION: No acute  cardiopulmonary abnormality seen.   Electronically Signed   By: Sabino Dick M.D.   On: 03/22/2014 18:57    Assessment & Plan:  Multiple myeloma Complex situation due to concurrent diagnosis of multiple myeloma and amyloidosis at the same time. Currently he is s/p C2 D1 chemo with dexamethasone 20 mg weekly, Cytoxan 300 mg weekly and Velcade on days 1, 4, 7 and 11. His treatment is placed on hold for now until his fever and chills resolved.  Amyloidosis Cardiology evaluation is scheduled for January to exclude cardiac amyloidosis  Again, hold treatment as above  Fever, resolved In the setting of recent chemotherapy and possible infectious illness, rule out bacterial versus viral. Cultures are pending. Chest X ray and Urinalysis are negative No neutropenia noted to date. Continue IV antibiotics with Vanco and Cefepime, antivirals, IV fluids, antipyretics.  Monitor counts closely. If his fever does not improve, there may be a role for IVIG.  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 If his fever does not resolve, there will be a role for IVIG treatment.  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 Continue to monitor for now.  No transfusion needed unless Hb drops to 7 or bleeding issues occur.   Diabetes mellitus without complication On Lantus. Sliding scale insulin   Prerenal acute renal failure, resolved There is  mildly elevated creatinine noted, likely related to infection. Continue aggressive IV fluid resuscitation  Mild hypotension, resolved This could be related to early sepsis. Continue IV fluid resuscitation  DVT prophylaxis On Heparin subq   Full Code  Discharge planning If afebrile in the next 48 hours, he can be DC with oral antibiotics. I will return to see  him tomorrow.  All questions were answered. The patient knows to call the clinic with any problems, questions or concerns.    Aparna Vanderweele, MD 03/24/2014  1:12 PM

## 2014-03-24 NOTE — Progress Notes (Addendum)
Patient ID: Tyler Willis, male   DOB: 23-Sep-1950, 64 y.o.   MRN: [id number] [facility] PROGRESS NOTE  HUSAYN REIM IWP:[id number] DOB: 02/04/1951 DOA: 03/22/2014 PCP: Gennette Pac, MD  Brief narrative:    64 y.o. male with past medical history of multiple myeloma, amyloidosis, receiving chemotherapy who presented to [facility] ED with complaints of fever, cough for past 2 days prior to this admission. Patient was recently hospitalized for treatment of pneumonia. On admission, blood pressure was 89/42, heart 395, respiratory rate 22, T max 102.5 F and oxygen saturation 92% on nasal cannula oxygen support. Blood work revealed hemoglobin of 11.7 otherwise unremarkable. Chest x-ray showed no acute cardiopulmonary findings. Patient was admitted to stepdown unit for treatment of possible sepsis. He was started on broad-spectrum antibiotics, vancomycin and Zosyn.   Assessment/Plan:     Principal problem: Sepsis secondary to possible HCAP  Sepsis criteria met on the admission with vital signs that included hypotension, tachycardia, tachypnea, fever and hypoxia. Suspected source of infection - pneumonia.  Patient was started on broad-spectrum antibiotics on the admission, vancomycin and Zosyn. We will continue current antibiotic regimen.  Blood cultures are pending this morning.  Patient is hemodynamically stable and can be transferred to telemetry unit today. Order placed.  Active Problems: Acute hypoxic respiratory failure  Likely secondary to healthcare associated pneumonia.   Patient's respiratory status is stable.  Use DuoNeb as needed for shortness of breath or wheezing.  Multiple myeloma  With concurrent amyloidosis.   Patient is s/p C2 D1 chemo with dexamethasone 20 mg weekly, Cytoxan 300 mg weekly and Velcade.  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  Appreciate oncology seen the patient in consultation. Treatment is on hold because of  infection.  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  Hemoglobin is 10.9. No current indications for transfusion.  Diabetes mellitus without complication  Continue Lantus 5 units at bedtime along with sliding scale insulin.  Acute renal failure  Likely prerenal versus due to vancomycin.  Creatinine has normalized with IV fluids.   DVT Prophylaxis   Heparin subcutaneous ordered  Code Status: Full.  Family Communication:  plan of care discussed with the patient Disposition Plan: trasnfer to telemetry unit today; order placed.   IV access:  Peripheral IV  Procedures and diagnostic studies:    Dg Chest 2 View 03/22/2014    No acute cardiopulmonary abnormality seen.    Medical Consultants:  Oncology, Dr. Heath Lark  Other Consultants:  None   IAnti-Infectives:   Vancomycin 03/22/2014 --> Zosyn 03/22/2014 -->    Leisa Lenz, MD  [facility] Pager [phone]  If 7PM-7AM, please contact night-coverage [email or web address] Password [facility] 03/24/2014, 8:53 AM   LOS: 2 days    HPI/Subjective: No acute overnight events.  Objective: Filed Vitals:   03/24/14 0344 03/24/14 0400 03/24/14 0600 03/24/14 0800  BP:  113/61 109/48 126/67  Pulse:  80 69 73  Temp: 98.4 F (36.9 C)   98.5 F (36.9 C)  TempSrc: Oral   Oral  Resp:  27 14 16   Height:      Weight:      SpO2:  93% 95% 93%    Intake/Output Summary (Last 24 hours) at 03/24/14 0853 Last data filed at 03/24/14 0600  Gross per 24 hour  Intake   3160 ml  Output   4876 ml  Net  -1716 ml  Exam:   General:  Pt is alert, follows commands appropriately, not in acute distress  Cardiovascular: Regular rate and rhythm, S1/S2, no murmurs  Respiratory: coarse breath sounds, no wheezing  Abdomen: Soft, non tender, non distended, bowel sounds present  Extremities: No edema,  pulses DP and PT palpable bilaterally  Neuro: Grossly nonfocal  Data Reviewed: Basic Metabolic Panel:  Recent Labs Lab 03/19/14 0840 03/22/14 1809 03/23/14 0340 03/24/14 0357  NA 138 134* 134* 133*  K 3.8 3.6 4.0 4.1  CL  --  102 101 102  CO2 25 27 29 28   GLUCOSE 162* 99 100* 97  BUN 17.8 12 13 11   CREATININE 1.2 1.20 1.42* 1.31  CALCIUM 9.3 8.5 8.3* 7.8*   Liver Function Tests:  Recent Labs Lab 03/19/14 0840 03/22/14 1809 03/23/14 0340  AST 32 33 38*  ALT 46 32 41  ALKPHOS 95 93 87  BILITOT 0.88 0.9 0.5  PROT 7.4 7.2 6.7  ALBUMIN 3.8 3.9 3.6   No results for input(s): LIPASE, AMYLASE in the last 168 hours. No results for input(s): AMMONIA in the last 168 hours. CBC:  Recent Labs Lab 03/19/14 0840 03/22/14 1809 03/23/14 0340 03/24/14 0357  WBC 4.7 7.1 6.0 5.1  NEUTROABS 2.9 5.3  --   --   HGB 12.2* 11.7* 11.0* 10.9*  HCT 37.5* 35.8* 34.0* 33.2*  MCV 91.9 92.7 93.9 92.5  PLT 351 210 206 166   Cardiac Enzymes: No results for input(s): CKTOTAL, CKMB, CKMBINDEX, TROPONINI in the last 168 hours. BNP: Invalid input(s): POCBNP CBG:  Recent Labs Lab 03/23/14 0720 03/23/14 0822 03/23/14 1234 03/23/14 1618 03/23/14 2125  GLUCAP 82 131* 82 110* 96    Recent Results (from the past 240 hour(s))  MRSA PCR Screening     Status: None   Collection Time: 03/22/14 10:59 PM  Result Value Ref Range Status   MRSA by PCR NEGATIVE NEGATIVE Final     Scheduled Meds: . acyclovir  400 mg Oral Daily  . aspirin  81 mg Oral Daily  . calcium citrate  200 mg of elemental calcium Oral Daily  . cholecalciferol  2,000 Units Oral q morning - 94W  . folic acid  1 mg Oral Daily  . heparin  5,000 Units Subcutaneous 3 times per day  . insulin aspart  0-15 Units Subcutaneous TID WC  . insulin glargine  5 Units Subcutaneous QHS  . magnesium oxide  400 mg Oral Daily  . metFORMIN  500 mg Oral BID WC  . multivitamin with minerals  1 tablet Oral Daily  . pantoprazole  40 mg  Oral Daily  . piperacillin-tazobactam (ZOSYN)  IV  3.375 g Intravenous Q8H  . pravastatin  40 mg Oral Daily  . sodium chloride  3 mL Intravenous Q12H  . thiamine  100 mg Oral Daily  . vancomycin  1,000 mg Intravenous Q12H   Continuous Infusions: . sodium chloride 75 mL/hr at 03/24/14 0207

## 2014-03-25 ENCOUNTER — Other Ambulatory Visit: Payer: Self-pay | Admitting: Hematology and Oncology

## 2014-03-25 LAB — GLUCOSE, CAPILLARY
GLUCOSE-CAPILLARY: 80 mg/dL (ref 70–99)
Glucose-Capillary: 134 mg/dL — ABNORMAL HIGH (ref 70–99)
Glucose-Capillary: 85 mg/dL (ref 70–99)
Glucose-Capillary: 123 mg/dL — ABNORMAL HIGH (ref 70–99)

## 2014-03-25 LAB — HEMOGLOBIN A1C
Hgb A1c MFr Bld: 6.4 % — ABNORMAL HIGH (ref <5.7)
Mean Plasma Glucose: 137 mg/dL — ABNORMAL HIGH (ref <117)

## 2014-03-25 LAB — VANCOMYCIN, TROUGH: Vancomycin Tr: 19.1 ug/mL (ref 10.0–20.0)

## 2014-03-25 MED ORDER — GUAIFENESIN 100 MG/5ML PO SOLN
200.0000 mg | ORAL | Status: DC | PRN
  Administered 2014-03-25 (×3): 200 mg via ORAL
  Filled 2014-03-25 (×3): qty 10

## 2014-03-25 NOTE — Progress Notes (Signed)
Tyler Willis   DOB:Jul 27, 1950   AY#:[id number]    Subjective: He is feeling well. She has nonproductive cough. He is afebrile. He felt ready to be discharged.  Objective:  Filed Vitals:   03/25/14 0602  BP: 123/81  Pulse: 75  Temp: 98 F (36.7 C)  Resp: 20     Intake/Output Summary (Last 24 hours) at 03/25/14 1337 Last data filed at 03/25/14 1046  Gross per 24 hour  Intake    850 ml  Output   5850 ml  Net  -5000 ml    GENERAL:alert, no distress and comfortable SKIN: skin color, texture, turgor are normal, no rashes or significant lesions EYES: normal, Conjunctiva are pink and non-injected, sclera clear OROPHARYNX:no exudate, no erythema and lips, buccal mucosa, and tongue normal  NECK: supple, thyroid normal size, non-tender, without nodularity LYMPH:  no palpable lymphadenopathy in the cervical, axillary or inguinal LUNGS: clear to auscultation and percussion with normal breathing effort HEART: regular rate & rhythm and no murmurs and no lower extremity edema ABDOMEN:abdomen soft, non-tender and normal bowel sounds Musculoskeletal:no cyanosis of digits and no clubbing  NEURO: alert & oriented x 3 with fluent speech, no focal motor/sensory deficits   Labs:  Lab Results  Component Value Date   WBC 5.1 03/24/2014   HGB 10.9* 03/24/2014   HCT 33.2* 03/24/2014   MCV 92.5 03/24/2014   PLT 166 03/24/2014   NEUTROABS 5.3 03/22/2014    Lab Results  Component Value Date   NA 133* 03/24/2014   K 4.1 03/24/2014   CL 102 03/24/2014   CO2 28 03/24/2014    Assessment & Plan:  Multiple myeloma Complex situation due to concurrent diagnosis of multiple myeloma and amyloidosis at the same time. Currently he is s/p C2 D1 chemo with dexamethasone 20 mg weekly, Cytoxan 300 mg weekly and Velcade on days 1, 4, 7 and 11. His treatment is placed on hold for now until his fever and chills resolved. I have counseled his treatment on 03/26/2014 but plan to resume his treatment on  03/29/2013.  Amyloidosis Cardiology evaluation is scheduled for January to exclude cardiac amyloidosis  Again, hold treatment as above  Fever, resolved In the setting of recent chemotherapy and possible infectious illness, rule out bacterial versus viral. Cultures are pending. Chest X ray and Urinalysis are negative No neutropenia noted to date. Continue IV antibiotics with Vanco and Cefepime, antivirals, IV fluids, antipyretics.  Monitor counts closely. 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 Continue to monitor for now.  No transfusion needed unless Hb drops to 7 or bleeding issues occur.   Diabetes mellitus without complication On Lantus. Sliding scale insulin   Prerenal acute renal failure, resolved There is mildly elevated creatinine noted, likely related to infection. Continue aggressive IV fluid resuscitation  Mild hypotension, resolved This could be related to early sepsis. Continue IV fluid resuscitation  DVT prophylaxis On Heparin subq   Full Code  Discharge planning If afebrile in the next 24 hours, he can be DC with oral antibiotics. I will sign off. He has appointment to see me as an  outpatient on 04/09/2014.  All questions were answered. The patient knows to call the clinic with any problems, questions or concerns.     [facility], [place], MD 03/25/2014  1:37 PM

## 2014-03-25 NOTE — Progress Notes (Signed)
ANTIBIOTIC CONSULT NOTE   Pharmacy Consult for Zosyn/Vancomycin Indication: Febrile neutropenia/Sepsis  No Known Allergies  Patient Measurements: Height: 6' (182.9 cm) Weight: 195 lb 5.2 oz (88.6 kg) IBW/kg (Calculated) : 77.6   Vital Signs: Temp: 98 F (36.7 C) (01/03 0602) Temp Source: Oral (01/03 0602) BP: 123/81 mmHg (01/03 0602) Pulse Rate: 75 (01/03 0602) Intake/Output from previous day: 01/02 0701 - 01/03 0700 In: 1225 [I.V.:675; IV Piggyback:550] Out: 2536 [Urine:7825; Stool:1] Intake/Output from this shift:    Labs:  Recent Labs  03/22/14 1809 03/23/14 0340 03/24/14 0357  WBC 7.1 6.0 5.1  HGB 11.7* 11.0* 10.9*  PLT 210 206 166  CREATININE 1.20 1.42* 1.31   Estimated Creatinine Clearance: 65 mL/min (by C-G formula based on Cr of 1.31).  Recent Labs  03/25/14 0936  VANCOTROUGH 19.1     Microbiology: Recent Results (from the past 720 hour(s))  Blood Culture (routine x 2)     Status: None   Collection Time: 03/08/14 10:06 PM  Result Value Ref Range Status   Specimen Description BLOOD LEFT HAND  Final   Special Requests BOTTLES DRAWN AEROBIC AND ANAEROBIC 10ML  Final   Culture  Setup Time   Final    03/09/2014 04:41 Performed at [facility]   Final    NO GROWTH 5 DAYS Performed at [facility]    Report Status 03/15/2014 FINAL  Final  Blood Culture (routine x 2)     Status: None   Collection Time: 03/08/14 10:22 PM  Result Value Ref Range Status   Specimen Description BLOOD RIGHT ARM  Final   Special Requests BOTTLES DRAWN AEROBIC AND ANAEROBIC 5CC  Final   Culture  Setup Time   Final    03/09/2014 04:40 Performed at [facility]   Final    NO GROWTH 5 DAYS Performed at [facility]    Report Status 03/15/2014 FINAL  Final  Urine culture     Status: None   Collection Time: 03/08/14 11:36 PM  Result Value Ref Range Status   Specimen Description URINE, CLEAN CATCH  Final   Special  Requests NONE  Final   Culture  Setup Time   Final    03/09/2014 05:07 Performed at [facility] Performed at [facility]   Final   Culture NO GROWTH Performed at [facility]   Final   Report Status 03/10/2014 FINAL  Final  MRSA PCR Screening     Status: None   Collection Time: 03/22/14 10:59 PM  Result Value Ref Range Status   MRSA by PCR NEGATIVE NEGATIVE Final    Comment:        The GeneXpert MRSA Assay (FDA approved for NASAL specimens only), is one component of a comprehensive MRSA colonization surveillance program. It is not intended to diagnose MRSA infection nor to guide or monitor treatment for MRSA infections.     Assessment: 60 yoM diagnosed with multiple myeloma. Currently undergoing CyborD chemotherapy regimen which consists of oral Cytoxan andVelcade subcutaneous injections.   Patient called the [place] 12/31 with complaint of fever to maximum 102. He is also complained of a one-week history of significant nasal congestion and mild cough. Patient states that he was discharged from the hospital following a diagnosis of pneumonia and sepsis on 03/11/2014.Marland Kitchen He completed a course of Levaquin on 03/16/2014. Zosyn and Vancomycin per Rx for Sepsis.  12/31 >>  Zosyn >> 12/31 >> Vanc >>   Tmax: 102, afebrile x 24h WBCs: wnl Renal: SCr better, normalized CrCl = 65m/min  12/31 MRSA PCR: neg 12/31 blood x 2: pending  Drug level / dose changes info: 1/3 0930 VT = 19.174m/ml on 1gm IV q12h (prior to 6th dose)  Goal of Therapy:  Vancomycin trough level 15-20 mcg/ml  Plan:  Day #3 vancomycin/zosyn  Continue Zosyn 3.375 gm IV q8h  EI  Vancomycin trough acceptable, continue Vancomycin 1Gm IV q12h  F/u SCr/levels/cultures as needed  Possible de-escalation tomorrow as per physician note  DuDoreene ElandPharmD, BCPS.   Pager: [phone]/05/2014,10:38 AM

## 2014-03-25 NOTE — Progress Notes (Signed)
Patient ID: Tyler Willis, male   DOB: Aug 14, 1950, 64 y.o.   MRN: [id number] [facility] PROGRESS NOTE  IGNATZ DEIS BTD:[id number] DOB: 1950/07/09 DOA: 03/22/2014 PCP: Gennette Pac, MD  Brief narrative:    64 y.o. male with past medical history of multiple myeloma, amyloidosis, receiving chemotherapy who presented to [facility] ED with complaints of fever, cough for past 2 days prior to this admission. Patient was recently hospitalized for treatment of pneumonia. On admission, blood pressure was 89/42, heart 395, respiratory rate 22, T max 102.5 F and oxygen saturation 92% on nasal cannula oxygen support. Blood work revealed hemoglobin of 11.7 otherwise unremarkable. Chest x-ray showed no acute cardiopulmonary findings. Patient was admitted to stepdown unit for treatment of possible sepsis. He was started on broad-spectrum antibiotics, vancomycin and Zosyn.   Assessment/Plan:    Principal problem: Sepsis secondary to possible HCAP  Sepsis criteria met on the admission with vital signs that included hypotension, tachycardia, tachypnea, fever and hypoxia. Suspected source of infection - pneumonia.  Patient was started on broad-spectrum antibiotics on the admission, vancomycin and Zosyn. We will continue current antibiotic regimen. We will change to Levaquin in next 24 hours.   Blood cultures still pending.  Active Problems: Acute hypoxic respiratory failure  Likely secondary to healthcare associated pneumonia.   Patient's respiratory status is stable.  Use DuoNeb as needed for shortness of breath or wheezing.  Multiple myeloma  With concurrent amyloidosis.   Patient is s/p C2 D1 chemo with dexamethasone 20 mg weekly, Cytoxan 300 mg weekly and Velcade.  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  Appreciate oncology seen the patient in consultation. Treatment is on hold because of infection.  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  Hemoglobin is 10.9. No current indications for transfusion.  Diabetes mellitus without complication  Continue Lantus 5 units at bedtime along with sliding scale insulin.  CBG's in past 24 hours: 90, 143, 80   Acute renal failure  Likely prerenal versus due to vancomycin.  Creatinine has normalized with IV fluids.   DVT Prophylaxis   SCD's bilaterally   Code Status: Full.  Family Communication:  plan of care discussed with the patient and his wife at the bedside Disposition Plan: home in next 24 hours   IV access:  Peripheral IV  Procedures and diagnostic studies:    Dg Chest 2 View 03/22/2014    No acute cardiopulmonary abnormality seen.   Medical Consultants:  Oncology, Dr. Heath Lark  Other Consultants:  None   IAnti-Infectives:   Vancomycin 03/22/2014 --> Zosyn 03/22/2014 -->   Leisa Lenz, MD  [facility] Pager [phone]  If 7PM-7AM, please contact night-coverage [email or web address] Password [email or web address] 03/25/2014, 10:15 AM   LOS: 3 days    HPI/Subjective: No acute overnight events.  Objective: Filed Vitals:   03/24/14 1200 03/24/14 1345 03/24/14 2145 03/25/14 0602  BP: 127/74 120/67 109/79 123/81  Pulse: 74 65 70 75  Temp: 97.2 F (36.2 C) 97.7 F (36.5 C) 98.3 F (36.8 C) 98 F (36.7 C)  TempSrc: Axillary Oral Oral Oral  Resp: 14 20 20 20   Height:      Weight:      SpO2: 95% 97% 94% 95%    Intake/Output Summary (Last 24 hours) at 03/25/14 1015 Last data filed at 03/25/14 0610  Gross per 24 hour  Intake   1000 ml  Output   7350 ml  Net  -6350 ml    Exam:   General:  Pt is alert, follows commands appropriately, not in acute distress  Cardiovascular: Regular rate and rhythm, S1/S2 (+)  Respiratory: Clear to auscultation bilaterally, no wheezing, no crackles, no rhonchi  Abdomen: non tender, bowel sounds  present  Extremities: No edema, pulses DP and PT palpable bilaterally  Neuro: Grossly nonfocal  Data Reviewed: Basic Metabolic Panel:  Recent Labs Lab 03/19/14 0840 03/22/14 1809 03/23/14 0340 03/24/14 0357  NA 138 134* 134* 133*  K 3.8 3.6 4.0 4.1  CL  --  102 101 102  CO2 25 27 29 28   GLUCOSE 162* 99 100* 97  BUN 17.8 12 13 11   CREATININE 1.2 1.20 1.42* 1.31  CALCIUM 9.3 8.5 8.3* 7.8*   Liver Function Tests:  Recent Labs Lab 03/19/14 0840 03/22/14 1809 03/23/14 0340  AST 32 33 38*  ALT 46 32 41  ALKPHOS 95 93 87  BILITOT 0.88 0.9 0.5  PROT 7.4 7.2 6.7  ALBUMIN 3.8 3.9 3.6   No results for input(s): LIPASE, AMYLASE in the last 168 hours. No results for input(s): AMMONIA in the last 168 hours. CBC:  Recent Labs Lab 03/19/14 0840 03/22/14 1809 03/23/14 0340 03/24/14 0357  WBC 4.7 7.1 6.0 5.1  NEUTROABS 2.9 5.3  --   --   HGB 12.2* 11.7* 11.0* 10.9*  HCT 37.5* 35.8* 34.0* 33.2*  MCV 91.9 92.7 93.9 92.5  PLT 351 210 206 166   Cardiac Enzymes: No results for input(s): CKTOTAL, CKMB, CKMBINDEX, TROPONINI in the last 168 hours. BNP: Invalid input(s): POCBNP CBG:  Recent Labs Lab 03/24/14 0804 03/24/14 1154 03/24/14 1632 03/24/14 2146 03/25/14 0726  GLUCAP 118* 78 90 143* 80    Recent Results (from the past 240 hour(s))  MRSA PCR Screening     Status: None   Collection Time: 03/22/14 10:59 PM  Result Value Ref Range Status   MRSA by PCR NEGATIVE NEGATIVE Final     Scheduled Meds: . acyclovir  400 mg Oral Daily  . aspirin  81 mg Oral Daily  . calcium citrate  200 mg of elemental calcium Oral Daily  . cholecalciferol  2,000 Units Oral q morning - 51V  . folic acid  1 mg Oral Daily  . heparin  5,000 Units Subcutaneous 3 times per day  . insulin aspart  0-15 Units Subcutaneous TID WC  . insulin glargine  5 Units Subcutaneous QHS  . magnesium oxide  400 mg Oral Daily  . metFORMIN  500 mg Oral BID WC  . multivitamin with minerals  1 tablet  Oral Daily  . pantoprazole  40 mg Oral Daily  . piperacillin-tazobactam (ZOSYN)  IV  3.375 g Intravenous Q8H  . pravastatin  40 mg Oral Daily  . sodium chloride  3 mL Intravenous Q12H  . thiamine  100 mg Oral Daily  . vancomycin  1,000 mg Intravenous Q12H   Continuous Infusions: . sodium chloride 75 mL/hr at 03/24/14 0207

## 2014-03-26 ENCOUNTER — Other Ambulatory Visit: Payer: BC Managed Care – PPO

## 2014-03-26 ENCOUNTER — Ambulatory Visit: Payer: BC Managed Care – PPO

## 2014-03-26 DIAGNOSIS — A047 Enterocolitis due to Clostridium difficile: Secondary | ICD-10-CM

## 2014-03-26 LAB — GLUCOSE, CAPILLARY
GLUCOSE-CAPILLARY: 78 mg/dL (ref 70–99)
GLUCOSE-CAPILLARY: 75 mg/dL (ref 70–99)

## 2014-03-26 LAB — CLOSTRIDIUM DIFFICILE BY PCR: Toxigenic C. Difficile by PCR: POSITIVE — AB

## 2014-03-26 MED ORDER — BENZONATATE 100 MG PO CAPS: 100.0000 mg | ORAL_CAPSULE | Freq: Three times a day (TID) | ORAL | Status: DC | PRN

## 2014-03-26 MED ORDER — METRONIDAZOLE 500 MG PO TABS
500.0000 mg | ORAL_TABLET | Freq: Three times a day (TID) | ORAL | Status: DC
  Administered 2014-03-26: 500 mg via ORAL
  Filled 2014-03-26 (×3): qty 1

## 2014-03-26 MED ORDER — GUAIFENESIN 100 MG/5ML PO SOLN: 200.0000 mg | ORAL | Status: DC | PRN

## 2014-03-26 MED ORDER — HYDROCORTISONE 2.5 % RE CREA: TOPICAL_CREAM | Freq: Two times a day (BID) | RECTAL | Status: DC

## 2014-03-26 MED ORDER — LEVOFLOXACIN 750 MG PO TABS: 750.0000 mg | ORAL_TABLET | Freq: Every day | ORAL | Status: DC

## 2014-03-26 MED ORDER — FAMOTIDINE 40 MG PO TABS: 40.0000 mg | ORAL_TABLET | Freq: Every day | ORAL | Status: DC

## 2014-03-26 MED ORDER — LEVOFLOXACIN 750 MG PO TABS
750.0000 mg | ORAL_TABLET | Freq: Every day | ORAL | Status: DC
  Administered 2014-03-26: 750 mg via ORAL
  Filled 2014-03-26: qty 1

## 2014-03-26 MED ORDER — ACETAMINOPHEN 325 MG PO TABS: 650.0000 mg | ORAL_TABLET | Freq: Four times a day (QID) | ORAL | Status: DC | PRN

## 2014-03-26 MED ORDER — HYDROCORTISONE 2.5 % RE CREA
TOPICAL_CREAM | Freq: Two times a day (BID) | RECTAL | Status: DC
  Administered 2014-03-26: 12:00:00 via RECTAL
  Filled 2014-03-26: qty 28.35

## 2014-03-26 MED ORDER — FOLIC ACID 1 MG PO TABS: 1.0000 mg | ORAL_TABLET | Freq: Every day | ORAL | Status: DC

## 2014-03-26 MED ORDER — METRONIDAZOLE 500 MG PO TABS: 500.0000 mg | ORAL_TABLET | Freq: Three times a day (TID) | ORAL | Status: DC

## 2014-03-26 NOTE — Progress Notes (Signed)
ANTIBIOTIC CONSULT NOTE - FOLLOW UP  Pharmacy Consult for Levaquin PO Indication: CAP  No Known Allergies  Patient Measurements: Height: 6' (182.9 cm) Weight: 190 lb 4.1 oz (86.3 kg) IBW/kg (Calculated) : 77.6  Vital Signs: Temp: 97.9 F (36.6 C) (01/04 0532) Temp Source: Oral (01/04 0532) BP: 127/75 mmHg (01/04 0532) Pulse Rate: 67 (01/04 0532) Intake/Output from previous day: 01/03 0701 - 01/04 0700 In: 4010 [P.O.:1660; I.V.:1800; IV Piggyback:550] Out: -   Labs:  Recent Labs  03/24/14 0357  WBC 5.1  HGB 10.9*  PLT 166  CREATININE 1.31   Estimated Creatinine Clearance: 65 mL/min (by C-G formula based on Cr of 1.31).  Recent Labs  03/25/14 0936  VANCOTROUGH 19.1      Assessment: 44 yoM diagnosed with multiple myeloma. Currently undergoing CyborD chemotherapy regimen which consists of oral Cytoxan andVelcade subcutaneous injections.  He reported fever (max 102), nasal congestion and cough to cancer center on 12/31.  He has recent history of pneumonia/sepsis (discharged on 12/20) and completed course of LEvaquin on 12/25. Pharmacy initially consulted to dose Zosyn and Vancomycin for Sepsis, but now asked to change to Levaquin PO for CAP.  12/31 >> Zosyn >> 1/4 12/31 >> Vanc >> 1/4 1/4 >> Levaquin PO >>  Today, 03/26/2014:   Day # 4 antibiotics  Tmax: 102, afebrile now  WBCs: wnl (last on 1/2)  Renal: SCr 1.31 (last on 1/2), CrCl ~ 60 ml/min Normalized.   Blood cultures ngtd, CDiff PCR in process   Goal of Therapy:  Appropriate abx dosing, eradication of infection.   Plan:   Levaquin 742m PO q24h.  Pharmacy to sign off note writing, but will follow peripherally.  Renal dosing as needed and follow cultures as available.  CGretta ArabPharmD, BCPS Pager [phone]/06/2014 7:53 AM

## 2014-03-26 NOTE — Progress Notes (Signed)
Pt reported 5 stools in past 24 hours.  The 2 he has had on this shift have been soft/loose greenish/dark.  Collected C-diff as per nurse initiated protocol.

## 2014-03-26 NOTE — Discharge Summary (Signed)
Physician Discharge Summary  Tyler Willis URK:[id number] DOB: 1951/01/07 DOA: 03/22/2014  PCP: Gennette Pac, MD  Admit date: 03/22/2014 Discharge date: 03/26/2014  Recommendations for Outpatient Follow-up:  1. Please note that stool was positive for C. difficile. Prescription was provided for Flagyl for total of 2 weeks. You may take it for 10 days but if diarrhea persists then continue for total of 2 weeks. 2. Please note that omeprazole was stopped prior to discharge. You may use Pepcid for stomach mucosa protection. Pepcid is better tolerated with C. difficile infection. 3. Please take Levaquin for 6 more days on discharge for treatment of possible pneumonia. 4. Please follow-up with oncology per scheduled appointment.  Discharge Diagnoses:  Active Problems:   Multiple myeloma   Fever   Hypotension   Blood poisoning   HCAP (healthcare-associated pneumonia)   Anemia of chronic disease   Sepsis    Discharge Condition: stable   Diet recommendation: as tolerated   History of present illness:  64 y.o. male with past medical history of multiple myeloma, amyloidosis, receiving chemotherapy who presented to [facility] ED with complaints of fever, cough for past 2 days prior to this admission. Patient was recently hospitalized for treatment of pneumonia. On admission, blood pressure was 89/42, heart 395, respiratory rate 22, T max 102.5 F and oxygen saturation 92% on nasal cannula oxygen support. Blood work revealed hemoglobin of 11.7 otherwise unremarkable. Chest x-ray showed no acute cardiopulmonary findings. Patient was admitted to stepdown unit for treatment of possible sepsis. He was started on broad-spectrum antibiotics, vancomycin and Zosyn. Hospital course complicated with development of C. difficile. Patient was placed on Flagyl for 10 days up to 2 weeks on discharge.   Assessment/Plan:    Principal problem: Sepsis secondary to possible HCAP  Sepsis criteria met on the  admission with vital signs that included hypotension, tachycardia, tachypnea, fever and hypoxia. Suspected source of infection - pneumonia.  Patient was started on broad-spectrum antibiotics on the admission, vancomycin and Zosyn. Antibiotic treatment was changed to Levaquin on 03/25/2014. Patient tolerated Levaquin well and he will continue this on discharge for 6 more days.   Blood cultures to date are negative.   Active Problems: C. difficile diarrhea  Patient started on Flagyl which he will take for at least 10 days on discharge in 1-2 weeks if diarrhea persists.  Please note we changed PPI therapy from omeprazole to Pepcid on discharge  Acute hypoxic respiratory failure  Likely secondary to healthcare associated pneumonia.   Patient's respiratory status is stable.  Multiple myeloma  With concurrent amyloidosis.   Patient is s/p C2 D1 chemo with dexamethasone 20 mg weekly, Cytoxan 300 mg weekly and Velcade.  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  Appreciate oncology seen the patient in consultation. Treatment is on hold because of infection.  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  Hemoglobin is 10.9. No current indications for transfusion.  Diabetes mellitus without complication  Continue Lantus 5 units at bedtime   Acute renal failure  Likely prerenal versus due to vancomycin.  Creatinine has normalized with IV fluids.   DVT Prophylaxis   SCD's bilaterally  Code Status: Full.  Family Communication: plan of care discussed with the patient and his wife at the bedside   IV access:  Peripheral IV  Procedures and diagnostic studies:   Dg Chest 2 View 03/22/2014 No acute cardiopulmonary abnormality  seen.   Medical Consultants:  Oncology, Dr. Heath Lark  Other Consultants:  None    IAnti-Infectives:   Vancomycin 03/22/2014 --> 03/25/2013  Zosyn 03/22/2014 --> 03/25/2013  Levaquin 03/25/2013 --> for 6 days on discharge Flagyl for up to 2 weeks on discharge.   SignedLeisa Lenz, MD  [facility] 03/26/2014, 10:23 AM  Pager #: [phone]   Discharge Exam: Filed Vitals:   03/26/14 0532  BP: 127/75  Pulse: 67  Temp: 97.9 F (36.6 C)  Resp: 18   Filed Vitals:   03/25/14 0602 03/25/14 1339 03/25/14 2141 03/26/14 0532  BP: 123/81 133/74 101/76 127/75  Pulse: 75 69 68 67  Temp: 98 F (36.7 C) 97.4 F (36.3 C) 97.8 F (36.6 C) 97.9 F (36.6 C)  TempSrc: Oral Oral Oral Oral  Resp: 20 18 18 18   Height:      Weight:    86.3 kg (190 lb 4.1 oz)  SpO2: 95% 98% 96% 97%    General: Pt is alert, follows commands appropriately, not in acute distress Cardiovascular: Regular rate and rhythm, S1/S2 +, no murmurs Respiratory: Clear to auscultation bilaterally, no wheezing, no crackles, no rhonchi Abdominal: Soft, non tender, non distended, bowel sounds +, no guarding Extremities: no edema, no cyanosis, pulses palpable bilaterally DP and PT Neuro: Grossly nonfocal  Discharge Instructions  Discharge Instructions    Call MD for:  difficulty breathing, headache or visual disturbances    Complete by:  As directed      Call MD for:  persistant dizziness or light-headedness    Complete by:  As directed      Call MD for:  persistant nausea and vomiting    Complete by:  As directed      Call MD for:  severe uncontrolled pain    Complete by:  As directed      Diet - low sodium heart healthy    Complete by:  As directed      Discharge instructions    Complete by:  As directed   1. Please note that stool was positive for C. difficile. Prescription was provided for Flagyl for total of 2 weeks. You may take it for 10 days but if diarrhea persists then continue for total of 2 weeks. 2. Please note that omeprazole was stopped prior to discharge. You may use  Pepcid for stomach mucosa protection. Pepcid is better tolerated with C. difficile infection. 3. Please take Levaquin for 6 more days on discharge for treatment of possible pneumonia. 4. Please follow-up with oncology per scheduled appointment.     Increase activity slowly    Complete by:  As directed             Medication List    STOP taking these medications        guaiFENesin-codeine 100-10 MG/5ML syrup     omeprazole 20 MG capsule  Commonly known as:  PRILOSEC     ondansetron 8 MG tablet  Commonly known as:  ZOFRAN      TAKE these medications        acetaminophen 325 MG tablet  Commonly known as:  TYLENOL  Take 2 tablets (650 mg total) by mouth every 6 (six) hours as needed for mild pain (or Fever >/= 101).     acyclovir 400 MG tablet  Commonly known as:  ZOVIRAX  Take 1 tablet (400 mg total) by mouth daily.     aspirin 81 MG chewable tablet  Chew 81 mg by  mouth daily.     BAYER CONTOUR NEXT TEST test strip  Generic drug:  glucose blood     benzonatate 100 MG capsule  Commonly known as:  TESSALON  Take 1 capsule (100 mg total) by mouth 3 (three) times daily as needed for cough.     calcium citrate 950 MG tablet  Commonly known as:  CALCITRATE - dosed in mg elemental calcium  Take 200 mg of elemental calcium by mouth daily.     cyclophosphamide 50 MG tablet  Commonly known as:  CYTOXAN  Take 6 tablets on days 1,8,15 of chemo 1hr before or 2hr after meals every 21d.     DAYQUIL PO  Take by mouth.     dexamethasone 4 MG tablet  Commonly known as:  DECADRON  Take 10 pills every Monday with breakfast     famotidine 40 MG tablet  Commonly known as:  PEPCID  Take 1 tablet (40 mg total) by mouth daily.     folic acid 1 MG tablet  Commonly known as:  FOLVITE  Take 1 tablet (1 mg total) by mouth daily.     guaiFENesin 100 MG/5ML Soln  Commonly known as:  ROBITUSSIN  Take 10 mLs (200 mg total) by mouth every 4 (four) hours as needed.     hydrocortisone  2.5 % rectal cream  Commonly known as:  ANUSOL-HC  Place rectally 2 (two) times daily.     insulin glargine 100 UNIT/ML injection  Commonly known as:  LANTUS  Inject 5 Units into the skin at bedtime.     levofloxacin 750 MG tablet  Commonly known as:  LEVAQUIN  Take 1 tablet (750 mg total) by mouth daily.     Magnesium 400 MG Tabs  Take 400 mg by mouth daily.     metFORMIN 500 MG tablet  Commonly known as:  GLUCOPHAGE  Take 500 mg by mouth 2 (two) times daily with a meal.     metroNIDAZOLE 500 MG tablet  Commonly known as:  FLAGYL  Take 1 tablet (500 mg total) by mouth every 8 (eight) hours.     multivitamin tablet  Take 1 tablet by mouth daily.     naproxen sodium 220 MG tablet  Commonly known as:  ANAPROX  Take 220 mg by mouth once.     polyethylene glycol packet  Commonly known as:  MIRALAX / GLYCOLAX  Take 17 g by mouth daily.     pravastatin 40 MG tablet  Commonly known as:  PRAVACHOL  Take 40 mg by mouth daily. Daily     prochlorperazine 10 MG tablet  Commonly known as:  COMPAZINE  Take 1 tablet (10 mg total) by mouth every 6 (six) hours as needed (Nausea or vomiting).     psyllium 0.52 G capsule  Commonly known as:  REGULOID  Take 5 capsules by mouth daily.     Vitamin D3 2000 UNITS Tabs  Take by mouth.           Follow-up Information    Follow up with Gennette Pac, MD. Schedule an appointment as soon as possible for a visit in 2 weeks.   Specialty:  Family Medicine   Contact information:   [address] [place] [phone]        The results of significant diagnostics from this hospitalization (including imaging, microbiology, ancillary and laboratory) are listed below for reference.    Significant Diagnostic Studies: Dg Chest 2 View  03/22/2014  CLINICAL DATA:  Fever, current history of multiple myeloma.  EXAM: CHEST  2 VIEW  COMPARISON:  March 10, 2014.  FINDINGS: The heart size and mediastinal contours are  within normal limits. Both lungs are clear. No pneumothorax or pleural effusion is noted. The visualized skeletal structures are unremarkable.  IMPRESSION: No acute cardiopulmonary abnormality seen.   Electronically Signed   By: Sabino Dick M.D.   On: 03/22/2014 18:57   Dg Chest 2 View  03/10/2014   CLINICAL DATA:  Fever and cough after chemotherapy; multiple myeloma  EXAM: CHEST  2 VIEW  COMPARISON:  March 08, 2014  FINDINGS: There is patchy consolidation in the left base with small left effusion. Elsewhere lungs are clear. Heart size and pulmonary vascularity are within normal limits. No adenopathy. Changes of multiple myeloma are again noted, primarily in the clavicles.  IMPRESSION: Patchy consolidation left base. Small left effusion. Lungs otherwise clear. Scattered bony changes consistent with known multiple myeloma.   Electronically Signed   By: Lowella Grip M.D.   On: 03/10/2014 08:09   Dg Chest Port 1 View  03/08/2014   CLINICAL DATA:  Multiple myeloma.  Fever.  EXAM: PORTABLE CHEST - 1 VIEW  COMPARISON:  Most recent chest x-ray comparison 11/29/2007  FINDINGS: Normal heart size. Stable mild aortic tortuosity. There is minimal atelectasis or scarring at the left base. There is no edema, consolidation, effusion, or pneumothorax. Myelomatous changes, best visualized in the bilateral clavicles.  IMPRESSION: Negative for pneumonia.   Electronically Signed   By: Jorje Guild M.D.   On: 03/08/2014 22:20    Microbiology: Culture, blood (routine x 2)     Status: None (Preliminary result)   Collection Time: 03/22/14  6:08 PM  Result Value Ref Range Status   Specimen Description BLOOD RIGHT HAND  Final   Special Requests BOTTLES DRAWN AEROBIC AND ANAEROBIC 4ML  Final   Culture   Final           BLOOD CULTURE RECEIVED NO GROWTH TO DATE CULTURE WILL BE HELD FOR 5 DAYS BEFORE ISSUING A FINAL NEGATIVE REPORT Performed at [facility]    Report Status PENDING  Incomplete  Culture,  blood (routine x 2)     Status: None (Preliminary result)   Collection Time: 03/22/14  6:08 PM  Result Value Ref Range Status   Specimen Description BLOOD RIGHT ANTECUBITAL  Final   Special Requests BOTTLES DRAWN AEROBIC AND ANAEROBIC 5ML  Final   Culture   Final           BLOOD CULTURE RECEIVED NO GROWTH TO DATE CULTURE WILL BE HELD FOR 5 DAYS BEFORE ISSUING A FINAL NEGATIVE REPORT Performed at [facility]    Report Status PENDING  Incomplete  MRSA PCR Screening     Status: None   Collection Time: 03/22/14 10:59 PM  Result Value Ref Range Status   MRSA by PCR NEGATIVE NEGATIVE Final  Clostridium Difficile by PCR     Status: Abnormal   Collection Time: 03/25/14 10:12 PM  Result Value Ref Range Status   C difficile by pcr POSITIVE (A) NEGATIVE Final    Comment:      Labs: Basic Metabolic Panel:  Recent Labs Lab 03/22/14 1809 03/23/14 0340 03/24/14 0357  NA 134* 134* 133*  K 3.6 4.0 4.1  CL 102 101 102  CO2 27 29 28   GLUCOSE 99 100* 97  BUN 12 13 11   CREATININE 1.20 1.42* 1.31  CALCIUM 8.5 8.3* 7.8*  Liver Function Tests:  Recent Labs Lab 03/22/14 1809 03/23/14 0340  AST 33 38*  ALT 32 41  ALKPHOS 93 87  BILITOT 0.9 0.5  PROT 7.2 6.7  ALBUMIN 3.9 3.6   No results for input(s): LIPASE, AMYLASE in the last 168 hours. No results for input(s): AMMONIA in the last 168 hours. CBC:  Recent Labs Lab 03/22/14 1809 03/23/14 0340 03/24/14 0357  WBC 7.1 6.0 5.1  NEUTROABS 5.3  --   --   HGB 11.7* 11.0* 10.9*  HCT 35.8* 34.0* 33.2*  MCV 92.7 93.9 92.5  PLT 210 206 166   Cardiac Enzymes: No results for input(s): CKTOTAL, CKMB, CKMBINDEX, TROPONINI in the last 168 hours. BNP: BNP (last 3 results) No results for input(s): PROBNP in the last 8760 hours. CBG:  Recent Labs Lab 03/25/14 0726 03/25/14 1127 03/25/14 1743 03/25/14 2139 03/26/14 0736  GLUCAP 80 134* 85 123* 78    Time coordinating discharge: Over 30 minutes

## 2014-03-26 NOTE — Discharge Instructions (Signed)
Pneumonia Pneumonia is an infection of the lungs.  CAUSES Pneumonia may be caused by bacteria or a virus. Usually, these infections are caused by breathing infectious particles into the lungs (respiratory tract). SIGNS AND SYMPTOMS   Cough.  Fever.  Chest pain.  Increased rate of breathing.  Wheezing.  Mucus production. DIAGNOSIS  If you have the common symptoms of pneumonia, your health care provider will typically confirm the diagnosis with a chest X-ray. The X-ray will show an abnormality in the lung (pulmonary infiltrate) if you have pneumonia. Other tests of your blood, urine, or sputum may be done to find the specific cause of your pneumonia. Your health care provider may also do tests (blood gases or pulse oximetry) to see how well your lungs are working. TREATMENT  Some forms of pneumonia may be spread to other people when you cough or sneeze. You may be asked to wear a mask before and during your exam. Pneumonia that is caused by bacteria is treated with antibiotic medicine. Pneumonia that is caused by the influenza virus may be treated with an antiviral medicine. Most other viral infections must run their course. These infections will not respond to antibiotics.  HOME CARE INSTRUCTIONS   Cough suppressants may be used if you are losing too much rest. However, coughing protects you by clearing your lungs. You should avoid using cough suppressants if you can.  Your health care provider may have prescribed medicine if he or she thinks your pneumonia is caused by bacteria or influenza. Finish your medicine even if you start to feel better.  Your health care provider may also prescribe an expectorant. This loosens the mucus to be coughed up.  Take medicines only as directed by your health care provider.  Do not smoke. Smoking is a common cause of bronchitis and can contribute to pneumonia. If you are a smoker and continue to smoke, your cough may last several weeks after your  pneumonia has cleared.  A cold steam vaporizer or humidifier in your room or home may help loosen mucus.  Coughing is often worse at night. Sleeping in a semi-upright position in a recliner or using a couple pillows under your head will help with this.  Get rest as you feel it is needed. Your body will usually let you know when you need to rest. PREVENTION A pneumococcal shot (vaccine) is available to prevent a common bacterial cause of pneumonia. This is usually suggested for:  People over 65 years old.  Patients on chemotherapy.  People with chronic lung problems, such as bronchitis or emphysema.  People with immune system problems. If you are over 65 or have a high risk condition, you may receive the pneumococcal vaccine if you have not received it before. In some countries, a routine influenza vaccine is also recommended. This vaccine can help prevent some cases of pneumonia.You may be offered the influenza vaccine as part of your care. If you smoke, it is time to quit. You may receive instructions on how to stop smoking. Your health care provider can provide medicines and counseling to help you quit. SEEK MEDICAL CARE IF: You have a fever. SEEK IMMEDIATE MEDICAL CARE IF:   Your illness becomes worse. This is especially true if you are elderly or weakened from any other disease.  You cannot control your cough with suppressants and are losing sleep.  You begin coughing up blood.  You develop pain which is getting worse or is uncontrolled with medicines.  Any of the symptoms   which initially brought you in for treatment are getting worse rather than better.  You develop shortness of breath or chest pain. MAKE SURE YOU:   Understand these instructions.  Will watch your condition.  Will get help right away if you are not doing well or get worse. Document Released: 03/09/2005 Document Revised: 07/24/2013 Document Reviewed: 05/29/2010 [facility] Patient Information 2015  [place], [place]. This information is not intended to replace advice given to you by your health care provider. Make sure you discuss any questions you have with your health care provider. Clostridium Difficile Infection Clostridium difficile (C. difficile) is a bacteria found in the intestinal tract or colon. Under certain conditions, it causes diarrhea and sometimes severe disease. The severe form of the disease is known as pseudomembranous colitis (often called C. difficile colitis). This disease can damage the lining of the colon or cause the colon to become enlarged (toxic megacolon). CAUSES Your colon normally contains many different bacteria, including C. difficile. The balance of bacteria in your colon can change during illness. This is especially true when you take antibiotic medicine. Taking antibiotics may allow the C. difficile to grow, multiply excessively, and make a toxin that then causes illness. The elderly and people with certain medical conditions have a greater risk of getting C. difficile infections. SYMPTOMS  Watery diarrhea.  Fever.  Fatigue.  Loss of appetite.  Nausea.  Abdominal swelling, pain, or tenderness.  Dehydration. DIAGNOSIS Your symptoms may make your caregiver suspect a C. difficile infection, especially if you have used antibiotics in the preceding weeks. However, there are only 2 ways to know for certain whether you have a C. difficile infection:  A lab test that finds the toxin in your stool.  The specific appearance of an abnormality (pseudomembrane) in your colon. This can only be seen by doing a sigmoidoscopy or colonoscopy. These procedures involve passing an instrument through your rectum to look at the inside of your colon. Your caregiver will help determine if these tests are necessary. TREATMENT  Most people are successfully treated with one of two specific antibiotics, usually given by mouth. Other antibiotics you are receiving are stopped if  possible.  Intravenous (IV) fluids and correction of electrolyte imbalance may be necessary.  Rarely, surgery may be needed to remove the infected part of the intestines.  Careful hand washing by you and your caregivers is important to prevent the spread of infection. In the hospital, your caregivers may also put on gowns and gloves to prevent the spread of the C. difficile bacteria. Your room is also cleaned regularly with a solution containing bleach or a product that is known to kill C. difficile. HOME CARE INSTRUCTIONS  Drink enough fluids to keep your urine clear or pale yellow. Avoid milk, caffeine, and alcohol.  Ask your caregiver for specific rehydration instructions.  Try eating small, frequent meals rather than large meals.  Take your antibiotics as directed. Finish them even if you start to feel better.  Do not use medicines to slow diarrhea. This could delay healing or cause complications.  Wash your hands thoroughly after using the bathroom and before preparing food.  Make sure people who live with you wash their hands often, too.  Carefully disinfect all surfaces with a product that contains chlorine bleach. SEEK MEDICAL CARE IF:  Diarrhea persists longer than expected or recurs after completing your course of antibiotic treatment for the C. difficile infection.  You have trouble staying hydrated. SEEK IMMEDIATE MEDICAL CARE IF:  You develop  a new fever.  You have increasing abdominal pain or tenderness.  There is blood in your stools, or your stools are dark black and tarry.  You cannot hold down food or liquids. MAKE SURE YOU:  Understand these instructions.  Will watch your condition.  Will get help right away if you are not doing well or get worse. Document Released: 12/17/2004 Document Revised: 07/24/2013 Document Reviewed: 08/15/2010 [facility] Patient Information 2015 [place], [place]. This information is not intended to replace advice given to you by  your health care provider. Make sure you discuss any questions you have with your health care provider.

## 2014-03-27 ENCOUNTER — Telehealth: Payer: Self-pay | Admitting: *Deleted

## 2014-03-27 LAB — KAPPA/LAMBDA LIGHT CHAINS
Kappa free light chain: 13.3 mg/dL — ABNORMAL HIGH (ref 0.33–1.94)
Kappa, lambda light chain ratio: 73.89 — ABNORMAL HIGH (ref 0.26–1.65)
Lambda free light chains: 0.18 mg/dL — ABNORMAL LOW (ref 0.57–2.63)

## 2014-03-27 NOTE — Telephone Encounter (Signed)
No, C Diff is serious. Cancel Rx this week, and do not resume anything (even his weekly Monday dexamethasone and cytoxan) until I see him back on 1/18

## 2014-03-27 NOTE — Telephone Encounter (Signed)
Pt d/c'd from hospital yesterday reports stool was positive for C-diff.  He is being treated with Flagyl.  Pt asks if he should keep his appt for chemo this week 1/7 as scheduled?

## 2014-03-27 NOTE — Telephone Encounter (Signed)
Instructed Mr. Folkert and his wife to hold chemotherapy and dexamethasone until pt sees Dr. Alvy Bimler again on 1/18.  Instructed to not take cytoxan and dexamethasone at home and to not come in for velcade until 1/18.  Call us if he needs anything prior to next appt.Marland Kitchen  He and wife verbalized understanding.

## 2014-03-28 ENCOUNTER — Encounter (HOSPITAL_COMMUNITY): Payer: Self-pay

## 2014-03-28 LAB — PROTEIN ELECTROPHORESIS, SERUM
Albumin ELP: 51 % — ABNORMAL LOW (ref 55.8–66.1)
Alpha-1-Globulin: 5.2 % — ABNORMAL HIGH (ref 2.9–4.9)
Alpha-2-Globulin: 15 % — ABNORMAL HIGH (ref 7.1–11.8)
BETA 2: 3.6 % (ref 3.2–6.5)
Beta Globulin: 5.3 % (ref 4.7–7.2)
GAMMA GLOBULIN: 19.9 % — AB (ref 11.1–18.8)
M-Spike, %: 0.98 g/dL
TOTAL PROTEIN ELP: 6.3 g/dL (ref 6.0–8.3)

## 2014-03-29 ENCOUNTER — Ambulatory Visit: Payer: BC Managed Care – PPO

## 2014-03-29 LAB — CULTURE, BLOOD (ROUTINE X 2)
CULTURE: NO GROWTH
Culture: NO GROWTH

## 2014-03-30 ENCOUNTER — Encounter: Payer: Self-pay | Admitting: Cardiology

## 2014-03-30 ENCOUNTER — Ambulatory Visit (INDEPENDENT_AMBULATORY_CARE_PROVIDER_SITE_OTHER): Payer: BC Managed Care – PPO | Admitting: Cardiology

## 2014-03-30 VITALS — BP 128/68 | HR 81 | Ht 72.0 in | Wt 189.0 lb

## 2014-03-30 DIAGNOSIS — E859 Amyloidosis, unspecified: Secondary | ICD-10-CM

## 2014-03-30 DIAGNOSIS — E785 Hyperlipidemia, unspecified: Secondary | ICD-10-CM

## 2014-03-30 NOTE — Patient Instructions (Signed)
Your physician recommends that you continue on your current medications as directed. Please refer to the Current Medication list given to you today.  Your physician has requested that you have an echocardiogram. Echocardiography is a painless test that uses sound waves to create images of your heart. It provides your doctor with information about the size and shape of your heart and how well your heart's chambers and valves are working. This procedure takes approximately one hour. There are no restrictions for this procedure.  Your physician wants you to follow-up in: 6 months with Dr Nelson  You will receive a reminder letter in the mail two months in advance. If you don't receive a letter, please call our office to schedule the follow-up appointment.  

## 2014-03-30 NOTE — Progress Notes (Signed)
Patient ID: Tyler Willis, male   DOB: [birth year], 64 y.o.   MRN: [id number]    Patient Name: Tyler Willis Date of Encounter: 03/30/2014  Primary Care Provider:  Gennette Pac, MD Primary Cardiologist: Dorothy Spark  Problem List   Past Medical History  Diagnosis Date  . Diabetes mellitus without complication   . GERD (gastroesophageal reflux disease)   . 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 Multiple myeloma 02/19/2014   No past surgical history on file.  Allergies  No Known Allergies  HPI  64 year old gentleman with past medical history of multiple myeloma, amyloidosis, receiving chemotherapy who was recently hospitalized at [facility] for pneumonia.  He was referred to [place] by Dr. Alvy Bimler for evaluation of possible cardiac amyloidosis. Ever since the patient was diagnosed with multiple myeloma and starting chemotherapy he has felt overall fatigued and he is not physically active. However he denies any chest pain. He has no shortness of breath at rest. He denies any lower extremity edema, orthopnea or paroxysmal nocturnal dyspnea there no palpitations or history of syncope. He has no family history of premature coronary artery disease. He would feel short of breath on mild-to-moderate exertion. He denies any prior history of hypertension and states that he is rather hypotensive.  Home Medications  Prior to Admission medications   Medication Sig Start Date End Date Taking? Authorizing Provider  acetaminophen (TYLENOL) 325 MG tablet Take 2 tablets (650 mg total) by mouth every 6 (six) hours as needed for mild pain (or Fever >/= 101). 03/26/14  Yes Robbie Lis, MD  acyclovir (ZOVIRAX) 400 MG tablet Take 1 tablet (400 mg total) by mouth daily. 02/22/14  Yes Heath Lark, MD  aspirin 81 MG chewable tablet Chew 81 mg by mouth daily.   Yes Historical Provider, MD  BAYER CONTOUR NEXT TEST test strip  11/29/13  Yes Historical Provider, MD  benzonatate (TESSALON) 100 MG capsule  Take 1 capsule (100 mg total) by mouth 3 (three) times daily as needed for cough. 03/26/14  Yes Robbie Lis, MD  CALCIUM PO Take 1,000 mg by mouth daily.   Yes Historical Provider, MD  Cholecalciferol (VITAMIN D3) 2000 UNITS TABS Take by mouth.   Yes Historical Provider, MD  cyclophosphamide (CYTOXAN) 50 MG tablet Take 6 tablets on days 1,8,15 of chemo 1hr before or 2hr after meals every 21d. 02/22/14  Yes Heath Lark, MD  dexamethasone (DECADRON) 4 MG tablet Take 10 pills every Monday with breakfast 02/22/14  Yes Heath Lark, MD  famotidine (PEPCID) 40 MG tablet Take 1 tablet (40 mg total) by mouth daily. 03/26/14  Yes Robbie Lis, MD  folic acid (FOLVITE) 1 MG tablet Take 1 tablet (1 mg total) by mouth daily. 03/26/14  Yes Robbie Lis, MD  guaiFENesin (ROBITUSSIN) 100 MG/5ML SOLN Take 10 mLs (200 mg total) by mouth every 4 (four) hours as needed. 03/26/14  Yes Robbie Lis, MD  hydrocortisone (ANUSOL-HC) 2.5 % rectal cream Place rectally 2 (two) times daily. 03/26/14  Yes Robbie Lis, MD  insulin glargine (LANTUS) 100 UNIT/ML injection Inject 5 Units into the skin at bedtime.   Yes Historical Provider, MD  levofloxacin (LEVAQUIN) 750 MG tablet Take 1 tablet (750 mg total) by mouth daily. 03/26/14  Yes Robbie Lis, MD  Magnesium 400 MG TABS Take 400 mg by mouth daily.   Yes Historical Provider, MD  metFORMIN (GLUCOPHAGE) 500 MG tablet Take 500 mg by mouth 2 (two)  times daily with a meal.   Yes Historical Provider, MD  metroNIDAZOLE (FLAGYL) 500 MG tablet Take 1 tablet (500 mg total) by mouth every 8 (eight) hours. 03/26/14  Yes Robbie Lis, MD  Multiple Vitamin (MULTIVITAMIN) tablet Take 1 tablet by mouth daily.   Yes Historical Provider, MD  naproxen sodium (ANAPROX) 220 MG tablet Take 220 mg by mouth once.   Yes Historical Provider, MD  omeprazole (PRILOSEC) 20 MG capsule Take 20 mg by mouth daily.   Yes Historical Provider, MD  polyethylene glycol (MIRALAX / GLYCOLAX) packet Take 17 g by mouth daily.    Yes Historical Provider, MD  pravastatin (PRAVACHOL) 40 MG tablet Take 40 mg by mouth daily. Daily 11/20/13  Yes Historical Provider, MD  Pseudoephedrine-APAP-DM (DAYQUIL PO) Take by mouth.   Yes Historical Provider, MD  prochlorperazine (COMPAZINE) 10 MG tablet Take 1 tablet (10 mg total) by mouth every 6 (six) hours as needed (Nausea or vomiting). Patient not taking: Reported on 03/22/2014 02/22/14   Heath Lark, MD    Family History  Family History  Problem Relation Age of Onset  . Diabetes Father   . Cancer Maternal Uncle     lung cancer  . Cancer Cousin     myeloma    Social History  History   Social History  . Marital Status: Married    Spouse Name: N/A    Number of Children: N/A  . Years of Education: N/A   Occupational History  . Not on file.   Social History Main Topics  . Smoking status: Former Smoker    Types: Pipe  . Smokeless tobacco: Never Used  . Alcohol Use: No  . Drug Use: No  . Sexual Activity: Not on file   Other Topics Concern  . Not on file   Social History Narrative     Review of Systems, as per HPI, otherwise negative General:  No chills, fever, night sweats or weight changes.  Cardiovascular:  No chest pain, dyspnea on exertion, edema, orthopnea, palpitations, paroxysmal nocturnal dyspnea. Dermatological: No rash, lesions/masses Respiratory: No cough, dyspnea Urologic: No hematuria, dysuria Abdominal:   No nausea, vomiting, diarrhea, bright red blood per rectum, melena, or hematemesis Neurologic:  No visual changes, wkns, changes in mental status. All other systems reviewed and are otherwise negative except as noted above.  Physical Exam  Blood pressure 128/68, pulse 81, height 6' (1.829 m), weight 189 lb (85.73 kg).  General: Pleasant, NAD Psych: Normal affect. Neuro: Alert and oriented X 3. Moves all extremities spontaneously. HEENT: Normal  Neck: Supple without bruits or JVD. Lungs:  Resp regular and unlabored, CTA. Heart: RRR  no s3, s4, or murmurs. Abdomen: Soft, non-tender, non-distended, BS + x 4.  Extremities: No clubbing, cyanosis or edema. DP/PT/Radials 2+ and equal bilaterally.  Labs:  No results for input(s): CKTOTAL, CKMB, TROPONINI in the last 72 hours. Lab Results  Component Value Date   WBC 5.1 03/24/2014   HGB 10.9* 03/24/2014   HCT 33.2* 03/24/2014   MCV 92.5 03/24/2014   PLT 166 03/24/2014    No results found for: DDIMER Invalid input(s): POCBNP    Component Value Date/Time   NA 133* 03/24/2014 0357   NA 138 03/19/2014 0840   K 4.1 03/24/2014 0357   K 3.8 03/19/2014 0840   CL 102 03/24/2014 0357   CO2 28 03/24/2014 0357   CO2 25 03/19/2014 0840   GLUCOSE 97 03/24/2014 0357   GLUCOSE 162* 03/19/2014 0840   BUN  11 03/24/2014 0357   BUN 17.8 03/19/2014 0840   CREATININE 1.31 03/24/2014 0357   CREATININE 1.2 03/19/2014 0840   CALCIUM 7.8* 03/24/2014 0357   CALCIUM 9.3 03/19/2014 0840   PROT 6.7 03/23/2014 0340   PROT 7.4 03/19/2014 0840   ALBUMIN 3.6 03/23/2014 0340   ALBUMIN 3.8 03/19/2014 0840   AST 38* 03/23/2014 0340   AST 32 03/19/2014 0840   ALT 41 03/23/2014 0340   ALT 46 03/19/2014 0840   ALKPHOS 87 03/23/2014 0340   ALKPHOS 95 03/19/2014 0840   BILITOT 0.5 03/23/2014 0340   BILITOT 0.88 03/19/2014 0840   GFRNONAA 57* 03/24/2014 0357   GFRAA 66* 03/24/2014 0357   Lab Results  Component Value Date   CHOL 123 03/09/2014   HDL 19* 03/09/2014   LDLCALC 63 03/09/2014   TRIG 205* 03/09/2014    Accessory Clinical Findings  Echocardiogram - none  ECG - NSR, normal ECG.   Assessment & Plan  64 year old gentleman with multiple myelosis  1. Systemic amyloidosis, on therapy with Cytoxan, dexamethasone and Velcade - the patient has no signs of heart failure. His EKG is not suggestive of low voltage. We'll schedule an echocardiogram to evaluate for degree of LVH in signs of diastolic dysfunction. If there is a suspicion for amyloid deposit we will schedule a cardiac  MRI. For now we will continue the same medical regimen. If patient's echocardiogram is completely normal we would suggest that repeat echocardiogram is performed in 6 months from now.  2. Blood pressure - controlled  3. Hyperlipidemia - elevated triglycerides, patient started on pravastatin, we agree with that.  Follow-up in 6 months.   Dorothy Spark, MD, [facility] 03/30/2014, 10:46 AM

## 2014-04-05 ENCOUNTER — Telehealth: Payer: Self-pay | Admitting: *Deleted

## 2014-04-05 ENCOUNTER — Other Ambulatory Visit: Payer: Self-pay

## 2014-04-05 ENCOUNTER — Ambulatory Visit (HOSPITAL_COMMUNITY): Payer: BC Managed Care – PPO | Attending: Cardiovascular Disease | Admitting: Radiology

## 2014-04-05 DIAGNOSIS — E859 Amyloidosis, unspecified: Secondary | ICD-10-CM

## 2014-04-05 DIAGNOSIS — E119 Type 2 diabetes mellitus without complications: Secondary | ICD-10-CM | POA: Insufficient documentation

## 2014-04-05 DIAGNOSIS — Z87891 Personal history of nicotine dependence: Secondary | ICD-10-CM | POA: Insufficient documentation

## 2014-04-05 DIAGNOSIS — I2510 Atherosclerotic heart disease of native coronary artery without angina pectoris: Secondary | ICD-10-CM

## 2014-04-05 NOTE — Progress Notes (Signed)
Echocardiogram performed.  

## 2014-04-05 NOTE — Telephone Encounter (Signed)
Informed this pt that per Dr Meda Coffee his echo was normal and was not suggestive of cardiac amyloidosis. Informed the pt that per Dr Meda Coffee there is no need for an MRI at this time, that she will follow-up with him in one year with a repeated echo prior to that visit.  Informed the pt that I will put the order in and send our schedulers a message to set up the one year f/u OV and repeat echo prior to it.  Pt verbalized understanding and agrees with this plan.

## 2014-04-05 NOTE — Telephone Encounter (Signed)
-----   Message from Dorothy Spark, MD sent at 04/05/2014 11:11 AM EST ----- The patient has normal echo that is not suggestive of cardiac amyloidosis. No MRI needed at this time. I would follow up in 1 year with repeated echocardiogram.

## 2014-04-09 ENCOUNTER — Ambulatory Visit (HOSPITAL_BASED_OUTPATIENT_CLINIC_OR_DEPARTMENT_OTHER): Payer: BC Managed Care – PPO | Admitting: Hematology and Oncology

## 2014-04-09 ENCOUNTER — Other Ambulatory Visit (HOSPITAL_BASED_OUTPATIENT_CLINIC_OR_DEPARTMENT_OTHER): Payer: BC Managed Care – PPO

## 2014-04-09 ENCOUNTER — Ambulatory Visit (HOSPITAL_BASED_OUTPATIENT_CLINIC_OR_DEPARTMENT_OTHER): Payer: BC Managed Care – PPO

## 2014-04-09 ENCOUNTER — Telehealth: Payer: Self-pay | Admitting: *Deleted

## 2014-04-09 ENCOUNTER — Encounter: Payer: Self-pay | Admitting: Hematology and Oncology

## 2014-04-09 ENCOUNTER — Telehealth: Payer: Self-pay | Admitting: Hematology and Oncology

## 2014-04-09 VITALS — BP 140/77 | HR 75 | Temp 98.1°F | Resp 18 | Ht 72.0 in | Wt 187.3 lb

## 2014-04-09 DIAGNOSIS — Z5112 Encounter for antineoplastic immunotherapy: Secondary | ICD-10-CM

## 2014-04-09 DIAGNOSIS — D638 Anemia in other chronic diseases classified elsewhere: Secondary | ICD-10-CM

## 2014-04-09 DIAGNOSIS — C9000 Multiple myeloma not having achieved remission: Secondary | ICD-10-CM

## 2014-04-09 DIAGNOSIS — A0472 Enterocolitis due to Clostridium difficile, not specified as recurrent: Secondary | ICD-10-CM | POA: Insufficient documentation

## 2014-04-09 DIAGNOSIS — A047 Enterocolitis due to Clostridium difficile: Secondary | ICD-10-CM

## 2014-04-09 DIAGNOSIS — E859 Amyloidosis, unspecified: Secondary | ICD-10-CM

## 2014-04-09 LAB — CBC WITH DIFFERENTIAL/PLATELET
BASO%: 0.4 % (ref 0.0–2.0)
Basophils Absolute: 0 10*3/uL (ref 0.0–0.1)
EOS%: 5.7 % (ref 0.0–7.0)
Eosinophils Absolute: 0.4 10*3/uL (ref 0.0–0.5)
HCT: 38.6 % (ref 38.4–49.9)
HEMOGLOBIN: 12.7 g/dL — AB (ref 13.0–17.1)
LYMPH#: 3.1 10*3/uL (ref 0.9–3.3)
LYMPH%: 42.8 % (ref 14.0–49.0)
MCH: 30.2 pg (ref 27.2–33.4)
MCHC: 32.9 g/dL (ref 32.0–36.0)
MCV: 91.9 fL (ref 79.3–98.0)
MONO#: 0.8 10*3/uL (ref 0.1–0.9)
MONO%: 10.9 % (ref 0.0–14.0)
NEUT#: 2.9 10*3/uL (ref 1.5–6.5)
NEUT%: 40.2 % (ref 39.0–75.0)
PLATELETS: 237 10*3/uL (ref 140–400)
RBC: 4.2 10*6/uL (ref 4.20–5.82)
RDW: 15.7 % — AB (ref 11.0–14.6)
WBC: 7.3 10*3/uL (ref 4.0–10.3)

## 2014-04-09 LAB — COMPREHENSIVE METABOLIC PANEL (CC13)
ALK PHOS: 82 U/L (ref 40–150)
ALT: 21 U/L (ref 0–55)
AST: 31 U/L (ref 5–34)
Albumin: 3.9 g/dL (ref 3.5–5.0)
Anion Gap: 10 mEq/L (ref 3–11)
BUN: 11.5 mg/dL (ref 7.0–26.0)
CO2: 26 meq/L (ref 22–29)
Calcium: 9 mg/dL (ref 8.4–10.4)
Chloride: 104 mEq/L (ref 98–109)
Creatinine: 1.1 mg/dL (ref 0.7–1.3)
EGFR: 70 mL/min/{1.73_m2} — AB (ref >90)
GLUCOSE: 161 mg/dL — AB (ref 70–140)
Potassium: 4 mEq/L (ref 3.5–5.1)
Sodium: 139 mEq/L (ref 136–145)
TOTAL PROTEIN: 7.7 g/dL (ref 6.4–8.3)
Total Bilirubin: 0.78 mg/dL (ref 0.20–1.20)

## 2014-04-09 MED ORDER — BORTEZOMIB CHEMO SQ INJECTION 3.5 MG (2.5MG/ML)
1.3000 mg/m2 | Freq: Once | INTRAMUSCULAR | Status: AC
  Administered 2014-04-09: 2.75 mg via SUBCUTANEOUS
  Filled 2014-04-09: qty 2.75

## 2014-04-09 MED ORDER — ONDANSETRON HCL 8 MG PO TABS
8.0000 mg | ORAL_TABLET | Freq: Once | ORAL | Status: AC
  Administered 2014-04-09: 8 mg via ORAL

## 2014-04-09 MED ORDER — DEXAMETHASONE 4 MG PO TABS
40.0000 mg | ORAL_TABLET | Freq: Once | ORAL | Status: AC
  Administered 2014-04-09: 40 mg via ORAL

## 2014-04-09 MED ORDER — ONDANSETRON HCL 8 MG PO TABS
ORAL_TABLET | ORAL | Status: AC
  Filled 2014-04-09: qty 1

## 2014-04-09 NOTE — Assessment & Plan Note (Signed)
Recent myeloma protein suggests that he had partial response only after 2 cycles of treatment. However, the patient has encountered multiple hospitalization for infection. I recommend reducing the dose of Cytoxan from 300 mg to 200 mg, 3 weeks on and one-week off. We will keep the rest of the chemotherapy dosage to same.

## 2014-04-09 NOTE — Assessment & Plan Note (Signed)
Thankfully, he does not have secondary amyloidosis involving his heart. The patient should be transplant candidate.

## 2014-04-09 NOTE — Telephone Encounter (Signed)
Pt will be bringing [employer] policy in for Dr Alvy Bimler to sign

## 2014-04-09 NOTE — Patient Instructions (Signed)
[facility] Discharge Instructions for Patients Receiving Chemotherapy  Today you received the following chemotherapy agents:  Velcade  To help prevent nausea and vomiting after your treatment, we encourage you to take your nausea medication as ordered per MD.   If you develop nausea and vomiting that is not controlled by your nausea medication, call the clinic.   BELOW ARE SYMPTOMS THAT SHOULD BE REPORTED IMMEDIATELY:  *FEVER GREATER THAN 100.5 F  *CHILLS WITH OR WITHOUT FEVER  NAUSEA AND VOMITING THAT IS NOT CONTROLLED WITH YOUR NAUSEA MEDICATION  *UNUSUAL SHORTNESS OF BREATH  *UNUSUAL BRUISING OR BLEEDING  TENDERNESS IN MOUTH AND THROAT WITH OR WITHOUT PRESENCE OF ULCERS  *URINARY PROBLEMS  *BOWEL PROBLEMS  UNUSUAL RASH Items with * indicate a potential emergency and should be followed up as soon as possible.  Feel free to call the clinic you have any questions or concerns. The clinic phone number is [phone].    

## 2014-04-09 NOTE — Progress Notes (Signed)
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 congo red staing reveals amyloid deposition within some of the amorphous material and within blood vessels. Normal cytogenetics.   02/14/2014 Imaging PET CT scan showed diffuse skeletal involvement   02/26/2014 -  Chemotherapy He is started on chemotherapy with Velcade, Cytoxan and dexamethasone.    INTERVAL HISTORY: Please see below for problem oriented charting. He returns prior to cycle 3 of therapy. He developed recent C. difficile colitis from exposure to antibiotics. He denies any GI symptoms. He still had mild cough but it is nonproductive. Denies fevers or chills.  REVIEW OF SYSTEMS:   Constitutional: Denies fevers, chills or abnormal weight loss Eyes: Denies blurriness of vision Ears, nose, mouth, throat, and face: Denies mucositis or sore throat Cardiovascular: Denies palpitation, chest discomfort or lower extremity swelling Gastrointestinal:  Denies nausea, heartburn or change in  bowel habits Skin: Denies abnormal skin rashes Lymphatics: Denies new lymphadenopathy or easy bruising Neurological:Denies numbness, tingling or new weaknesses Behavioral/Psych: Mood is stable, no new changes  All other systems were reviewed with the patient and are negative.  I have reviewed the past medical history, past surgical history, social history and family history with the patient and they are unchanged from previous note.  ALLERGIES:  has No Known Allergies.  MEDICATIONS:  Current Outpatient Prescriptions  Medication Sig Dispense Refill  . acetaminophen (TYLENOL) 325 MG tablet Take 2 tablets (650 mg total) by mouth every 6 (six) hours as needed for mild pain (or Fever >/= 101). 30 tablet 0  . acyclovir (ZOVIRAX) 400 MG tablet Take 1 tablet (400 mg total) by mouth daily. 60 tablet 3  . aspirin 81 MG chewable tablet Chew 81 mg by mouth daily.    .Marland KitchenCALCIUM PO Take 1,000 mg by mouth daily.    . Cholecalciferol (VITAMIN D3) 2000 UNITS TABS Take by mouth.    . folic acid (FOLVITE) 1 MG tablet Take 1 tablet (1 mg total) by mouth daily. 30 tablet 0  . guaiFENesin (ROBITUSSIN) 100 MG/5ML SOLN Take 10 mLs (200 mg total) by mouth every 4 (four) hours as needed. 1200 mL 0  . hydrocortisone (ANUSOL-HC) 2.5 % rectal cream Place rectally 2 (two) times daily. 30 g 1  . insulin glargine (LANTUS) 100 UNIT/ML injection Inject 5 Units into the skin at bedtime.    .Marland Kitchenlevofloxacin (LEVAQUIN) 750 MG tablet Take 1 tablet (750 mg total) by mouth daily. 6 tablet 0  . Magnesium 400 MG TABS Take 400 mg by mouth daily.    . metFORMIN (GLUCOPHAGE) 500 MG tablet Take 500 mg by mouth 2 (two) times daily  with a meal.    . metroNIDAZOLE (FLAGYL) 500 MG tablet Take 1 tablet (500 mg total) by mouth every 8 (eight) hours. 42 tablet 0  . Multiple Vitamin (MULTIVITAMIN) tablet Take 1 tablet by mouth daily.    Marland Kitchen omeprazole (PRILOSEC) 20 MG capsule Take 20 mg by mouth daily.    . pravastatin (PRAVACHOL) 40 MG tablet  Take 40 mg by mouth daily. Daily  1  . BAYER CONTOUR NEXT TEST test strip   5  . benzonatate (TESSALON) 100 MG capsule Take 1 capsule (100 mg total) by mouth 3 (three) times daily as needed for cough. (Patient not taking: Reported on 04/09/2014) 20 capsule 0  . cyclophosphamide (CYTOXAN) 50 MG tablet Take 6 tablets on days 1,8,15 of chemo 1hr before or 2hr after meals every 21d. (Patient not taking: Reported on 04/09/2014) 60 tablet 3  . dexamethasone (DECADRON) 4 MG tablet Take 10 pills every Monday with breakfast (Patient not taking: Reported on 04/09/2014) 90 tablet 1  . famotidine (PEPCID) 40 MG tablet Take 1 tablet (40 mg total) by mouth daily. 30 tablet 0  . naproxen sodium (ANAPROX) 220 MG tablet Take 220 mg by mouth once.    . polyethylene glycol (MIRALAX / GLYCOLAX) packet Take 17 g by mouth daily.    . prochlorperazine (COMPAZINE) 10 MG tablet Take 1 tablet (10 mg total) by mouth every 6 (six) hours as needed (Nausea or vomiting). (Patient not taking: Reported on 03/22/2014) 30 tablet 1  . Pseudoephedrine-APAP-DM (DAYQUIL PO) Take by mouth.     No current facility-administered medications for this visit.    PHYSICAL EXAMINATION: ECOG PERFORMANCE STATUS: 1 - Symptomatic but completely ambulatory  Filed Vitals:   04/09/14 0820  BP: 140/77  Pulse: 75  Temp: 98.1 F (36.7 C)  Resp: 18   Filed Weights   04/09/14 0820  Weight: 187 lb 4.8 oz (84.959 kg)    GENERAL:alert, no distress and comfortable SKIN: skin color, texture, turgor are normal, no rashes or significant lesions EYES: normal, Conjunctiva are pink and non-injected, sclera clear OROPHARYNX:no exudate, no erythema and lips, buccal mucosa, and tongue normal  NECK: supple, thyroid normal size, non-tender, without nodularity LYMPH:  no palpable lymphadenopathy in the cervical, axillary or inguinal LUNGS: clear to auscultation and percussion with normal breathing effort HEART: regular rate & rhythm and no murmurs and no  lower extremity edema ABDOMEN:abdomen soft, non-tender and normal bowel sounds Musculoskeletal:no cyanosis of digits and no clubbing  NEURO: alert & oriented x 3 with fluent speech, no focal motor/sensory deficits  LABORATORY DATA:  I have reviewed the data as listed    Component Value Date/Time   NA 139 04/09/2014 0808   NA 133* 03/24/2014 0357   K 4.0 04/09/2014 0808   K 4.1 03/24/2014 0357   CL 102 03/24/2014 0357   CO2 26 04/09/2014 0808   CO2 28 03/24/2014 0357   GLUCOSE 161* 04/09/2014 0808   GLUCOSE 97 03/24/2014 0357   BUN 11.5 04/09/2014 0808   BUN 11 03/24/2014 0357   CREATININE 1.1 04/09/2014 0808   CREATININE 1.31 03/24/2014 0357   CALCIUM 9.0 04/09/2014 0808   CALCIUM 7.8* 03/24/2014 0357   PROT 7.7 04/09/2014 0808   PROT 6.7 03/23/2014 0340   ALBUMIN 3.9 04/09/2014 0808   ALBUMIN 3.6 03/23/2014 0340   AST 31 04/09/2014 0808   AST 38* 03/23/2014 0340   ALT 21 04/09/2014 0808   ALT 41 03/23/2014 0340   ALKPHOS 82 04/09/2014 1601  ALKPHOS 87 03/23/2014 0340   BILITOT 0.78 04/09/2014 0808   BILITOT 0.5 03/23/2014 0340   GFRNONAA 57* 03/24/2014 0357   GFRAA 66* 03/24/2014 0357    No results found for: SPEP, UPEP  Lab Results  Component Value Date   WBC 7.3 04/09/2014   NEUTROABS 2.9 04/09/2014   HGB 12.7* 04/09/2014   HCT 38.6 04/09/2014   MCV 91.9 04/09/2014   PLT 237 04/09/2014      Chemistry      Component Value Date/Time   NA 139 04/09/2014 0808   NA 133* 03/24/2014 0357   K 4.0 04/09/2014 0808   K 4.1 03/24/2014 0357   CL 102 03/24/2014 0357   CO2 26 04/09/2014 0808   CO2 28 03/24/2014 0357   BUN 11.5 04/09/2014 0808   BUN 11 03/24/2014 0357   CREATININE 1.1 04/09/2014 0808   CREATININE 1.31 03/24/2014 0357      Component Value Date/Time   CALCIUM 9.0 04/09/2014 0808   CALCIUM 7.8* 03/24/2014 0357   ALKPHOS 82 04/09/2014 0808   ALKPHOS 87 03/23/2014 0340   AST 31 04/09/2014 0808   AST 38* 03/23/2014 0340   ALT 21 04/09/2014 0808    ALT 41 03/23/2014 0340   BILITOT 0.78 04/09/2014 0808   BILITOT 0.5 03/23/2014 0340      ASSESSMENT & PLAN:  Multiple myeloma Recent myeloma protein suggests that he had partial response only after 2 cycles of treatment. However, the patient has encountered multiple hospitalization for infection. I recommend reducing the dose of Cytoxan from 300 mg to 200 mg, 3 weeks on and one-week off. We will keep the rest of the chemotherapy dosage to same.    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 The patient should be transplant candidate.   Colitis, Clostridium difficile He developed C. difficile colitis recently due to recurrent exposure to antibiotics. Clinically, his symptoms have resolved. I recommend conservative management with observation only at this point.   Anemia of chronic disease This is likely anemia of chronic disease. The patient denies recent history of bleeding such as epistaxis, hematuria or hematochezia. He is asymptomatic from the anemia. We will observe for now.  He does not require transfusion now.    I filled out paperwork to give him temporary disability parking permit.  All questions were answered. The patient knows to call the clinic with any problems, questions or concerns. No barriers to learning was detected. I spent 40 minutes counseling the patient face to face. The total time spent in the appointment was 55 minutes and more than 50% was on counseling and review of test results     [facility], [place], MD 04/09/2014 9:20 AM

## 2014-04-09 NOTE — Telephone Encounter (Signed)
Gave avs & cal for Jan/Feb. Sent mess to sch tx. °

## 2014-04-09 NOTE — Assessment & Plan Note (Signed)
This is likely anemia of chronic disease. The patient denies recent history of bleeding such as epistaxis, hematuria or hematochezia. He is asymptomatic from the anemia. We will observe for now.  He does not require transfusion now.   

## 2014-04-09 NOTE — Assessment & Plan Note (Signed)
He developed C. difficile colitis recently due to recurrent exposure to antibiotics. Clinically, his symptoms have resolved. I recommend conservative management with observation only at this point.

## 2014-04-09 NOTE — Telephone Encounter (Signed)
Per staff message and POF I have scheduled appts. Advised scheduler of appts. JMW  

## 2014-04-12 ENCOUNTER — Ambulatory Visit (HOSPITAL_BASED_OUTPATIENT_CLINIC_OR_DEPARTMENT_OTHER): Payer: BC Managed Care – PPO

## 2014-04-12 DIAGNOSIS — C9000 Multiple myeloma not having achieved remission: Secondary | ICD-10-CM

## 2014-04-12 DIAGNOSIS — Z5112 Encounter for antineoplastic immunotherapy: Secondary | ICD-10-CM

## 2014-04-12 MED ORDER — ONDANSETRON HCL 8 MG PO TABS
ORAL_TABLET | ORAL | Status: AC
  Filled 2014-04-12: qty 1

## 2014-04-12 MED ORDER — BORTEZOMIB CHEMO SQ INJECTION 3.5 MG (2.5MG/ML)
1.3000 mg/m2 | Freq: Once | INTRAMUSCULAR | Status: AC
  Administered 2014-04-12: 2.75 mg via SUBCUTANEOUS
  Filled 2014-04-12: qty 2.75

## 2014-04-12 MED ORDER — ONDANSETRON HCL 8 MG PO TABS
8.0000 mg | ORAL_TABLET | Freq: Once | ORAL | Status: AC
  Administered 2014-04-12: 8 mg via ORAL

## 2014-04-12 NOTE — Patient Instructions (Signed)
[facility] Discharge Instructions for Patients Receiving Chemotherapy  Today you received the following chemotherapy agents: Velcade  To help prevent nausea and vomiting after your treatment, we encourage you to take your nausea medication as prescribed by your physician.   If you develop nausea and vomiting that is not controlled by your nausea medication, call the clinic.   BELOW ARE SYMPTOMS THAT SHOULD BE REPORTED IMMEDIATELY:  *FEVER GREATER THAN 100.5 F  *CHILLS WITH OR WITHOUT FEVER  NAUSEA AND VOMITING THAT IS NOT CONTROLLED WITH YOUR NAUSEA MEDICATION  *UNUSUAL SHORTNESS OF BREATH  *UNUSUAL BRUISING OR BLEEDING  TENDERNESS IN MOUTH AND THROAT WITH OR WITHOUT PRESENCE OF ULCERS  *URINARY PROBLEMS  *BOWEL PROBLEMS  UNUSUAL RASH Items with * indicate a potential emergency and should be followed up as soon as possible.  Feel free to call the clinic you have any questions or concerns. The clinic phone number is [phone].    

## 2014-04-16 ENCOUNTER — Telehealth: Payer: Self-pay | Admitting: *Deleted

## 2014-04-16 ENCOUNTER — Ambulatory Visit (HOSPITAL_BASED_OUTPATIENT_CLINIC_OR_DEPARTMENT_OTHER): Payer: BC Managed Care – PPO

## 2014-04-16 ENCOUNTER — Other Ambulatory Visit (HOSPITAL_BASED_OUTPATIENT_CLINIC_OR_DEPARTMENT_OTHER): Payer: BC Managed Care – PPO

## 2014-04-16 ENCOUNTER — Telehealth: Payer: Self-pay | Admitting: Hematology and Oncology

## 2014-04-16 DIAGNOSIS — Z5112 Encounter for antineoplastic immunotherapy: Secondary | ICD-10-CM

## 2014-04-16 DIAGNOSIS — C9000 Multiple myeloma not having achieved remission: Secondary | ICD-10-CM

## 2014-04-16 LAB — CBC WITH DIFFERENTIAL/PLATELET
BASO%: 0.3 % (ref 0.0–2.0)
Basophils Absolute: 0 10*3/uL (ref 0.0–0.1)
EOS ABS: 0.2 10*3/uL (ref 0.0–0.5)
EOS%: 3.7 % (ref 0.0–7.0)
HEMATOCRIT: 40.5 % (ref 38.4–49.9)
HEMOGLOBIN: 13 g/dL (ref 13.0–17.1)
LYMPH%: 45.6 % (ref 14.0–49.0)
MCH: 29.6 pg (ref 27.2–33.4)
MCHC: 32.1 g/dL (ref 32.0–36.0)
MCV: 92.2 fL (ref 79.3–98.0)
MONO#: 0.8 10*3/uL (ref 0.1–0.9)
MONO%: 15.3 % — ABNORMAL HIGH (ref 0.0–14.0)
NEUT#: 1.8 10*3/uL (ref 1.5–6.5)
NEUT%: 35.1 % — ABNORMAL LOW (ref 39.0–75.0)
PLATELETS: 168 10*3/uL (ref 140–400)
RBC: 4.39 10*6/uL (ref 4.20–5.82)
RDW: 15.9 % — AB (ref 11.0–14.6)
WBC: 5.1 10*3/uL (ref 4.0–10.3)
lymph#: 2.3 10*3/uL (ref 0.9–3.3)

## 2014-04-16 LAB — COMPREHENSIVE METABOLIC PANEL (CC13)
ALK PHOS: 76 U/L (ref 40–150)
ALT: 38 U/L (ref 0–55)
AST: 34 U/L (ref 5–34)
Albumin: 3.9 g/dL (ref 3.5–5.0)
Anion Gap: 9 mEq/L (ref 3–11)
BILIRUBIN TOTAL: 0.81 mg/dL (ref 0.20–1.20)
BUN: 12.2 mg/dL (ref 7.0–26.0)
CO2: 24 mEq/L (ref 22–29)
CREATININE: 1 mg/dL (ref 0.7–1.3)
Calcium: 8.8 mg/dL (ref 8.4–10.4)
Chloride: 106 mEq/L (ref 98–109)
EGFR: 78 mL/min/{1.73_m2} — ABNORMAL LOW (ref >90)
GLUCOSE: 115 mg/dL (ref 70–140)
Potassium: 3.9 mEq/L (ref 3.5–5.1)
SODIUM: 139 meq/L (ref 136–145)
Total Protein: 7.6 g/dL (ref 6.4–8.3)

## 2014-04-16 MED ORDER — ONDANSETRON HCL 8 MG PO TABS
8.0000 mg | ORAL_TABLET | Freq: Once | ORAL | Status: AC
  Administered 2014-04-16: 8 mg via ORAL

## 2014-04-16 MED ORDER — ONDANSETRON HCL 8 MG PO TABS
ORAL_TABLET | ORAL | Status: AC
  Filled 2014-04-16: qty 1

## 2014-04-16 MED ORDER — BORTEZOMIB CHEMO SQ INJECTION 3.5 MG (2.5MG/ML)
1.3000 mg/m2 | Freq: Once | INTRAMUSCULAR | Status: AC
  Administered 2014-04-16: 2.75 mg via SUBCUTANEOUS
  Filled 2014-04-16: qty 2.75

## 2014-04-16 NOTE — Telephone Encounter (Signed)
Received call from primary MD stating need to schedule an appointment for prevnar 13 vaccination.  " but he is currently being worked up for transplant- and we were told he would get vaccinations after his transplant "  " does Dr Alvy Bimler want Schumpert to get the Prevnar 13 vaccine now ?"  Return call to Mrs Danh is  [phone].  THIS MESSAGE WILL BE SENT TO MD AND RN AT DESK FOR APPROPRIATE FOLLOW UP .

## 2014-04-16 NOTE — Telephone Encounter (Signed)
He can wait until after transplant

## 2014-04-16 NOTE — Telephone Encounter (Signed)
Informed wife for pt to wait until after his transplant to have the Prevnar vaccine.  She verbalized understanding.

## 2014-04-16 NOTE — Progress Notes (Signed)
Dr. Alvy Bimler aware of pt's Temp 102 this past Friday.  He is afebrile today and Dr. Alvy Bimler gave order ok to treat today as ordered w/ Velcade.

## 2014-04-16 NOTE — Patient Instructions (Signed)
[facility] Discharge Instructions for Patients Receiving Chemotherapy  Today you received the following chemotherapy agents Velcade SQ  To help prevent nausea and vomiting after your treatment, we encourage you to take your nausea medication as directed by MD   If you develop nausea and vomiting that is not controlled by your nausea medication, call the clinic.   BELOW ARE SYMPTOMS THAT SHOULD BE REPORTED IMMEDIATELY:  *FEVER GREATER THAN 100.5 F  *CHILLS WITH OR WITHOUT FEVER  NAUSEA AND VOMITING THAT IS NOT CONTROLLED WITH YOUR NAUSEA MEDICATION  *UNUSUAL SHORTNESS OF BREATH  *UNUSUAL BRUISING OR BLEEDING  TENDERNESS IN MOUTH AND THROAT WITH OR WITHOUT PRESENCE OF ULCERS  *URINARY PROBLEMS  *BOWEL PROBLEMS  UNUSUAL RASH Items with * indicate a potential emergency and should be followed up as soon as possible.  Feel free to call the clinic you have any questions or concerns. The clinic phone number is [phone].

## 2014-04-16 NOTE — Telephone Encounter (Signed)
Patient confirmed new appointment. Gave new calendar.

## 2014-04-19 ENCOUNTER — Ambulatory Visit (HOSPITAL_BASED_OUTPATIENT_CLINIC_OR_DEPARTMENT_OTHER): Payer: BC Managed Care – PPO

## 2014-04-19 ENCOUNTER — Encounter (HOSPITAL_COMMUNITY): Payer: Self-pay | Admitting: Emergency Medicine

## 2014-04-19 ENCOUNTER — Inpatient Hospital Stay (HOSPITAL_COMMUNITY)
Admission: EM | Admit: 2014-04-19 | Discharge: 2014-04-22 | DRG: 872 | Disposition: A | Discharge status: Home / Self Care | Payer: BC Managed Care – PPO | Attending: Internal Medicine | Admitting: Internal Medicine

## 2014-04-19 ENCOUNTER — Emergency Department (HOSPITAL_COMMUNITY): Payer: BC Managed Care – PPO

## 2014-04-19 ENCOUNTER — Other Ambulatory Visit: Payer: Self-pay | Admitting: *Deleted

## 2014-04-19 DIAGNOSIS — K649 Unspecified hemorrhoids: Secondary | ICD-10-CM | POA: Diagnosis present

## 2014-04-19 DIAGNOSIS — K921 Melena: Secondary | ICD-10-CM | POA: Diagnosis present

## 2014-04-19 DIAGNOSIS — C9000 Multiple myeloma not having achieved remission: Secondary | ICD-10-CM

## 2014-04-19 DIAGNOSIS — K219 Gastro-esophageal reflux disease without esophagitis: Secondary | ICD-10-CM | POA: Diagnosis present

## 2014-04-19 DIAGNOSIS — J329 Chronic sinusitis, unspecified: Secondary | ICD-10-CM | POA: Diagnosis present

## 2014-04-19 DIAGNOSIS — Z7982 Long term (current) use of aspirin: Secondary | ICD-10-CM

## 2014-04-19 DIAGNOSIS — R509 Fever, unspecified: Secondary | ICD-10-CM | POA: Diagnosis present

## 2014-04-19 DIAGNOSIS — E119 Type 2 diabetes mellitus without complications: Secondary | ICD-10-CM

## 2014-04-19 DIAGNOSIS — Z79899 Other long term (current) drug therapy: Secondary | ICD-10-CM

## 2014-04-19 DIAGNOSIS — Z87891 Personal history of nicotine dependence: Secondary | ICD-10-CM

## 2014-04-19 DIAGNOSIS — T451X5A Adverse effect of antineoplastic and immunosuppressive drugs, initial encounter: Secondary | ICD-10-CM | POA: Diagnosis present

## 2014-04-19 DIAGNOSIS — Z794 Long term (current) use of insulin: Secondary | ICD-10-CM

## 2014-04-19 DIAGNOSIS — C6290 Malignant neoplasm of unspecified testis, unspecified whether descended or undescended: Secondary | ICD-10-CM | POA: Diagnosis present

## 2014-04-19 DIAGNOSIS — A0472 Enterocolitis due to Clostridium difficile, not specified as recurrent: Secondary | ICD-10-CM | POA: Diagnosis present

## 2014-04-19 DIAGNOSIS — Z5112 Encounter for antineoplastic immunotherapy: Secondary | ICD-10-CM

## 2014-04-19 DIAGNOSIS — A047 Enterocolitis due to Clostridium difficile: Secondary | ICD-10-CM | POA: Diagnosis present

## 2014-04-19 DIAGNOSIS — R6510 Systemic inflammatory response syndrome (SIRS) of non-infectious origin without acute organ dysfunction: Secondary | ICD-10-CM | POA: Diagnosis not present

## 2014-04-19 DIAGNOSIS — D6959 Other secondary thrombocytopenia: Secondary | ICD-10-CM | POA: Diagnosis present

## 2014-04-19 DIAGNOSIS — C9001 Multiple myeloma in remission: Secondary | ICD-10-CM | POA: Diagnosis present

## 2014-04-19 DIAGNOSIS — D500 Iron deficiency anemia secondary to blood loss (chronic): Secondary | ICD-10-CM | POA: Diagnosis present

## 2014-04-19 DIAGNOSIS — R197 Diarrhea, unspecified: Secondary | ICD-10-CM | POA: Diagnosis not present

## 2014-04-19 DIAGNOSIS — E785 Hyperlipidemia, unspecified: Secondary | ICD-10-CM | POA: Diagnosis present

## 2014-04-19 LAB — COMPREHENSIVE METABOLIC PANEL
ALT: 24 U/L (ref 0–53)
AST: 30 U/L (ref 0–37)
Albumin: 3.8 g/dL (ref 3.5–5.2)
Alkaline Phosphatase: 66 U/L (ref 39–117)
Anion gap: 6 (ref 5–15)
BILIRUBIN TOTAL: 0.7 mg/dL (ref 0.3–1.2)
BUN: 23 mg/dL (ref 6–23)
CO2: 25 mmol/L (ref 19–32)
CREATININE: 1.16 mg/dL (ref 0.50–1.35)
Calcium: 8.8 mg/dL (ref 8.4–10.5)
Chloride: 103 mmol/L (ref 96–112)
GFR calc non Af Amer: 66 mL/min — ABNORMAL LOW (ref >90)
GFR, EST AFRICAN AMERICAN: 77 mL/min — AB (ref >90)
Glucose, Bld: 104 mg/dL — ABNORMAL HIGH (ref 70–99)
POTASSIUM: 4 mmol/L (ref 3.5–5.1)
Sodium: 134 mmol/L — ABNORMAL LOW (ref 135–145)
TOTAL PROTEIN: 6.7 g/dL (ref 6.0–8.3)

## 2014-04-19 LAB — URINALYSIS, ROUTINE W REFLEX MICROSCOPIC
BILIRUBIN URINE: NEGATIVE
Glucose, UA: NEGATIVE mg/dL
HGB URINE DIPSTICK: NEGATIVE
Ketones, ur: NEGATIVE mg/dL
LEUKOCYTES UA: NEGATIVE
Nitrite: NEGATIVE
PH: 5 (ref 5.0–8.0)
Protein, ur: NEGATIVE mg/dL
SPECIFIC GRAVITY, URINE: 1.007 (ref 1.005–1.030)
UROBILINOGEN UA: 0.2 mg/dL (ref 0.0–1.0)

## 2014-04-19 LAB — CBC WITH DIFFERENTIAL/PLATELET
Basophils Absolute: 0 10*3/uL (ref 0.0–0.1)
Basophils Relative: 0 % (ref 0–1)
Eosinophils Absolute: 0.1 10*3/uL (ref 0.0–0.7)
Eosinophils Relative: 2 % (ref 0–5)
HEMATOCRIT: 33.4 % — AB (ref 39.0–52.0)
HEMOGLOBIN: 11 g/dL — AB (ref 13.0–17.0)
LYMPHS ABS: 0.7 10*3/uL (ref 0.7–4.0)
Lymphocytes Relative: 10 % — ABNORMAL LOW (ref 12–46)
MCH: 30.4 pg (ref 26.0–34.0)
MCHC: 32.9 g/dL (ref 30.0–36.0)
MCV: 92.3 fL (ref 78.0–100.0)
MONO ABS: 1.1 10*3/uL — AB (ref 0.1–1.0)
Monocytes Relative: 15 % — ABNORMAL HIGH (ref 3–12)
NEUTROS ABS: 5.5 10*3/uL (ref 1.7–7.7)
NEUTROS PCT: 73 % (ref 43–77)
Platelets: 105 10*3/uL — ABNORMAL LOW (ref 150–400)
RBC: 3.62 MIL/uL — ABNORMAL LOW (ref 4.22–5.81)
RDW: 15.7 % — ABNORMAL HIGH (ref 11.5–15.5)
WBC: 7.4 10*3/uL (ref 4.0–10.5)

## 2014-04-19 LAB — I-STAT CG4 LACTIC ACID, ED: Lactic Acid, Venous: 1.78 mmol/L (ref 0.5–2.0)

## 2014-04-19 MED ORDER — ACETAMINOPHEN 500 MG PO TABS
1000.0000 mg | ORAL_TABLET | Freq: Once | ORAL | Status: AC
  Administered 2014-04-20: 1000 mg via ORAL
  Filled 2014-04-19: qty 2

## 2014-04-19 MED ORDER — SODIUM CHLORIDE 0.9 % IV BOLUS (SEPSIS)
1000.0000 mL | Freq: Once | INTRAVENOUS | Status: AC
Start: 2014-04-19 — End: 2014-04-19
  Administered 2014-04-19: 1000 mL via INTRAVENOUS

## 2014-04-19 MED ORDER — SODIUM CHLORIDE 0.9 % IV SOLN
Freq: Once | INTRAVENOUS | Status: AC
  Administered 2014-04-19: 10:00:00 via INTRAVENOUS

## 2014-04-19 MED ORDER — ZOLEDRONIC ACID 4 MG/100ML IV SOLN
4.0000 mg | Freq: Once | INTRAVENOUS | Status: AC
  Administered 2014-04-19: 4 mg via INTRAVENOUS
  Filled 2014-04-19: qty 100

## 2014-04-19 MED ORDER — ONDANSETRON HCL 8 MG PO TABS
8.0000 mg | ORAL_TABLET | Freq: Once | ORAL | Status: AC
  Administered 2014-04-19: 8 mg via ORAL

## 2014-04-19 MED ORDER — ONDANSETRON HCL 8 MG PO TABS
ORAL_TABLET | ORAL | Status: AC
  Filled 2014-04-19: qty 1

## 2014-04-19 MED ORDER — BORTEZOMIB CHEMO SQ INJECTION 3.5 MG (2.5MG/ML)
1.3000 mg/m2 | Freq: Once | INTRAMUSCULAR | Status: AC
  Administered 2014-04-19: 2.75 mg via SUBCUTANEOUS
  Filled 2014-04-19: qty 2.75

## 2014-04-19 NOTE — Patient Instructions (Signed)
[facility] Discharge Instructions for Patients Receiving Chemotherapy  Today you received the following chemotherapy agents Velcade/Zometa.   To help prevent nausea and vomiting after your treatment, we encourage you to take your nausea medication as directed.    If you develop nausea and vomiting that is not controlled by your nausea medication, call the clinic.   BELOW ARE SYMPTOMS THAT SHOULD BE REPORTED IMMEDIATELY:  *FEVER GREATER THAN 100.5 F  *CHILLS WITH OR WITHOUT FEVER  NAUSEA AND VOMITING THAT IS NOT CONTROLLED WITH YOUR NAUSEA MEDICATION  *UNUSUAL SHORTNESS OF BREATH  *UNUSUAL BRUISING OR BLEEDING  TENDERNESS IN MOUTH AND THROAT WITH OR WITHOUT PRESENCE OF ULCERS  *URINARY PROBLEMS  *BOWEL PROBLEMS  UNUSUAL RASH Items with * indicate a potential emergency and should be followed up as soon as possible.  Feel free to call the clinic you have any questions or concerns. The clinic phone number is [phone].

## 2014-04-19 NOTE — ED Notes (Addendum)
Patient states that he had chemo today at the cancer center and had a fever at home of 101.4. No other symptoms. Alert and oriented. Took two aleve before coming.

## 2014-04-19 NOTE — ED Notes (Signed)
Pt states had chemo today, when went home started having fever w/ chills, took 2 aleve tablets around 2030 for fever.

## 2014-04-19 NOTE — ED Notes (Signed)
Patient transported to X-ray 

## 2014-04-20 ENCOUNTER — Encounter (HOSPITAL_COMMUNITY): Payer: Self-pay | Admitting: *Deleted

## 2014-04-20 DIAGNOSIS — D6959 Other secondary thrombocytopenia: Secondary | ICD-10-CM | POA: Diagnosis present

## 2014-04-20 DIAGNOSIS — R509 Fever, unspecified: Secondary | ICD-10-CM

## 2014-04-20 DIAGNOSIS — T451X5A Adverse effect of antineoplastic and immunosuppressive drugs, initial encounter: Secondary | ICD-10-CM | POA: Diagnosis present

## 2014-04-20 DIAGNOSIS — C9000 Multiple myeloma not having achieved remission: Secondary | ICD-10-CM

## 2014-04-20 DIAGNOSIS — Z794 Long term (current) use of insulin: Secondary | ICD-10-CM | POA: Diagnosis not present

## 2014-04-20 DIAGNOSIS — E785 Hyperlipidemia, unspecified: Secondary | ICD-10-CM | POA: Diagnosis present

## 2014-04-20 DIAGNOSIS — E119 Type 2 diabetes mellitus without complications: Secondary | ICD-10-CM

## 2014-04-20 DIAGNOSIS — R6510 Systemic inflammatory response syndrome (SIRS) of non-infectious origin without acute organ dysfunction: Secondary | ICD-10-CM | POA: Diagnosis present

## 2014-04-20 DIAGNOSIS — Z87891 Personal history of nicotine dependence: Secondary | ICD-10-CM | POA: Diagnosis not present

## 2014-04-20 DIAGNOSIS — D500 Iron deficiency anemia secondary to blood loss (chronic): Secondary | ICD-10-CM | POA: Diagnosis present

## 2014-04-20 DIAGNOSIS — A419 Sepsis, unspecified organism: Secondary | ICD-10-CM

## 2014-04-20 DIAGNOSIS — Z7982 Long term (current) use of aspirin: Secondary | ICD-10-CM | POA: Diagnosis not present

## 2014-04-20 DIAGNOSIS — K649 Unspecified hemorrhoids: Secondary | ICD-10-CM | POA: Diagnosis present

## 2014-04-20 DIAGNOSIS — Z79899 Other long term (current) drug therapy: Secondary | ICD-10-CM | POA: Diagnosis not present

## 2014-04-20 DIAGNOSIS — K921 Melena: Secondary | ICD-10-CM | POA: Diagnosis present

## 2014-04-20 DIAGNOSIS — K219 Gastro-esophageal reflux disease without esophagitis: Secondary | ICD-10-CM | POA: Diagnosis present

## 2014-04-20 DIAGNOSIS — C6290 Malignant neoplasm of unspecified testis, unspecified whether descended or undescended: Secondary | ICD-10-CM | POA: Diagnosis present

## 2014-04-20 DIAGNOSIS — A047 Enterocolitis due to Clostridium difficile: Secondary | ICD-10-CM | POA: Diagnosis present

## 2014-04-20 LAB — CBC
HCT: 33.7 % — ABNORMAL LOW (ref 39.0–52.0)
HEMOGLOBIN: 11 g/dL — AB (ref 13.0–17.0)
MCH: 30.1 pg (ref 26.0–34.0)
MCHC: 32.6 g/dL (ref 30.0–36.0)
MCV: 92.3 fL (ref 78.0–100.0)
Platelets: 96 10*3/uL — ABNORMAL LOW (ref 150–400)
RBC: 3.65 MIL/uL — AB (ref 4.22–5.81)
RDW: 15.6 % — ABNORMAL HIGH (ref 11.5–15.5)
WBC: 6.8 10*3/uL (ref 4.0–10.5)

## 2014-04-20 LAB — URINE CULTURE
CULTURE: NO GROWTH
Colony Count: NO GROWTH
Special Requests: NORMAL

## 2014-04-20 LAB — BASIC METABOLIC PANEL
ANION GAP: 7 (ref 5–15)
BUN: 23 mg/dL (ref 6–23)
CHLORIDE: 102 mmol/L (ref 96–112)
CO2: 24 mmol/L (ref 19–32)
CREATININE: 1.15 mg/dL (ref 0.50–1.35)
Calcium: 8.7 mg/dL (ref 8.4–10.5)
GFR calc Af Amer: 78 mL/min — ABNORMAL LOW (ref >90)
GFR, EST NON AFRICAN AMERICAN: 67 mL/min — AB (ref >90)
Glucose, Bld: 94 mg/dL (ref 70–99)
POTASSIUM: 4 mmol/L (ref 3.5–5.1)
Sodium: 133 mmol/L — ABNORMAL LOW (ref 135–145)

## 2014-04-20 LAB — CORTISOL-AM, BLOOD: Cortisol - AM: 8.3 ug/dL (ref 4.3–22.4)

## 2014-04-20 LAB — FIBRINOGEN: FIBRINOGEN: 424 mg/dL (ref 204–475)

## 2014-04-20 LAB — GLUCOSE, CAPILLARY
GLUCOSE-CAPILLARY: 78 mg/dL (ref 70–99)
GLUCOSE-CAPILLARY: 79 mg/dL (ref 70–99)
Glucose-Capillary: 76 mg/dL (ref 70–99)
Glucose-Capillary: 115 mg/dL — ABNORMAL HIGH (ref 70–99)
Glucose-Capillary: 115 mg/dL — ABNORMAL HIGH (ref 70–99)

## 2014-04-20 LAB — INFLUENZA PANEL BY PCR (TYPE A & B)
H1N1FLUPCR: NOT DETECTED
INFLAPCR: NEGATIVE
Influenza B By PCR: NEGATIVE

## 2014-04-20 LAB — APTT: APTT: 29 s (ref 24–37)

## 2014-04-20 LAB — PROTIME-INR
INR: 1.04 (ref 0.00–1.49)
Prothrombin Time: 13.7 seconds (ref 11.6–15.2)

## 2014-04-20 MED ORDER — ACETAMINOPHEN 650 MG RE SUPP: 650.0000 mg | Freq: Four times a day (QID) | RECTAL | Status: DC | PRN

## 2014-04-20 MED ORDER — ONDANSETRON HCL 4 MG PO TABS: 4.0000 mg | ORAL_TABLET | Freq: Four times a day (QID) | ORAL | Status: DC | PRN

## 2014-04-20 MED ORDER — FAMOTIDINE 40 MG PO TABS
40.0000 mg | ORAL_TABLET | Freq: Every day | ORAL | Status: DC
  Administered 2014-04-20 – 2014-04-21 (×2): 40 mg via ORAL
  Filled 2014-04-20 (×3): qty 1

## 2014-04-20 MED ORDER — ACETAMINOPHEN 325 MG PO TABS: 650.0000 mg | ORAL_TABLET | Freq: Four times a day (QID) | ORAL | Status: DC | PRN

## 2014-04-20 MED ORDER — ONDANSETRON HCL 4 MG/2ML IJ SOLN: 4.0000 mg | Freq: Four times a day (QID) | INTRAMUSCULAR | Status: DC | PRN

## 2014-04-20 MED ORDER — PANTOPRAZOLE SODIUM 40 MG PO TBEC
40.0000 mg | DELAYED_RELEASE_TABLET | Freq: Every day | ORAL | Status: DC
  Administered 2014-04-20 – 2014-04-22 (×3): 40 mg via ORAL
  Filled 2014-04-20 (×3): qty 1

## 2014-04-20 MED ORDER — VANCOMYCIN HCL IN DEXTROSE 1-5 GM/200ML-% IV SOLN
1000.0000 mg | Freq: Two times a day (BID) | INTRAVENOUS | Status: DC
  Administered 2014-04-20 – 2014-04-21 (×3): 1000 mg via INTRAVENOUS
  Filled 2014-04-20 (×3): qty 200

## 2014-04-20 MED ORDER — ACYCLOVIR 400 MG PO TABS
400.0000 mg | ORAL_TABLET | Freq: Every day | ORAL | Status: DC
  Administered 2014-04-20 – 2014-04-22 (×3): 400 mg via ORAL
  Filled 2014-04-20 (×3): qty 1

## 2014-04-20 MED ORDER — FOLIC ACID 1 MG PO TABS
1.0000 mg | ORAL_TABLET | Freq: Every day | ORAL | Status: DC
  Administered 2014-04-20 – 2014-04-22 (×3): 1 mg via ORAL
  Filled 2014-04-20 (×3): qty 1

## 2014-04-20 MED ORDER — SODIUM CHLORIDE 0.9 % IV SOLN
INTRAVENOUS | Status: DC
  Administered 2014-04-20 – 2014-04-22 (×4): via INTRAVENOUS
  Filled 2014-04-20 (×5): qty 1000

## 2014-04-20 MED ORDER — ASPIRIN 81 MG PO CHEW
81.0000 mg | CHEWABLE_TABLET | Freq: Every day | ORAL | Status: DC
  Administered 2014-04-20 – 2014-04-22 (×3): 81 mg via ORAL
  Filled 2014-04-20 (×4): qty 1

## 2014-04-20 MED ORDER — INSULIN GLARGINE 100 UNIT/ML ~~LOC~~ SOLN
5.0000 [IU] | Freq: Every day | SUBCUTANEOUS | Status: DC
  Administered 2014-04-21: 5 [IU] via SUBCUTANEOUS
  Filled 2014-04-20 (×2): qty 0.05

## 2014-04-20 MED ORDER — DEXTROSE 5 % IV SOLN
1.0000 g | Freq: Three times a day (TID) | INTRAVENOUS | Status: DC
  Administered 2014-04-20 – 2014-04-21 (×3): 1 g via INTRAVENOUS
  Filled 2014-04-20 (×4): qty 1

## 2014-04-20 MED ORDER — MAGNESIUM OXIDE 400 (241.3 MG) MG PO TABS
400.0000 mg | ORAL_TABLET | Freq: Every day | ORAL | Status: DC
  Administered 2014-04-20 – 2014-04-22 (×3): 400 mg via ORAL
  Filled 2014-04-20 (×3): qty 1

## 2014-04-20 MED ORDER — SODIUM CHLORIDE 0.9 % IJ SOLN: 3.0000 mL | Freq: Two times a day (BID) | INTRAMUSCULAR | Status: DC

## 2014-04-20 MED ORDER — HYDROCORTISONE 2.5 % RE CREA
TOPICAL_CREAM | Freq: Two times a day (BID) | RECTAL | Status: DC
  Administered 2014-04-20 (×2): via RECTAL
  Administered 2014-04-20: 1 via RECTAL
  Administered 2014-04-21 – 2014-04-22 (×3): via RECTAL
  Filled 2014-04-20: qty 28.35

## 2014-04-20 MED ORDER — VITAMIN D 1000 UNITS PO TABS
2000.0000 [IU] | ORAL_TABLET | Freq: Every day | ORAL | Status: DC
  Administered 2014-04-20 – 2014-04-22 (×3): 2000 [IU] via ORAL
  Filled 2014-04-20 (×3): qty 2

## 2014-04-20 MED ORDER — PRAVASTATIN SODIUM 40 MG PO TABS
40.0000 mg | ORAL_TABLET | Freq: Every day | ORAL | Status: DC
  Administered 2014-04-20 – 2014-04-21 (×2): 40 mg via ORAL
  Filled 2014-04-20 (×3): qty 1

## 2014-04-20 MED ORDER — POLYETHYLENE GLYCOL 3350 17 G PO PACK
17.0000 g | PACK | Freq: Every day | ORAL | Status: DC
  Administered 2014-04-20 – 2014-04-21 (×2): 17 g via ORAL
  Filled 2014-04-20 (×2): qty 1

## 2014-04-20 MED ORDER — CEFEPIME HCL 2 G IJ SOLR
2.0000 g | Freq: Once | INTRAMUSCULAR | Status: AC
  Administered 2014-04-20: 2 g via INTRAVENOUS
  Filled 2014-04-20: qty 2

## 2014-04-20 MED ORDER — GUAIFENESIN 100 MG/5ML PO SOLN: 10.0000 mL | ORAL | Status: DC | PRN

## 2014-04-20 MED ORDER — INSULIN ASPART 100 UNIT/ML ~~LOC~~ SOLN: 0.0000 [IU] | Freq: Three times a day (TID) | SUBCUTANEOUS | Status: DC

## 2014-04-20 MED ORDER — ADULT MULTIVITAMIN W/MINERALS CH
1.0000 | ORAL_TABLET | Freq: Every day | ORAL | Status: DC
  Administered 2014-04-20 – 2014-04-22 (×3): 1 via ORAL
  Filled 2014-04-20 (×3): qty 1

## 2014-04-20 NOTE — Care Management Note (Signed)
    Page 1 of 1   04/20/2014     1:18:46 PM CARE MANAGEMENT NOTE 04/20/2014  Patient:  Tyler Willis, Tyler Willis   Account Number:  [id number]  Date Initiated:  04/20/2014  Documentation initiated by:  Dessa Phi  Subjective/Objective Assessment:   64 y/o m admitted w/SIRS.Readmit 12/31-1/4-Sepsis,pna.WU:[id number] Myeloma.     Action/Plan:   From home.   Anticipated DC Date:  04/24/2014   Anticipated DC Plan:  [place]  CM consult      Choice offered to / List presented to:             Status of service:  In process, will continue to follow Medicare Important Message given?   (If response is "NO", the following Medicare IM given date fields will be blank) Date Medicare IM given:   Medicare IM given by:   Date Additional Medicare IM given:   Additional Medicare IM given by:    Discharge Disposition:    Per UR Regulation:  Reviewed for med. necessity/level of care/duration of stay  If discussed at [place] of Stay Meetings, dates discussed:    Comments:  04/20/14 [place] [phone] Monitor progress for d/c needs.

## 2014-04-20 NOTE — Progress Notes (Signed)
Progress Note   Tyler Willis ZOX:[id number] DOB: 1951-03-18 DOA: 04/19/2014 PCP: Gennette Pac, MD   Brief Narrative:   Tyler Willis is an 64 y.o. male with a PMH of multiple myeloma, diabetes mellitus, testicular seminoma, GERD, hyperlipidemia who was admitted 04/19/14 with a one-day history of fever up to 101.18F. The patient has been receiving Cytoxan and Velcade for his myeloma under the guidance of Dr. Alvy Bimler. The patient's last dose of Velcade was on the day of admission. His last dose of Cytoxan was 04/16/2014. The patient had 2 recent admissions to the hospital, from 03/22/2014 through 03/26/2014 and from 03/08/2014 through 03/11/2014. For these admissions, the patient had a similar presentation such that the patient presented with SIRS criteria. After rehydration, the patient developed pulmonary infiltrates and was treated for pneumonia. On his most recent admission from which he was discharged on 03/26/2014, the patient also suffered Clostridium difficile colitis.  In the emergency department, the patient had a temperature of 99.18F with soft blood pressure of 92/61. Oxygen saturation was 94-97% on room air. BMP was essentially unremarkable except for serum creatinine 1.16. Hepatic enzymes were unremarkable. CBC showed platelets 105,000, WBC 7.4, hemoglobin 11.0. Lactic acid was 1.78. Urinalysis was negative for pyuria. Chest x-ray showed bibasilar atelectasis without consolidation.  Assessment/Plan:   Principal Problem:   SIRS (systemic inflammatory response syndrome)/fever  Continue empiric vancomycin/cefepime given patient's immunosuppressed status with recent chemotherapy for his myeloma.  Continue IV fluids for soft blood pressure.  Follow-up influenza panel and a.m. cortisol.  Active Problems:   Multiple myeloma  Dr. Alvy Bimler notified of the patient's admission.    Diabetes mellitus without complication  Hemoglobin A1c 6.4% on 03/23/14. Continue to hold  metformin.  Currently being managed with Lantus 5 units daily and insulin sensitive SSI 3 times a day.  CBG 78-79, continue current management.    Thrombocytopenia   Likely from recent chemotherapy. Monitor. No current indication for transfusion.    Hematochezia   Continue Anusol suppositories. Continue to monitor hemoglobin. Consider holding aspirin if persistent.    HLD (hyperlipidemia)  Continue Pravachol.    GERD (gastroesophageal reflux disease)  Continue PPI.    DVT Prophylaxis  SCDs ordered.  Code Status: Full. Family Communication: Wife at bedside. Disposition Plan: Home when stable.   IV Access:    Peripheral IV   Procedures and diagnostic studies:   Dg Chest 2 View 04/19/2014: Stable chest x-ray without evidence of acute cardiopulmonary process.    Medical Consultants:    None.  Anti-Infectives:    Cefepime 04/19/14--->  Vancomycin 04/19/14--->  Subjective:   Tyler Willis had some fever/chills in the night.  No myalgias.  Occasional cough.  No dyspnea.  No changes in his bowel habits.  No nausea or vomiting.  Objective:    Filed Vitals:   04/20/14 0007 04/20/14 0114 04/20/14 0202 04/20/14 0552  BP: _0 108/60  Pulse: 91 71 66 74  Temp: 99.2 F (37.3 C) 99.2 F (37.3 C) 98.1 F (36.7 C) 98.3 F (36.8 C)  TempSrc: Oral Oral Oral Oral  Resp: _1 Height:   6' (1.829 m)   Weight:   88.406 kg (194 lb 14.4 oz)   SpO2: 94% 97% 96% 100%   No intake or output data in the 24 hours ending 04/20/14 0804  Exam: Gen:  NAD Cardiovascular:  RRR, No M/R/G Respiratory:  Lungs CTAB Gastrointestinal:  Abdomen soft, NT/ND, + BS Extremities:  No  C/E/C   Data Reviewed:    Labs: Basic Metabolic Panel:  Recent Labs Lab 04/16/14 0934 04/19/14 2151 04/20/14 0313  NA 139 134* 133*  K 3.9 4.0 4.0  CL  --  103 102  CO2 _0 GLUCOSE 115 104* 94  BUN 12._1 CREATININE 1.0 1.16 1.15  CALCIUM 8.8 8.8 8.7    GFR Estimated Creatinine Clearance: 74 mL/min (by C-G formula based on Cr of 1.15). Liver Function Tests:  Recent Labs Lab 04/16/14 0934 04/19/14 2151  AST 34 30  ALT 38 24  ALKPHOS 76 66  BILITOT 0.81 0.7  PROT 7.6 6.7  ALBUMIN 3.9 3.8   Coagulation profile  Recent Labs Lab 04/20/14 0313  INR 1.04    CBC:  Recent Labs Lab 04/16/14 0934 04/19/14 2151 04/20/14 0313  WBC 5.1 7.4 6.8  NEUTROABS 1.8 5.5  --   HGB 13.0 11.0* 11.0*  HCT 40.5 33.4* 33.7*  MCV 92.2 92.3 92.3  PLT 168 105* 96*   CBG:  Recent Labs Lab 04/20/14 0244 04/20/14 0728  GLUCAP 78 79   Sepsis Labs:  Recent Labs Lab 04/16/14 0934 04/19/14 2151 04/19/14 2152 04/20/14 0313  WBC 5.1 7.4  --  6.8  LATICACIDVEN  --   --  1.78  --    Microbiology No results found for this or any previous visit (from the past 240 hour(s)).   Medications:   . acyclovir  400 mg Oral Daily  . aspirin  81 mg Oral Daily  . ceFEPime (MAXIPIME) IV  1 g Intravenous Q8H  . cholecalciferol  2,000 Units Oral Daily  . famotidine  40 mg Oral Daily  . folic acid  1 mg Oral Daily  . hydrocortisone   Rectal BID  . insulin aspart  0-9 Units Subcutaneous TID WC  . [START ON 04/21/2014] insulin glargine  5 Units Subcutaneous QHS  . magnesium oxide  400 mg Oral Daily  . multivitamin with minerals  1 tablet Oral Daily  . pantoprazole  40 mg Oral Daily  . pravastatin  40 mg Oral Daily  . sodium chloride  3 mL Intravenous Q12H  . vancomycin  1,000 mg Intravenous Q12H   Continuous Infusions: . sodium chloride 0.9 % 1,000 mL with potassium chloride 20 mEq infusion 75 mL/hr at 04/20/14 0329    Time spent: 25 minutes.   LOS: 1 day   Tyler Willis  [facility] Pager [phone]. If unable to reach me by pager, please call my cell phone at [phone].  *Please refer to [email or web address], password [email or web address] to get updated schedule on who will round on this patient, as hospitalists switch teams weekly. If 7PM-7AM, please  contact night-coverage at [email or web address], password [email or web address] for any overnight needs.  04/20/2014, 8:04 AM

## 2014-04-20 NOTE — Progress Notes (Signed)
Per spouse, patient has had 5 diarrhea episodes within 24 hours. Contact precautions for C-Diff was implemented by the RN and a hat was placed in the commode to catch the next stool.  Patient aware of precautions, procedures and the reason for implementation.

## 2014-04-20 NOTE — H&P (Signed)
History and Physical  Tyler Willis QQP:[id number] DOB: April 13, 1950 DOA: 04/19/2014   PCP: Gennette Pac, MD   Chief Complaint: Fever and chills  HPI:  64 year old male with a history of multiple myeloma, diabetes mellitus, testicular seminoma, GERD, hyperlipidemia presents with one-day history of fever up to 101.41F. The patient has been receiving Cytoxan and Velcade for his myeloma under the guidance of Dr. Alvy Bimler. The patient's last dose of Velcade was on the day of admission. His last dose of Cytoxan was 04/16/2014. The patient had 2 recent admissions to the hospital, from 03/22/2014 through 03/26/2014 and from 03/08/2014 through 03/11/2014. For these admissions, the patient had a similar presentation such that the patient presented with SIRS criteria. After rehydration, the patient developed pulmonary infiltrates and was treated for pneumonia. On his most recent admission from which she was discharged on 03/26/2014, the patient also suffered Clostridium difficile colitis. He finished all his antibiotics. The patient presently denies any shortness of breath but complains of a nonproductive cough. He denies any hemoptysis. He denies any chest discomfort, nausea, vomiting, diarrhea, abdominal pain, dysuria, hematuria, headache, rashes. The patient states that he has been having some hematochezia but he is to be stressed this to his hemorrhoids. He has not been using his Anusol.  In the emergency department, the patient had a temperature of 99.41F with soft blood pressure of 92/61. Oxygen desaturation was 94-97% on room air. BMP was essentially unremarkable except for serum creatinine 1.16. Hepatic enzymes were unremarkable. CBC showed platelets 105,000, WBC 7.4, hemoglobin 11.0. Lactic acid was 1.78. Urinalysis was negative for pyuria. Chest x-ray showed bibasilar atelectasis without consolidation. Assessment/Plan: SIRS -Start empiric vancomycin and cefepime given the patient's  immunosuppressed status and presently receiving chemotherapy for his myeloma -IV fluids--patient's blood pressure is quite soft. The patient normally has SBP 120-130 -Repeat chest x-ray after the patient was adequately hydrated -Influenza PCR -am cortisol Thrombocytopenia -Likely multifactorial due to the patient's chemotherapy, acute illness, medications -Check PT/INR/PTT/fibrinogen Diabetes mellitus type 2 -Continue home dose Lantus -NovoLog sliding scale -03/23/14 hemoglobin A1c 6.4 -hold metformin Hematochezia -Hemoglobin is stable at baseline -Restart Anusol suppositories -Monitor hemoglobin Myeloma -Placed on Dr. Alvy Bimler consult list       Past Medical History  Diagnosis Date  . Diabetes mellitus without complication   . GERD (gastroesophageal reflux disease)   . 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 Multiple myeloma 02/19/2014   History reviewed. No pertinent past surgical history. Social History:  reports that he has quit smoking. His smoking use included Pipe. He has never used smokeless tobacco. He reports that he does not drink alcohol or use illicit drugs.   Family History  Problem Relation Age of Onset  . Diabetes Father   . Cancer Maternal Uncle     lung cancer  . Cancer Cousin     myeloma     No Known Allergies    Prior to Admission medications   Medication Sig Start Date End Date Taking? Authorizing Provider  acetaminophen (TYLENOL) 325 MG tablet Take 2 tablets (650 mg total) by mouth every 6 (six) hours as needed for mild pain (or Fever >/= 101). 03/26/14  Yes Robbie Lis, MD  aspirin 81 MG chewable tablet Chew 81 mg by mouth daily.   Yes Historical Provider, MD  CALCIUM PO Take 1,000 mg by mouth daily.   Yes Historical Provider, MD  Cholecalciferol (VITAMIN D3) 2000 UNITS TABS Take 1 tablet by mouth daily.  Yes Historical Provider, MD  folic acid (FOLVITE) 1 MG tablet Take 1 tablet (1 mg total) by mouth daily. 03/26/14  Yes Robbie Lis, MD    guaiFENesin (ROBITUSSIN) 100 MG/5ML SOLN Take 10 mLs (200 mg total) by mouth every 4 (four) hours as needed. 03/26/14  Yes Robbie Lis, MD  hydrocortisone (ANUSOL-HC) 2.5 % rectal cream Place rectally 2 (two) times daily. 03/26/14  Yes Robbie Lis, MD  insulin glargine (LANTUS) 100 UNIT/ML injection Inject 5-6 Units into the skin at bedtime. Take 5 units at Bedtime Everyday Except on Mondays Take 6 units.   Yes Historical Provider, MD  Magnesium 400 MG TABS Take 400 mg by mouth daily.   Yes Historical Provider, MD  metFORMIN (GLUCOPHAGE) 500 MG tablet Take 500 mg by mouth 2 (two) times daily with a meal.   Yes Historical Provider, MD  Multiple Vitamin (MULTIVITAMIN) tablet Take 1 tablet by mouth daily.   Yes Historical Provider, MD  naproxen sodium (ANAPROX) 220 MG tablet Take 440 mg by mouth daily as needed (pain).    Yes Historical Provider, MD  omeprazole (PRILOSEC) 20 MG capsule Take 20 mg by mouth daily.   Yes Historical Provider, MD  polyethylene glycol (MIRALAX / GLYCOLAX) packet Take 17 g by mouth daily.   Yes Historical Provider, MD  pravastatin (PRAVACHOL) 40 MG tablet Take 40 mg by mouth daily. Daily 11/20/13  Yes Historical Provider, MD  acyclovir (ZOVIRAX) 400 MG tablet Take 1 tablet (400 mg total) by mouth daily. Patient not taking: Reported on 04/19/2014 02/22/14   Heath Lark, MD  benzonatate (TESSALON) 100 MG capsule Take 1 capsule (100 mg total) by mouth 3 (three) times daily as needed for cough. 03/26/14   Robbie Lis, MD  cyclophosphamide (CYTOXAN) 50 MG tablet Take 6 tablets on days 1,8,15 of chemo 1hr before or 2hr after meals every 21d. Patient taking differently: Take 200 mg by mouth once a week. Take 4 Tablets (200 mg) By Mouth Every Monday For 3 Weeks Except For Week 4 (Take Off). 02/22/14   Heath Lark, MD  dexamethasone (DECADRON) 4 MG tablet Take 10 pills every Monday with breakfast 02/22/14   Heath Lark, MD  famotidine (PEPCID) 40 MG tablet Take 1 tablet (40 mg total) by  mouth daily. Patient not taking: Reported on 04/19/2014 03/26/14   Robbie Lis, MD  levofloxacin (LEVAQUIN) 750 MG tablet Take 1 tablet (750 mg total) by mouth daily. Patient not taking: Reported on 04/19/2014 03/26/14   Robbie Lis, MD  metroNIDAZOLE (FLAGYL) 500 MG tablet Take 1 tablet (500 mg total) by mouth every 8 (eight) hours. Patient not taking: Reported on 04/19/2014 03/26/14   Robbie Lis, MD  prochlorperazine (COMPAZINE) 10 MG tablet Take 1 tablet (10 mg total) by mouth every 6 (six) hours as needed (Nausea or vomiting). 02/22/14   Heath Lark, MD  Pseudoephedrine-APAP-DM (DAYQUIL PO) Take 30 mLs by mouth daily as needed (cold symptoms).     Historical Provider, MD    Review of Systems:  Constitutional:  No weight loss, night sweats Head&Eyes: No headache.  No vision loss.  No eye pain or scotoma ENT:  No Difficulty swallowing,Tooth/dental problems,Sore throat,   Cardio-vascular:  No chest pain, Orthopnea, PND, swelling in lower extremities,  dizziness, palpitations  GI:  No  abdominal pain, nausea, vomiting, diarrhea, loss of appetite,  melena, heartburn, indigestion, Resp:  No shortness of breath with exertion or at rest.  No coughing up of blood .  No wheezing.No chest wall deformity  Skin:  no rash or lesions.  GU:  no dysuria, change in color of urine, no urgency or frequency. No flank pain.  Musculoskeletal:  No joint pain or swelling. No decreased range of motion.   Psych:  No change in mood or affect. No depression or anxiety. Neurologic: No headache, no dysesthesia, no focal weakness, no vision loss. No syncope  Physical Exam: Filed Vitals:   04/19/14 2103 04/19/14 2235 04/19/14 2315 04/20/14 0007  BP: 115/60 96/57 92/61  94/56  Pulse: 100 80 89 91  Temp: 98.8 F (37.1 C)  99.4 F (37.4 C) 99.2 F (37.3 C)  TempSrc: Oral  Oral Oral  Resp:  18 18 18   SpO2: 97% 92% 94% 94%   General:  A&O x 3, NAD, nontoxic, pleasant/cooperative Head/Eye: No conjunctival  hemorrhage, no icterus, [place]/AT, No nystagmus ENT:  No icterus,  No thrush, good dentition, no pharyngeal exudate Neck:  No masses, no lymphadenpathy, no meningismus CV:  RRR, no rub, no gallop, no S3 Lung:  Bibasilar crackles. No wheezing. Good air movement  Abdomen: soft/NT, +BS, nondistended, no peritoneal signs Ext: No cyanosis, No rashes, No petechiae, No lymphangitis, No edema Neuro: CNII-XII intact, strength 4/5 in bilateral upper and lower extremities, no dysmetria  Labs on Admission:  Basic Metabolic Panel:  Recent Labs Lab 04/16/14 0934 04/19/14 2151  NA 139 134*  K 3.9 4.0  CL  --  103  CO2 24 25  GLUCOSE 115 104*  BUN 12.2 23  CREATININE 1.0 1.16  CALCIUM 8.8 8.8   Liver Function Tests:  Recent Labs Lab 04/16/14 0934 04/19/14 2151  AST 34 30  ALT 38 24  ALKPHOS 76 66  BILITOT 0.81 0.7  PROT 7.6 6.7  ALBUMIN 3.9 3.8   No results for input(s): LIPASE, AMYLASE in the last 168 hours. No results for input(s): AMMONIA in the last 168 hours. CBC:  Recent Labs Lab 04/16/14 0934 04/19/14 2151  WBC 5.1 7.4  NEUTROABS 1.8 5.5  HGB 13.0 11.0*  HCT 40.5 33.4*  MCV 92.2 92.3  PLT 168 105*   Cardiac Enzymes: No results for input(s): CKTOTAL, CKMB, CKMBINDEX, TROPONINI in the last 168 hours. BNP: Invalid input(s): POCBNP CBG: No results for input(s): GLUCAP in the last 168 hours.  Radiological Exams on Admission: Dg Chest 2 View  04/19/2014   CLINICAL DATA:  64 year old male with sudden onset of fever. Chemotherapy earlier today.  EXAM: CHEST  2 VIEW  COMPARISON:  Prior chest x-ray 03/22/2014  FINDINGS: Stable appearance of the chest with bibasilar atelectasis versus scarring. No new focal airspace consolidation. Cardiac and mediastinal contours remain within normal limits. No pleural effusion or pneumothorax. No acute osseous abnormality.  IMPRESSION: Stable chest x-ray without evidence of acute cardiopulmonary process.   Electronically Signed   By: Jacqulynn Cadet M.D.   On: 04/19/2014 22:20        Time spent:60 minutes Code Status:  FULL Family Communication:   Wife updated at bedside   Diem Dicocco, DO  [facility] Pager [phone]  If 7PM-7AM, please contact night-coverage [email or web address] Password [email or web address] 04/20/2014, 1:11 AM

## 2014-04-20 NOTE — Progress Notes (Signed)
Pt stated he has had 5 bowel movements today. Will place on enteric isolation and obtain stool specimen.  Tyler Willis A

## 2014-04-20 NOTE — ED Provider Notes (Signed)
CSN: [id number]     Arrival date & time 04/19/14  2052 History   First MD Initiated Contact with Patient 04/19/14 2059     Chief Complaint  Patient presents with  . Chemo Card   . Fever   HPI  Patient is a 64 year old male with past medical history of multiple myeloma currently undergoing chemotherapy with last treatment today who presents emergency room for evaluation of fever. Patient states that he completed chemotherapy today and went home and became very fatigued and tired and slightly sweaty. He is having, and cold chills. His temperature was 101 at home. He took 2 Aleve prior to coming in and had good relief of his pain. He admits to some coughing that is dry. He has had no other symptoms that he is aware of. He states that this has been happening each time after chemotherapy on Thursdays. It seems to be a pattern. He has been admitted for the same in the past.  Past Medical History  Diagnosis Date  . Diabetes mellitus without complication   . GERD (gastroesophageal reflux disease)   . 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 Multiple myeloma 02/19/2014   History reviewed. No pertinent past surgical history. Family History  Problem Relation Age of Onset  . Diabetes Father   . Cancer Maternal Uncle     lung cancer  . Cancer Cousin     myeloma   History  Substance Use Topics  . Smoking status: Former Smoker    Types: Pipe  . Smokeless tobacco: Never Used  . Alcohol Use: No    Review of Systems  Constitutional: Positive for fever, chills and fatigue.  Respiratory: Positive for cough. Negative for chest tightness and shortness of breath.   Cardiovascular: Negative for chest pain, palpitations and leg swelling.  Gastrointestinal: Negative for nausea, vomiting, abdominal pain, diarrhea and constipation.  Genitourinary: Negative for dysuria, urgency, frequency, hematuria and difficulty urinating.  Neurological: Negative for dizziness, syncope and light-headedness.  All other systems  reviewed and are negative.     Allergies  Review of patient's allergies indicates no known allergies.  Home Medications   Prior to Admission medications   Medication Sig Start Date End Date Taking? Authorizing Provider  acetaminophen (TYLENOL) 325 MG tablet Take 2 tablets (650 mg total) by mouth every 6 (six) hours as needed for mild pain (or Fever >/= 101). 03/26/14  Yes Robbie Lis, MD  aspirin 81 MG chewable tablet Chew 81 mg by mouth daily.   Yes Historical Provider, MD  CALCIUM PO Take 1,000 mg by mouth daily.   Yes Historical Provider, MD  Cholecalciferol (VITAMIN D3) 2000 UNITS TABS Take 1 tablet by mouth daily.    Yes Historical Provider, MD  folic acid (FOLVITE) 1 MG tablet Take 1 tablet (1 mg total) by mouth daily. 03/26/14  Yes Robbie Lis, MD  guaiFENesin (ROBITUSSIN) 100 MG/5ML SOLN Take 10 mLs (200 mg total) by mouth every 4 (four) hours as needed. 03/26/14  Yes Robbie Lis, MD  hydrocortisone (ANUSOL-HC) 2.5 % rectal cream Place rectally 2 (two) times daily. 03/26/14  Yes Robbie Lis, MD  insulin glargine (LANTUS) 100 UNIT/ML injection Inject 5-6 Units into the skin at bedtime. Take 5 units at Bedtime Everyday Except on Mondays Take 6 units.   Yes Historical Provider, MD  Magnesium 400 MG TABS Take 400 mg by mouth daily.   Yes Historical Provider, MD  metFORMIN (GLUCOPHAGE) 500 MG tablet Take 500 mg by  mouth 2 (two) times daily with a meal.   Yes Historical Provider, MD  Multiple Vitamin (MULTIVITAMIN) tablet Take 1 tablet by mouth daily.   Yes Historical Provider, MD  naproxen sodium (ANAPROX) 220 MG tablet Take 440 mg by mouth daily as needed (pain).    Yes Historical Provider, MD  omeprazole (PRILOSEC) 20 MG capsule Take 20 mg by mouth daily.   Yes Historical Provider, MD  polyethylene glycol (MIRALAX / GLYCOLAX) packet Take 17 g by mouth daily.   Yes Historical Provider, MD  pravastatin (PRAVACHOL) 40 MG tablet Take 40 mg by mouth daily. Daily 11/20/13  Yes Historical  Provider, MD  acyclovir (ZOVIRAX) 400 MG tablet Take 1 tablet (400 mg total) by mouth daily. Patient not taking: Reported on 04/19/2014 02/22/14   Heath Lark, MD  benzonatate (TESSALON) 100 MG capsule Take 1 capsule (100 mg total) by mouth 3 (three) times daily as needed for cough. 03/26/14   Robbie Lis, MD  cyclophosphamide (CYTOXAN) 50 MG tablet Take 6 tablets on days 1,8,15 of chemo 1hr before or 2hr after meals every 21d. Patient taking differently: Take 200 mg by mouth once a week. Take 4 Tablets (200 mg) By Mouth Every Monday For 3 Weeks Except For Week 4 (Take Off). 02/22/14   Heath Lark, MD  dexamethasone (DECADRON) 4 MG tablet Take 10 pills every Monday with breakfast 02/22/14   Heath Lark, MD  famotidine (PEPCID) 40 MG tablet Take 1 tablet (40 mg total) by mouth daily. Patient not taking: Reported on 04/19/2014 03/26/14   Robbie Lis, MD  levofloxacin (LEVAQUIN) 750 MG tablet Take 1 tablet (750 mg total) by mouth daily. Patient not taking: Reported on 04/19/2014 03/26/14   Robbie Lis, MD  metroNIDAZOLE (FLAGYL) 500 MG tablet Take 1 tablet (500 mg total) by mouth every 8 (eight) hours. Patient not taking: Reported on 04/19/2014 03/26/14   Robbie Lis, MD  prochlorperazine (COMPAZINE) 10 MG tablet Take 1 tablet (10 mg total) by mouth every 6 (six) hours as needed (Nausea or vomiting). 02/22/14   Heath Lark, MD  Pseudoephedrine-APAP-DM (DAYQUIL PO) Take 30 mLs by mouth daily as needed (cold symptoms).     Historical Provider, MD   BP 94/56 mmHg  Pulse 91  Temp(Src) 99.2 F (37.3 C) (Oral)  Resp 18  SpO2 94% Physical Exam  Constitutional: He is oriented to person, place, and time. He appears well-developed and well-nourished. No distress.  HENT:  Head: Normocephalic and atraumatic.  Mouth/Throat: Oropharynx is clear and moist. No oropharyngeal exudate.  Eyes: Conjunctivae and EOM are normal. Pupils are equal, round, and reactive to light. No scleral icterus.  Neck: Normal range of motion.  Neck supple. No JVD present. No thyromegaly present.  Cardiovascular: Normal rate, regular rhythm, normal heart sounds and intact distal pulses.  Exam reveals no gallop and no friction rub.   No murmur heard. Pulmonary/Chest: Effort normal and breath sounds normal. No respiratory distress. He has no wheezes. He has no rales. He exhibits no tenderness.  Abdominal: Soft. Bowel sounds are normal. He exhibits no distension and no mass. There is no tenderness. There is no rebound and no guarding.  Musculoskeletal: Normal range of motion.  Lymphadenopathy:    He has no cervical adenopathy.  Neurological: He is alert and oriented to person, place, and time. He has normal strength. No cranial nerve deficit or sensory deficit. Coordination normal.  Skin: Skin is warm and dry. He is not diaphoretic.  Psychiatric: He has  a normal mood and affect. His behavior is normal. Judgment and thought content normal.  Nursing note and vitals reviewed.   ED Course  Procedures (including critical care time) Labs Review Labs Reviewed  CBC WITH DIFFERENTIAL/PLATELET - Abnormal; Notable for the following:    RBC 3.62 (*)    Hemoglobin 11.0 (*)    HCT 33.4 (*)    RDW 15.7 (*)    Platelets 105 (*)    Lymphocytes Relative 10 (*)    Monocytes Relative 15 (*)    Monocytes Absolute 1.1 (*)    All other components within normal limits  COMPREHENSIVE METABOLIC PANEL - Abnormal; Notable for the following:    Sodium 134 (*)    Glucose, Bld 104 (*)    GFR calc non Af Amer 66 (*)    GFR calc Af Amer 77 (*)    All other components within normal limits  CULTURE, BLOOD (ROUTINE X 2)  CULTURE, BLOOD (ROUTINE X 2)  URINE CULTURE  URINALYSIS, ROUTINE W REFLEX MICROSCOPIC  I-STAT CG4 LACTIC ACID, ED    Imaging Review Dg Chest 2 View  04/19/2014   CLINICAL DATA:  64 year old male with sudden onset of fever. Chemotherapy earlier today.  EXAM: CHEST  2 VIEW  COMPARISON:  Prior chest x-ray 03/22/2014  FINDINGS: Stable  appearance of the chest with bibasilar atelectasis versus scarring. No new focal airspace consolidation. Cardiac and mediastinal contours remain within normal limits. No pleural effusion or pneumothorax. No acute osseous abnormality.  IMPRESSION: Stable chest x-ray without evidence of acute cardiopulmonary process.   Electronically Signed   By: Jacqulynn Cadet M.D.   On: 04/19/2014 22:20     EKG Interpretation None      MDM   Final diagnoses:  Fever, unspecified fever cause  Multiple myeloma   Patient is a 64 year old male who presents emergency room for evaluation of fever after chemotherapy (Cytotoxan). Patient is afebrile on arrival after taking 2 Aleve. I don't find stable since he has been here. Chest x-ray is negative. Blood cultures are pending. Urine culture is pending. Urinalysis negative for UTI. I-STAT lactic acid is negative. CBC reveals no evidence of leukopenia. Hemoglobin appears to be at baseline. CMP is unremarkable. I do not find an appreciable source of bacterial infection at this time. Patient was given 1 L normal saline bolus. Pressures have been soft and completely is likely from him being immobile and sleeping. Patient is able to ambulate to the bathroom on his own. He is not been febrile since he's been here. He is uncomfortable going home. We'll consult the hospitalist for possible observation admission. I spoken with Dr. Carles Collet will come to see the patient and admit. Patient seen by and discussed with Dr. Vanita Panda who agrees with the above workup and plan.    Cherylann Parr, PA-C 04/20/14 0031  Carmin Muskrat, MD 04/22/14 [phone]

## 2014-04-20 NOTE — Progress Notes (Signed)
ANTIBIOTIC CONSULT NOTE - INITIAL  Pharmacy Consult for Cefepime and Vancomycin Indication: Sepsis  No Known Allergies  Patient Measurements: Height: 6' (182.9 cm) Weight: 194 lb 14.4 oz (88.406 kg) IBW/kg (Calculated) : 77.6   Vital Signs: Temp: 98.1 F (36.7 C) (01/29 0202) Temp Source: Oral (01/29 0202) BP: 97/60 mmHg (01/29 0202) Pulse Rate: 66 (01/29 0202) Intake/Output from previous day:   Intake/Output from this shift:    Labs:  Recent Labs  04/19/14 2151  WBC 7.4  HGB 11.0*  PLT 105*  CREATININE 1.16   Estimated Creatinine Clearance: 73.4 mL/min (by C-G formula based on Cr of 1.16). No results for input(s): VANCOTROUGH, VANCOPEAK, VANCORANDOM, GENTTROUGH, GENTPEAK, GENTRANDOM, TOBRATROUGH, TOBRAPEAK, TOBRARND, AMIKACINPEAK, AMIKACINTROU, AMIKACIN in the last 72 hours.   Microbiology: Recent Results (from the past 720 hour(s))  Culture, blood (routine x 2)     Status: None   Collection Time: 03/22/14  6:08 PM  Result Value Ref Range Status   Specimen Description BLOOD RIGHT HAND  Final   Special Requests BOTTLES DRAWN AEROBIC AND ANAEROBIC 4ML  Final   Culture   Final    NO GROWTH 5 DAYS Performed at [facility]    Report Status 03/29/2014 FINAL  Final  Culture, blood (routine x 2)     Status: None   Collection Time: 03/22/14  6:08 PM  Result Value Ref Range Status   Specimen Description BLOOD RIGHT ANTECUBITAL  Final   Special Requests BOTTLES DRAWN AEROBIC AND ANAEROBIC 5ML  Final   Culture   Final    NO GROWTH 5 DAYS Performed at [facility]    Report Status 03/29/2014 FINAL  Final  MRSA PCR Screening     Status: None   Collection Time: 03/22/14 10:59 PM  Result Value Ref Range Status   MRSA by PCR NEGATIVE NEGATIVE Final    Comment:        The GeneXpert MRSA Assay (FDA approved for NASAL specimens only), is one component of a comprehensive MRSA colonization surveillance program. It is not intended to diagnose  MRSA infection nor to guide or monitor treatment for MRSA infections.   Clostridium Difficile by PCR     Status: Abnormal   Collection Time: 03/25/14 10:12 PM  Result Value Ref Range Status   C difficile by pcr POSITIVE (A) NEGATIVE Final    Comment: CRITICAL RESULT CALLED TO, READ BACK BY AND VERIFIED WITH: E.ALI,RN 03/26/14 0817 BY BSLADE Performed at [place] History: Past Medical History  Diagnosis Date  . Diabetes mellitus without complication   . GERD (gastroesophageal reflux disease)   . 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 Multiple myeloma 02/19/2014    Medications:  Scheduled:  . acyclovir  400 mg Oral Daily  . aspirin  81 mg Oral Daily  . ceFEPime (MAXIPIME) IV  1 g Intravenous Q8H  . ceFEPime (MAXIPIME) IV  2 g Intravenous Once  . cholecalciferol  2,000 Units Oral Daily  . famotidine  40 mg Oral Daily  . folic acid  1 mg Oral Daily  . hydrocortisone   Rectal BID  . insulin aspart  0-9 Units Subcutaneous TID WC  . [START ON 04/21/2014] insulin glargine  5 Units Subcutaneous QHS  . magnesium oxide  400 mg Oral Daily  . multivitamin with minerals  1 tablet Oral Daily  . pantoprazole  40 mg Oral Daily  . pravastatin  40 mg Oral Daily  . sodium  chloride  3 mL Intravenous Q12H  . vancomycin  1,000 mg Intravenous Q12H   Infusions:  . sodium chloride 0.9 % 1,000 mL with potassium chloride 20 mEq infusion     Assessment: 50 yoM with hx multiple myeloma admitted with temp/WBCs=7.4.  Cefepime and Vancomycin per Rx for Sepsis.   Goal of Therapy:  VT=15-20 mg/L  Plan:   Cefepime 2Gm x1 then 1Gm IV q8h   Vancomycin 1Gm IV q12h  F/u SCr/levels/cultures as needed   Lawana Pai R 04/20/2014,2:32 AM

## 2014-04-20 NOTE — Progress Notes (Signed)
Nutrition Brief Note  Patient identified on the Malnutrition Screening Tool (MST) Report  Pt with recent weight gain. Pt states his appetite is good now and PTA. Pt's wife denies any changes in PO intake. UBW is 180-185 lb.   Wt Readings from Last 15 Encounters:  04/20/14 194 lb 14.4 oz (88.406 kg)  04/09/14 187 lb 4.8 oz (84.959 kg)  03/30/14 189 lb (85.73 kg)  03/26/14 190 lb 4.1 oz (86.3 kg)  03/22/14 196 lb 9.6 oz (89.177 kg)  03/19/14 190 lb 3.2 oz (86.274 kg)  03/11/14 204 lb 9.4 oz (92.8 kg)  03/05/14 190 lb 14.4 oz (86.592 kg)  02/22/14 198 lb 3.2 oz (89.903 kg)  02/19/14 192 lb 9.6 oz (87.363 kg)  02/06/14 198 lb (89.812 kg)  02/17/12 193 lb (87.544 kg)    Body mass index is 26.43 kg/(m^2). Patient meets criteria for overweight based on current BMI.   Current diet order is CHO modified, PO intake not documented at this time. Per pt and pt's wife pt is eating well. Labs and medications reviewed.   No nutrition interventions warranted at this time. If nutrition issues arise, please consult RD.   Clayton Bibles, MS, RD, LDN Pager: [phone] After Hours Pager: [phone]

## 2014-04-21 ENCOUNTER — Encounter (HOSPITAL_COMMUNITY): Payer: Self-pay | Admitting: Internal Medicine

## 2014-04-21 DIAGNOSIS — R197 Diarrhea, unspecified: Secondary | ICD-10-CM | POA: Diagnosis not present

## 2014-04-21 DIAGNOSIS — E785 Hyperlipidemia, unspecified: Secondary | ICD-10-CM

## 2014-04-21 DIAGNOSIS — J329 Chronic sinusitis, unspecified: Secondary | ICD-10-CM | POA: Diagnosis present

## 2014-04-21 DIAGNOSIS — K219 Gastro-esophageal reflux disease without esophagitis: Secondary | ICD-10-CM

## 2014-04-21 LAB — GLUCOSE, CAPILLARY
GLUCOSE-CAPILLARY: 77 mg/dL (ref 70–99)
GLUCOSE-CAPILLARY: 104 mg/dL — AB (ref 70–99)
Glucose-Capillary: 88 mg/dL (ref 70–99)
Glucose-Capillary: 118 mg/dL — ABNORMAL HIGH (ref 70–99)

## 2014-04-21 LAB — CBC
HCT: 35.7 % — ABNORMAL LOW (ref 39.0–52.0)
Hemoglobin: 12.1 g/dL — ABNORMAL LOW (ref 13.0–17.0)
MCH: 31.2 pg (ref 26.0–34.0)
MCHC: 33.9 g/dL (ref 30.0–36.0)
MCV: 92 fL (ref 78.0–100.0)
Platelets: 97 10*3/uL — ABNORMAL LOW (ref 150–400)
RBC: 3.88 MIL/uL — ABNORMAL LOW (ref 4.22–5.81)
RDW: 15.7 % — ABNORMAL HIGH (ref 11.5–15.5)
WBC: 5.5 10*3/uL (ref 4.0–10.5)

## 2014-04-21 LAB — BASIC METABOLIC PANEL
ANION GAP: 7 (ref 5–15)
BUN: 13 mg/dL (ref 6–23)
CO2: 26 mmol/L (ref 19–32)
Calcium: 8.5 mg/dL (ref 8.4–10.5)
Chloride: 104 mmol/L (ref 96–112)
Creatinine, Ser: 1.06 mg/dL (ref 0.50–1.35)
GFR, EST AFRICAN AMERICAN: 86 mL/min — AB (ref >90)
GFR, EST NON AFRICAN AMERICAN: 74 mL/min — AB (ref >90)
GLUCOSE: 90 mg/dL (ref 70–99)
Potassium: 4.2 mmol/L (ref 3.5–5.1)
SODIUM: 137 mmol/L (ref 135–145)

## 2014-04-21 LAB — CLOSTRIDIUM DIFFICILE BY PCR: Toxigenic C. Difficile by PCR: POSITIVE — AB

## 2014-04-21 MED ORDER — SACCHAROMYCES BOULARDII 250 MG PO CAPS
250.0000 mg | ORAL_CAPSULE | Freq: Two times a day (BID) | ORAL | Status: DC
  Administered 2014-04-21 – 2014-04-22 (×3): 250 mg via ORAL
  Filled 2014-04-21 (×3): qty 1

## 2014-04-21 MED ORDER — VANCOMYCIN 50 MG/ML ORAL SOLUTION
250.0000 mg | Freq: Four times a day (QID) | ORAL | Status: DC
  Administered 2014-04-21 – 2014-04-22 (×4): 250 mg via ORAL
  Filled 2014-04-21 (×6): qty 5

## 2014-04-21 NOTE — Progress Notes (Addendum)
Progress Note   RICKIE GANGE ZSM:[id number] DOB: 1950/09/19 DOA: 04/19/2014 PCP: Gennette Pac, MD   Brief Narrative:   VIYAN ROSAMOND is an 64 y.o. male with a PMH of multiple myeloma, diabetes mellitus, testicular seminoma, GERD, hyperlipidemia who was admitted 04/19/14 with a one-day history of fever up to 101.13F. The patient has been receiving Cytoxan and Velcade for his myeloma under the guidance of Dr. Alvy Bimler. The patient's last dose of Velcade was on the day of admission. His last dose of Cytoxan was 04/16/2014. The patient had 2 recent admissions to the hospital, from 03/22/2014 through 03/26/2014 and from 03/08/2014 through 03/11/2014. For these admissions, the patient had a similar presentation such that the patient presented with SIRS criteria. After rehydration, the patient developed pulmonary infiltrates and was treated for pneumonia. On his most recent admission from which he was discharged on 03/26/2014, the patient also suffered Clostridium difficile colitis.  In the emergency department, the patient had a temperature of 99.13F with soft blood pressure of 92/61. Oxygen saturation was 94-97% on room air. BMP was essentially unremarkable except for serum creatinine 1.16. Hepatic enzymes were unremarkable. CBC showed platelets 105,000, WBC 7.4, hemoglobin 11.0. Lactic acid was 1.78. Urinalysis was negative for pyuria. Chest x-ray showed bibasilar atelectasis without consolidation.  Assessment/Plan:   Principal Problem:   SIRS (systemic inflammatory response syndrome)/fever  Initially put on empiric vancomycin/cefepime given patient's immunosuppressed status with recent chemotherapy for his myeloma. Discontinue empiric antibiotics, fever may have been from a viral illness. Chest x-ray was negative. Urine culture negative. Given new onset diarrhea, start empiric oral vancomycin.  Continue IV fluids for soft blood pressure.  Influenza panel negative, can discontinue  droplet precautions.  Active Problems:   Diarrhea/recurrent C. Difficile diarrhea  Was high risk for C. difficile given multiple hospitalizations with need for high potency antibiotics related to chronic immune suppression from chemotherapy and prior episode of C. difficile in the setting of multiple myeloma.  C. difficile PCR +.  Continue oral Vancomycin.  Start probiotics.    Multiple myeloma  Dr. Alvy Bimler notified of the patient's admission.    Diabetes mellitus without complication  Hemoglobin A1c 6.4% on 03/23/14. Continue to hold metformin.  Currently being managed with Lantus 5 units daily and insulin sensitive SSI 3 times a day.  CBG 76-115, continue current management.    Thrombocytopenia   Likely from recent chemotherapy. Monitor. No current indication for transfusion.    Hematochezia   Continue Anusol suppositories. Hemoglobin stable and actually up today. Consider holding aspirin if persistent.    HLD (hyperlipidemia)  Continue Pravachol.    GERD (gastroesophageal reflux disease)  Continue PPI.    DVT Prophylaxis  SCDs ordered.  Code Status: Full. Family Communication: Wife at bedside. Disposition Plan: Home when stable.   IV Access:    Peripheral IV   Procedures and diagnostic studies:   Dg Chest 2 View 04/19/2014: Stable chest x-ray without evidence of acute cardiopulmonary process.    Medical Consultants:    None.  Anti-Infectives:    Cefepime 04/19/14--->  Vancomycin 04/19/14--->  Subjective:   Charlesetta Garibaldi had 5 loose stools yesterday, none today.  Reported stools were bloody.  No nausea or vomiting.  No dyspnea.  Occasional cough.    Objective:    Filed Vitals:   04/20/14 1430 04/20/14 1500 04/20/14 2048 04/21/14 0524  BP: 106/55  126/80 119/74  Pulse: 67  74 81  Temp: 98.5 F (36.9 C) 98.2 F (36.8 C)  98.2 F (36.8 C) 98.3 F (36.8 C)  TempSrc: Oral Oral Oral Oral  Resp: _0 Height:      Weight:       SpO2: 99%  100% 97%    Intake/Output Summary (Last 24 hours) at 04/21/14 0742 Last data filed at 04/21/14 0700  Gross per 24 hour  Intake 2613.75 ml  Output   1402 ml  Net 1211.75 ml    Exam: Gen:  NAD Cardiovascular:  RRR, No M/R/G Respiratory:  Lungs CTAB Gastrointestinal:  Abdomen soft, NT/ND, + BS Extremities:  No C/E/C   Data Reviewed:    Labs: Basic Metabolic Panel:  Recent Labs Lab 04/16/14 0934  04/19/14 2151 04/20/14 0313 04/21/14 0550  NA 139  --  134* 133* 137  K 3.9  < > 4.0 4.0 4.2  CL  --   --  103 102 104  CO2 24  --  _1 GLUCOSE 115  --  104* 94 90  BUN 12.2  --  _2 CREATININE 1.0  --  1.16 1.15 1.06  CALCIUM 8.8  --  8.8 8.7 8.5  < > = values in this interval not displayed. GFR Estimated Creatinine Clearance: 80.3 mL/min (by C-G formula based on Cr of 1.06). Liver Function Tests:  Recent Labs Lab 04/16/14 0934 04/19/14 2151  AST 34 30  ALT 38 24  ALKPHOS 76 66  BILITOT 0.81 0.7  PROT 7.6 6.7  ALBUMIN 3.9 3.8   Coagulation profile  Recent Labs Lab 04/20/14 0313  INR 1.04    CBC:  Recent Labs Lab 04/16/14 0934 04/19/14 2151 04/20/14 0313 04/21/14 0550  WBC 5.1 7.4 6.8 5.5  NEUTROABS 1.8 5.5  --   --   HGB 13.0 11.0* 11.0* 12.1*  HCT 40.5 33.4* 33.7* 35.7*  MCV 92.2 92.3 92.3 92.0  PLT 168 105* 96* 97*   CBG:  Recent Labs Lab 04/20/14 0244 04/20/14 0728 04/20/14 1208 04/20/14 1701 04/20/14 2045  GLUCAP 78 79 76 115* 115*   Sepsis Labs:  Recent Labs Lab 04/16/14 0934 04/19/14 2151 04/19/14 2152 04/20/14 0313 04/21/14 0550  WBC 5.1 7.4  --  6.8 5.5  LATICACIDVEN  --   --  1.78  --   --    Microbiology Recent Results (from the past 240 hour(s))  Urine culture     Status: None   Collection Time: 04/19/14  9:52 PM  Result Value Ref Range Status   Specimen Description URINE, CLEAN CATCH  Final   Special Requests Normal  Final   Colony Count NO GROWTH Performed at [facility]    Final   Culture NO GROWTH Performed at [facility]   Final   Report Status 04/20/2014 FINAL  Final     Medications:   . acyclovir  400 mg Oral Daily  . aspirin  81 mg Oral Daily  . ceFEPime (MAXIPIME) IV  1 g Intravenous Q8H  . cholecalciferol  2,000 Units Oral Daily  . famotidine  40 mg Oral Daily  . folic acid  1 mg Oral Daily  . hydrocortisone   Rectal BID  . insulin aspart  0-9 Units Subcutaneous TID WC  . insulin glargine  5 Units Subcutaneous QHS  . magnesium oxide  400 mg Oral Daily  . multivitamin with minerals  1 tablet Oral Daily  . pantoprazole  40 mg Oral Daily  . polyethylene glycol  17 g Oral Daily  .  pravastatin  40 mg Oral Daily  . sodium chloride  3 mL Intravenous Q12H  . vancomycin  1,000 mg Intravenous Q12H   Continuous Infusions: . sodium chloride 0.9 % 1,000 mL with potassium chloride 20 mEq infusion 75 mL/hr at 04/20/14 1927    Time spent: 25 minutes.   LOS: 2 days   RAMA,CHRISTINA  [facility] Pager [phone]. If unable to reach me by pager, please call my cell phone at [phone].  *Please refer to [email or web address], password [email or web address] to get updated schedule on who will round on this patient, as hospitalists switch teams weekly. If 7PM-7AM, please contact night-coverage at [email or web address], password [email or web address] for any overnight needs.  04/21/2014, 7:42 AM

## 2014-04-22 DIAGNOSIS — A047 Enterocolitis due to Clostridium difficile: Secondary | ICD-10-CM

## 2014-04-22 LAB — GLUCOSE, CAPILLARY: Glucose-Capillary: 89 mg/dL (ref 70–99)

## 2014-04-22 MED ORDER — SACCHAROMYCES BOULARDII 250 MG PO CAPS: 250.0000 mg | ORAL_CAPSULE | Freq: Two times a day (BID) | ORAL | Status: DC

## 2014-04-22 MED ORDER — METRONIDAZOLE 500 MG PO TABS: 500.0000 mg | ORAL_TABLET | Freq: Three times a day (TID) | ORAL | Status: DC

## 2014-04-22 MED ORDER — CYCLOPHOSPHAMIDE 50 MG PO TABS: ORAL_TABLET | ORAL | Status: DC

## 2014-04-22 NOTE — Discharge Summary (Signed)
Physician Discharge Summary  JABBAR PALMERO ZRA:[id number] DOB: Sep 06, 1950 DOA: 04/19/2014  PCP: Gennette Pac, MD  Admit date: 04/19/2014 Discharge date: 04/22/2014   Recommendations for Outpatient Follow-Up:   1. F/U scheduled with Dr. Alvy Bimler this week.  Please re-check electrolytes at that visit to ensure stability given diarrhea. 2. Avoid PPIs and broad spectrum antibiotics, if possible.   Discharge Diagnosis:   Principal Problem:    SIRS (systemic inflammatory response syndrome) Active Problems:    Multiple myeloma    Diabetes mellitus without complication    HLD (hyperlipidemia)    GERD (gastroesophageal reflux disease)    Fever    Colitis, Clostridium difficile    Diarrhea   Discharge Condition: Improved.  Diet recommendation:  Regular.   History of Present Illness:   Tyler Willis is an 64 y.o. male with a PMH of multiple myeloma, diabetes mellitus, testicular seminoma, GERD, hyperlipidemia who was admitted 04/19/14 with a one-day history of fever up to 101.43F. The patient has been receiving Cytoxan and Velcade for his myeloma under the guidance of Dr. Alvy Bimler. The patient's last dose of Velcade was on the day of admission. His last dose of Cytoxan was 04/16/2014. The patient had 2 recent admissions to the hospital, from 03/22/2014 through 03/26/2014 and from 03/08/2014 through 03/11/2014. For these admissions, the patient had a similar presentation such that the patient presented with SIRS criteria. After rehydration, the patient developed pulmonary infiltrates and was treated for pneumonia. On his most recent admission from which he was discharged on 03/26/2014, the patient also suffered Clostridium difficile colitis. In the emergency department, the patient had a temperature of 99.43F with soft blood pressure of 92/61. Oxygen saturation was 94-97% on room air. BMP was essentially unremarkable except for serum creatinine 1.16. Hepatic enzymes were  unremarkable. CBC showed platelets 105,000, WBC 7.4, hemoglobin 11.0. Lactic acid was 1.78. Urinalysis was negative for pyuria. Chest x-ray showed bibasilar atelectasis without consolidation.   Hospital Course by Problem:   Principal Problem:  SIRS (systemic inflammatory response syndrome)/fever  Initially put on empiric vancomycin/cefepime given patient's immunosuppressed status with recent chemotherapy for his myeloma. Empiric antibiotics discontinued 04/21/14 in light of diarrhea and negative work up. . Chest x-ray was negative. Urine culture negative. Empiric oral vancomycin started 04/21/14 due to prior C. Diff infection and high risk for reoccurance.  Influenza panel negative.  No further antibiotics other than Flagyl at D/C.    Active Problems:  Diarrhea/recurrent C. Difficile diarrhea  Was high risk for C. difficile given multiple hospitalizations with need for high potency antibiotics related to chronic immune suppression from chemotherapy and prior episode of C. difficile in the setting of multiple myeloma.  C. difficile PCR +, diarrhea mild.  D/C home on a 2 week course of Flagyl and probiotics.   Multiple myeloma  Dr. Alvy Bimler notified of the patient's admission.  Patient instructed to hold his dose of Cytoxan scheduled for 04/23/14.   Diabetes mellitus without complication  Hemoglobin A1c 6.4% on 03/23/14.   Resume metformin at discharge.   Thrombocytopenia   Likely from recent chemotherapy.  No current indication for transfusion.   Hematochezia secondary to hemorrhoids   Continue Anusol suppositories. Hemoglobin stable at discharge. Consider holding aspirin if persistent.   HLD (hyperlipidemia)  Continue Pravachol.   GERD (gastroesophageal reflux disease)  PPI discontinued and instructed to avoid this class of medications in light of C. Difficile..    Medical Consultants:    None.   Discharge Exam:   [name]  Vitals:   04/22/14 0616  BP:  120/60  Pulse: 77  Temp:   Resp: 20   Filed Vitals:   04/21/14 1036 04/21/14 1355 04/21/14 2137 04/22/14 0616  BP: 135/69 119/73 125/71 120/60  Pulse: 80 85 70 77  Temp: 97.8 F (36.6 C) 99.1 F (37.3 C) 99.2 F (37.3 C)   TempSrc: Oral Oral Oral Oral  Resp: 18 20 20 20   Height:      Weight:      SpO2: 99% 99% 96% 98%    Gen:  NAD Cardiovascular:  RRR, No M/R/G Respiratory: Lungs CTAB Gastrointestinal: Abdomen soft, NT/ND with normal active bowel sounds. Extremities: No C/E/C   The results of significant diagnostics from this hospitalization (including imaging, microbiology, ancillary and laboratory) are listed below for reference.     Procedures and Diagnostic Studies:   Dg Chest 2 View 04/19/2014: Stable chest x-ray without evidence of acute cardiopulmonary process.  Labs:   Basic Metabolic Panel:  Recent Labs Lab 04/16/14 0934  04/19/14 2151 04/20/14 0313 04/21/14 0550  NA 139  --  134* 133* 137  K 3.9  < > 4.0 4.0 4.2  CL  --   --  103 102 104  CO2 24  --  25 24 26   GLUCOSE 115  --  104* 94 90  BUN 12.2  --  23 23 13   CREATININE 1.0  --  1.16 1.15 1.06  CALCIUM 8.8  --  8.8 8.7 8.5  < > = values in this interval not displayed. GFR Estimated Creatinine Clearance: 80.3 mL/min (by C-G formula based on Cr of 1.06). Liver Function Tests:  Recent Labs Lab 04/16/14 0934 04/19/14 2151  AST 34 30  ALT 38 24  ALKPHOS 76 66  BILITOT 0.81 0.7  PROT 7.6 6.7  ALBUMIN 3.9 3.8   Coagulation profile  Recent Labs Lab 04/20/14 0313  INR 1.04    CBC:  Recent Labs Lab 04/16/14 0934 04/19/14 2151 04/20/14 0313 04/21/14 0550  WBC 5.1 7.4 6.8 5.5  NEUTROABS 1.8 5.5  --   --   HGB 13.0 11.0* 11.0* 12.1*  HCT 40.5 33.4* 33.7* 35.7*  MCV 92.2 92.3 92.3 92.0  PLT 168 105* 96* 97*   CBG:  Recent Labs Lab 04/21/14 0745 04/21/14 1146 04/21/14 1638 04/21/14 2134 04/22/14 0717  GLUCAP 88 77 104* 118* 55   Microbiology Recent Results (from the  past 240 hour(s))  Blood culture (routine x 2)     Status: None (Preliminary result)   Collection Time: 04/19/14  9:51 PM  Result Value Ref Range Status   Specimen Description BLOOD BLOOD LEFT FOREARM  Final   Special Requests BOTTLES DRAWN AEROBIC AND ANAEROBIC 5CC  Final   Culture   Final           BLOOD CULTURE RECEIVED NO GROWTH TO DATE CULTURE WILL BE HELD FOR 5 DAYS BEFORE ISSUING A FINAL NEGATIVE REPORT Performed at [facility]    Report Status PENDING  Incomplete  Blood culture (routine x 2)     Status: None (Preliminary result)   Collection Time: 04/19/14  9:51 PM  Result Value Ref Range Status   Specimen Description BLOOD RIGHT ANTECUBITAL  Final   Special Requests BOTTLES DRAWN AEROBIC AND ANAEROBIC 5CC  Final   Culture   Final           BLOOD CULTURE RECEIVED NO GROWTH TO DATE CULTURE WILL BE HELD FOR 5 DAYS BEFORE ISSUING A FINAL NEGATIVE  REPORT Performed at [facility]    Report Status PENDING  Incomplete  Urine culture     Status: None   Collection Time: 04/19/14  9:52 PM  Result Value Ref Range Status   Specimen Description URINE, CLEAN CATCH  Final   Special Requests Normal  Final   Colony Count NO GROWTH Performed at [facility]   Final   Culture NO GROWTH Performed at [facility]   Final   Report Status 04/20/2014 FINAL  Final  Clostridium Difficile by PCR     Status: Abnormal   Collection Time: 04/20/14  9:03 PM  Result Value Ref Range Status   C difficile by pcr POSITIVE (A) NEGATIVE Final    Comment: CRITICAL RESULT CALLED TO, READ BACK BY AND VERIFIED WITH: CHEEK,T RN 04/20/14 [place] Performed at [facility]      Discharge Instructions:       Discharge Instructions    Call MD for:  extreme fatigue    Complete by:  As directed      Call MD for:  persistant nausea and vomiting    Complete by:  As directed      Call MD for:  temperature >100.4    Complete by:  As directed      Diet general     Complete by:  As directed      Discharge instructions    Complete by:  As directed   You were cared for by Dr. Jacquelynn Cree  (a hospitalist) during your hospital stay. If you have any questions about your discharge medications or the care you received while you were in the hospital after you are discharged, you can call the unit and ask to speak with the hospitalist on call if the hospitalist that took care of you is not available. Once you are discharged, your primary care physician will handle any further medical issues. Please note that NO REFILLS for any discharge medications will be authorized once you are discharged, as it is imperative that you return to your primary care physician (or establish a relationship with a primary care physician if you do not have one) for your aftercare needs so that they can reassess your need for medications and monitor your lab values.  Any outstanding tests can be reviewed by your PCP at your follow up visit.  It is also important to review any medicine changes with your PCP.  Please bring these d/c instructions with you to your next visit so your physician can review these changes with you.  If you do not have a primary care physician, you can call [phone] for a physician referral.  It is highly recommended that you obtain a PCP for hospital follow up.     Increase activity slowly    Complete by:  As directed             Medication List    STOP taking these medications        acyclovir 400 MG tablet  Commonly known as:  ZOVIRAX     benzonatate 100 MG capsule  Commonly known as:  TESSALON     levofloxacin 750 MG tablet  Commonly known as:  LEVAQUIN     omeprazole 20 MG capsule  Commonly known as:  PRILOSEC     polyethylene glycol packet  Commonly known as:  MIRALAX / GLYCOLAX      TAKE these medications        acetaminophen 325 MG  tablet  Commonly known as:  TYLENOL  Take 2 tablets (650 mg total) by mouth every 6 (six) hours as needed  for mild pain (or Fever >/= 101).     aspirin 81 MG chewable tablet  Chew 81 mg by mouth daily.     CALCIUM PO  Take 1,000 mg by mouth daily.     cyclophosphamide 50 MG tablet  Commonly known as:  CYTOXAN  Hold until Dr. Alvy Bimler tells you to resume.  Take 6 tablets on days 1,8,15 of chemo 1hr before or 2hr after meals every 21d.     DAYQUIL PO  Take 30 mLs by mouth daily as needed (cold symptoms).     dexamethasone 4 MG tablet  Commonly known as:  DECADRON  Take 10 pills every Monday with breakfast     famotidine 40 MG tablet  Commonly known as:  PEPCID  Take 1 tablet (40 mg total) by mouth daily.     folic acid 1 MG tablet  Commonly known as:  FOLVITE  Take 1 tablet (1 mg total) by mouth daily.     guaiFENesin 100 MG/5ML Soln  Commonly known as:  ROBITUSSIN  Take 10 mLs (200 mg total) by mouth every 4 (four) hours as needed.     hydrocortisone 2.5 % rectal cream  Commonly known as:  ANUSOL-HC  Place rectally 2 (two) times daily.     insulin glargine 100 UNIT/ML injection  Commonly known as:  LANTUS  Inject 5-6 Units into the skin at bedtime. Take 5 units at Bedtime Everyday Except on Mondays Take 6 units.     Magnesium 400 MG Tabs  Take 400 mg by mouth daily.     metFORMIN 500 MG tablet  Commonly known as:  GLUCOPHAGE  Take 500 mg by mouth 2 (two) times daily with a meal.     metroNIDAZOLE 500 MG tablet  Commonly known as:  FLAGYL  Take 1 tablet (500 mg total) by mouth every 8 (eight) hours.     multivitamin tablet  Take 1 tablet by mouth daily.     naproxen sodium 220 MG tablet  Commonly known as:  ANAPROX  Take 440 mg by mouth daily as needed (pain).     pravastatin 40 MG tablet  Commonly known as:  PRAVACHOL  Take 40 mg by mouth daily. Daily     prochlorperazine 10 MG tablet  Commonly known as:  COMPAZINE  Take 1 tablet (10 mg total) by mouth every 6 (six) hours as needed (Nausea or vomiting).     saccharomyces boulardii 250 MG capsule  Commonly  known as:  FLORASTOR  Take 1 capsule (250 mg total) by mouth 2 (two) times daily.     Vitamin D3 2000 UNITS Tabs  Take 1 tablet by mouth daily.       Follow-up Information    Follow up with Gennette Pac, MD.   Specialty:  Family Medicine   Why:  As needed   Contact information:   [place] [address] [phone]       Follow up with [facility], NI, MD.   Specialty:  Hematology and Oncology   Why:  At your scheduled appointment time    Contact information:   [place] [id number] [phone]        Time coordinating discharge: 35 minutes.  Signed:  Shakevia Sarris  Pager [phone] [facility] 04/22/2014, 12:55 PM

## 2014-04-26 ENCOUNTER — Telehealth: Payer: Self-pay | Admitting: Hematology and Oncology

## 2014-04-26 ENCOUNTER — Other Ambulatory Visit (HOSPITAL_BASED_OUTPATIENT_CLINIC_OR_DEPARTMENT_OTHER): Payer: BC Managed Care – PPO

## 2014-04-26 ENCOUNTER — Ambulatory Visit (HOSPITAL_BASED_OUTPATIENT_CLINIC_OR_DEPARTMENT_OTHER): Payer: BC Managed Care – PPO | Admitting: Hematology and Oncology

## 2014-04-26 ENCOUNTER — Encounter: Payer: Self-pay | Admitting: Hematology and Oncology

## 2014-04-26 VITALS — BP 127/71 | HR 95 | Temp 98.2°F | Resp 19 | Ht 72.0 in | Wt 186.0 lb

## 2014-04-26 DIAGNOSIS — E119 Type 2 diabetes mellitus without complications: Secondary | ICD-10-CM

## 2014-04-26 DIAGNOSIS — A0472 Enterocolitis due to Clostridium difficile, not specified as recurrent: Secondary | ICD-10-CM

## 2014-04-26 DIAGNOSIS — C9000 Multiple myeloma not having achieved remission: Secondary | ICD-10-CM

## 2014-04-26 DIAGNOSIS — A047 Enterocolitis due to Clostridium difficile: Secondary | ICD-10-CM

## 2014-04-26 DIAGNOSIS — E859 Amyloidosis, unspecified: Secondary | ICD-10-CM

## 2014-04-26 DIAGNOSIS — I2510 Atherosclerotic heart disease of native coronary artery without angina pectoris: Secondary | ICD-10-CM

## 2014-04-26 LAB — CBC WITH DIFFERENTIAL/PLATELET
BASO%: 0.1 % (ref 0.0–2.0)
Basophils Absolute: 0 10*3/uL (ref 0.0–0.1)
EOS%: 3.3 % (ref 0.0–7.0)
Eosinophils Absolute: 0.3 10*3/uL (ref 0.0–0.5)
HCT: 39.6 % (ref 38.4–49.9)
HGB: 13.2 g/dL (ref 13.0–17.1)
LYMPH%: 26.6 % (ref 14.0–49.0)
MCH: 30.6 pg (ref 27.2–33.4)
MCHC: 33.3 g/dL (ref 32.0–36.0)
MCV: 91.9 fL (ref 79.3–98.0)
MONO#: 1.5 10*3/uL — AB (ref 0.1–0.9)
MONO%: 18.7 % — AB (ref 0.0–14.0)
NEUT%: 51.3 % (ref 39.0–75.0)
NEUTROS ABS: 4.1 10*3/uL (ref 1.5–6.5)
PLATELETS: 281 10*3/uL (ref 140–400)
RBC: 4.31 10*6/uL (ref 4.20–5.82)
RDW: 15.6 % — AB (ref 11.0–14.6)
WBC: 7.9 10*3/uL (ref 4.0–10.3)
lymph#: 2.1 10*3/uL (ref 0.9–3.3)

## 2014-04-26 LAB — CULTURE, BLOOD (ROUTINE X 2)
CULTURE: NO GROWTH
Culture: NO GROWTH

## 2014-04-26 LAB — COMPREHENSIVE METABOLIC PANEL (CC13)
ALT: 62 U/L — AB (ref 0–55)
AST: 52 U/L — ABNORMAL HIGH (ref 5–34)
Albumin: 4 g/dL (ref 3.5–5.0)
Alkaline Phosphatase: 80 U/L (ref 40–150)
Anion Gap: 10 mEq/L (ref 3–11)
BILIRUBIN TOTAL: 1.01 mg/dL (ref 0.20–1.20)
BUN: 21.7 mg/dL (ref 7.0–26.0)
CO2: 26 meq/L (ref 22–29)
CREATININE: 1.2 mg/dL (ref 0.7–1.3)
Calcium: 10.2 mg/dL (ref 8.4–10.4)
Chloride: 101 mEq/L (ref 98–109)
EGFR: 63 mL/min/{1.73_m2} — AB (ref >90)
Glucose: 132 mg/dl (ref 70–140)
Potassium: 4.2 mEq/L (ref 3.5–5.1)
Sodium: 138 mEq/L (ref 136–145)
Total Protein: 7.9 g/dL (ref 6.4–8.3)

## 2014-04-26 MED ORDER — OXYCODONE HCL 5 MG PO TABS: 5.0000 mg | ORAL_TABLET | Freq: Four times a day (QID) | ORAL | Status: DC | PRN

## 2014-04-26 NOTE — Telephone Encounter (Signed)
gv adn printed appt sched adn avs for pt for Feb °

## 2014-04-27 ENCOUNTER — Ambulatory Visit: Payer: BC Managed Care – PPO | Admitting: Hematology and Oncology

## 2014-04-27 ENCOUNTER — Other Ambulatory Visit: Payer: BC Managed Care – PPO

## 2014-04-27 NOTE — Assessment & Plan Note (Signed)
He has lost 10 pounds of weight recently due to recent infection. Metformin was placed on hold due to recent dehydration. I recommend the patient to resume metformin but only take it once a day.

## 2014-04-27 NOTE — Assessment & Plan Note (Signed)
He tolerated treatment poorly. With his last admission, he have Clostridium difficile infection. I recommend holding off Cytoxan and continue on dexamethasone and Velcade only. After he completed cycle 4 of treatment, I recommend he reduce dexamethasone to 20 mg weekly.

## 2014-04-27 NOTE — Progress Notes (Signed)
[facility] OFFICE PROGRESS NOTE  Patient Care Team: Hulan Fess, MD as PCP - General (Family Medicine) Heath Lark, MD as Consulting Physician (Hematology and Oncology)  SUMMARY OF ONCOLOGIC HISTORY: Oncology History   ISS Staging II, M-spike 1.7, IgG 2420, kappa 30.1, beta46mcroglobulin 4.48, Creatinine 1.2, calcium 9.8, hemoglobin 13.2, albumin 4.2     Multiple myeloma   01/10/2003 Pathology Results [id number] pathology showed pure seminoma with lymphovascular invasion, pT2 NX MX   01/10/2003 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 congo red staing reveals amyloid deposition within some of the amorphous material and within blood vessels. Normal cytogenetics.   02/14/2014 Imaging PET CT scan showed diffuse skeletal involvement   02/26/2014 -  Chemotherapy He is started on chemotherapy with Velcade, Cytoxan and dexamethasone. Cytoxan was discontinued due to recurrent admission and recent clostridium Diff infection   03/09/2014 - 03/11/2014 Hospital Admission He was admitted to the hospital with fevers and chills and was diagnosed with pneumonia   03/22/2014 - 03/26/2014 Hospital Admission He is admitted to the hospital again for fever, chills and possible pneumonia   04/19/2014 - 04/22/2014 Hospital Admission He is admitted to the hospital for fever, chills and diarrhea and was subsequently found to have C. difficile infection    INTERVAL HISTORY: Please see below for problem oriented charting. He is seen prior to cycle 4 of  treatment. He was readmitted to the hospital recently for fevers and chills and was placed on broad-spectrum antibiotics. Subsequently, he developed diarrhea and was tested positive for Clostridium difficile. His diarrhea is improving. He has intermittent rectal bleeding recently. He denies fevers or chills. He has lost 10 pounds of weight.  REVIEW OF SYSTEMS:   Eyes: Denies blurriness of vision Ears, nose, mouth, throat, and face: Denies mucositis or sore throat Respiratory: Denies cough, dyspnea or wheezes Cardiovascular: Denies palpitation, chest discomfort or lower extremity swelling Skin: Denies abnormal skin rashes Lymphatics: Denies new lymphadenopathy or easy bruising Neurological:Denies numbness, tingling or new weaknesses Behavioral/Psych: Mood is stable, no new changes  All other systems were reviewed with the patient and are negative.  I have reviewed the past medical history, past surgical history, social history and family history with the patient and they are unchanged from previous note.  ALLERGIES:  has No Known Allergies.  MEDICATIONS:  Current Outpatient Prescriptions  Medication Sig Dispense Refill  . acetaminophen (TYLENOL) 325 MG tablet Take 2 tablets (650 mg total) by mouth every 6 (six) hours as needed for mild pain (or Fever >/= 101). 30 tablet 0  . aspirin 81 MG chewable tablet Chew 81 mg by mouth daily.    .Marland KitchenCALCIUM PO Take 1,000 mg by mouth daily.    . Cholecalciferol (VITAMIN D3) 2000 UNITS TABS Take 1 tablet by mouth daily.     . cyclophosphamide (CYTOXAN) 50 MG tablet Hold until Dr. GAlvy Bimlertells you to resume.  Take 6 tablets on days 1,8,15 of chemo 1hr before or 2hr after meals every 21d. 60 tablet 3  . dexamethasone (DECADRON) 4 MG tablet Take 10 pills every Monday with breakfast 90 tablet  1  . famotidine (PEPCID) 40 MG tablet Take 1 tablet (40 mg total) by mouth daily. 30 tablet 0  . folic acid (FOLVITE) 1 MG tablet Take 1 tablet (1 mg total) by mouth  daily. 30 tablet 0  . guaiFENesin (ROBITUSSIN) 100 MG/5ML SOLN Take 10 mLs (200 mg total) by mouth every 4 (four) hours as needed. 1200 mL 0  . hydrocortisone (ANUSOL-HC) 2.5 % rectal cream Place rectally 2 (two) times daily. 30 g 1  . insulin glargine (LANTUS) 100 UNIT/ML injection Inject 5-6 Units into the skin at bedtime. Take 5 units at Bedtime Everyday Except on Mondays Take 6 units.    . Magnesium 400 MG TABS Take 400 mg by mouth daily.    . metroNIDAZOLE (FLAGYL) 500 MG tablet Take 1 tablet (500 mg total) by mouth every 8 (eight) hours. 42 tablet 0  . Multiple Vitamin (MULTIVITAMIN) tablet Take 1 tablet by mouth daily.    . naproxen sodium (ANAPROX) 220 MG tablet Take 440 mg by mouth daily as needed (pain).     . pravastatin (PRAVACHOL) 40 MG tablet Take 40 mg by mouth daily. Daily  1  . prochlorperazine (COMPAZINE) 10 MG tablet Take 1 tablet (10 mg total) by mouth every 6 (six) hours as needed (Nausea or vomiting). 30 tablet 1  . Pseudoephedrine-APAP-DM (DAYQUIL PO) Take 30 mLs by mouth daily as needed (cold symptoms).     . saccharomyces boulardii (FLORASTOR) 250 MG capsule Take 1 capsule (250 mg total) by mouth 2 (two) times daily. 60 capsule 2  . metFORMIN (GLUCOPHAGE) 500 MG tablet Take 500 mg by mouth 2 (two) times daily with a meal.    . oxyCODONE (OXY IR/ROXICODONE) 5 MG immediate release tablet Take 1 tablet (5 mg total) by mouth every 6 (six) hours as needed for severe pain. 30 tablet 0   No current facility-administered medications for this visit.    PHYSICAL EXAMINATION: ECOG PERFORMANCE STATUS: 1 - Symptomatic but completely ambulatory  Filed Vitals:   04/26/14 1008  BP: 127/71  Pulse: 95  Temp: 98.2 F (36.8 C)  Resp: 19   Filed Weights   04/26/14 1008  Weight: 186 lb (84.369 kg)    GENERAL:alert, no distress and comfortable SKIN: skin color, texture, turgor are normal, no rashes or significant lesions EYES: normal, Conjunctiva are pink and non-injected,  sclera clear OROPHARYNX:no exudate, no erythema and lips, buccal mucosa, and tongue normal  NECK: supple, thyroid normal size, non-tender, without nodularity LYMPH:  no palpable lymphadenopathy in the cervical, axillary or inguinal LUNGS: clear to auscultation and percussion with normal breathing effort HEART: regular rate & rhythm and no murmurs and no lower extremity edema ABDOMEN:abdomen soft, non-tender and normal bowel sounds Musculoskeletal:no cyanosis of digits and no clubbing  NEURO: alert & oriented x 3 with fluent speech, no focal motor/sensory deficits  LABORATORY DATA:  I have reviewed the data as listed    Component Value Date/Time   NA 138 04/26/2014 0950   NA 137 04/21/2014 0550   K 4.2 04/26/2014 0950   K 4.2 04/21/2014 0550   CL 104 04/21/2014 0550   CO2 26 04/26/2014 0950   CO2 26 04/21/2014 0550   GLUCOSE 132 04/26/2014 0950   GLUCOSE 90 04/21/2014 0550   BUN 21.7 04/26/2014 0950   BUN 13 04/21/2014 0550   CREATININE 1.2 04/26/2014 0950   CREATININE 1.06 04/21/2014 0550   CALCIUM 10.2 04/26/2014 0950   CALCIUM 8.5 04/21/2014 0550   PROT 7.9 04/26/2014  0950   PROT 6.7 04/19/2014 2151   ALBUMIN 4.0 04/26/2014 0950   ALBUMIN 3.8 04/19/2014 2151   AST 52* 04/26/2014 0950   AST 30 04/19/2014 2151   ALT 62* 04/26/2014 0950   ALT 24 04/19/2014 2151   ALKPHOS 80 04/26/2014 0950   ALKPHOS 66 04/19/2014 2151   BILITOT 1.01 04/26/2014 0950   BILITOT 0.7 04/19/2014 2151   GFRNONAA 74* 04/21/2014 0550   GFRAA 86* 04/21/2014 0550    No results found for: SPEP, UPEP  Lab Results  Component Value Date   WBC 7.9 04/26/2014   NEUTROABS 4.1 04/26/2014   HGB 13.2 04/26/2014   HCT 39.6 04/26/2014   MCV 91.9 04/26/2014   PLT 281 04/26/2014      Chemistry      Component Value Date/Time   NA 138 04/26/2014 0950   NA 137 04/21/2014 0550   K 4.2 04/26/2014 0950   K 4.2 04/21/2014 0550   CL 104 04/21/2014 0550   CO2 26 04/26/2014 0950   CO2 26 04/21/2014 0550    BUN 21.7 04/26/2014 0950   BUN 13 04/21/2014 0550   CREATININE 1.2 04/26/2014 0950   CREATININE 1.06 04/21/2014 0550      Component Value Date/Time   CALCIUM 10.2 04/26/2014 0950   CALCIUM 8.5 04/21/2014 0550   ALKPHOS 80 04/26/2014 0950   ALKPHOS 66 04/19/2014 2151   AST 52* 04/26/2014 0950   AST 30 04/19/2014 2151   ALT 62* 04/26/2014 0950   ALT 24 04/19/2014 2151   BILITOT 1.01 04/26/2014 0950   BILITOT 0.7 04/19/2014 2151      ASSESSMENT & PLAN:  Multiple myeloma He tolerated treatment poorly. With his last admission, he have Clostridium difficile infection. I recommend holding off Cytoxan and continue on dexamethasone and Velcade only. After he completed cycle 4 of treatment, I recommend he reduce dexamethasone to 20 mg weekly.   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 The patient should be transplant candidate.   Colitis, Clostridium difficile He will continue to hold Cytoxan. He is on medication with Flagyl and his diarrhea is improving.   Coronary artery calcification seen on CAT scan I recommend he hold aspirin due to recent colitis/bleeding from his stool. Once his colitis resolve, he can resume aspirin after that.   Diabetes mellitus without complication He has lost 10 pounds of weight recently due to recent infection. Metformin was placed on hold due to recent dehydration. I recommend the patient to resume metformin but only take it once a day.    Orders Placed This Encounter  Procedures  . SPEP & IFE with QIG    Standing Status: Future     Number of Occurrences:      Standing Expiration Date: 05/31/2015  . Kappa/lambda light chains    Standing Status: Future     Number of Occurrences:      Standing Expiration Date: 05/31/2015  . Beta 2 microglobulin, serum    Standing Status: Future     Number of Occurrences:      Standing Expiration Date: 05/31/2015   All questions were answered. The patient knows to  call the clinic with any problems, questions or concerns. No barriers to learning was detected. I spent 30 minutes counseling the patient face to face. The total time spent in the appointment was 40 minutes and more than 50% was on counseling and review of test results     [facility], Jacklyn Branan, MD 04/27/2014 10:21 AM

## 2014-04-27 NOTE — Assessment & Plan Note (Signed)
Thankfully, he does not have secondary amyloidosis involving his heart. The patient should be transplant candidate.

## 2014-04-27 NOTE — Assessment & Plan Note (Signed)
I recommend he hold aspirin due to recent colitis/bleeding from his stool. Once his colitis resolve, he can resume aspirin after that.

## 2014-04-27 NOTE — Assessment & Plan Note (Signed)
He will continue to hold Cytoxan. He is on medication with Flagyl and his diarrhea is improving.

## 2014-04-30 ENCOUNTER — Ambulatory Visit (HOSPITAL_BASED_OUTPATIENT_CLINIC_OR_DEPARTMENT_OTHER): Payer: BC Managed Care – PPO

## 2014-04-30 ENCOUNTER — Other Ambulatory Visit: Payer: Self-pay | Admitting: *Deleted

## 2014-04-30 DIAGNOSIS — D638 Anemia in other chronic diseases classified elsewhere: Secondary | ICD-10-CM

## 2014-04-30 DIAGNOSIS — Z5112 Encounter for antineoplastic immunotherapy: Secondary | ICD-10-CM

## 2014-04-30 DIAGNOSIS — C9000 Multiple myeloma not having achieved remission: Secondary | ICD-10-CM

## 2014-04-30 MED ORDER — ONDANSETRON HCL 8 MG PO TABS
8.0000 mg | ORAL_TABLET | Freq: Once | ORAL | Status: AC
  Administered 2014-04-30: 8 mg via ORAL

## 2014-04-30 MED ORDER — FOLIC ACID 1 MG PO TABS: 1.0000 mg | ORAL_TABLET | Freq: Every day | ORAL | Status: DC

## 2014-04-30 MED ORDER — BORTEZOMIB CHEMO SQ INJECTION 3.5 MG (2.5MG/ML)
1.3000 mg/m2 | Freq: Once | INTRAMUSCULAR | Status: AC
  Administered 2014-04-30: 2.75 mg via SUBCUTANEOUS
  Filled 2014-04-30: qty 2.75

## 2014-04-30 MED ORDER — ONDANSETRON HCL 8 MG PO TABS
ORAL_TABLET | ORAL | Status: AC
  Filled 2014-04-30: qty 1

## 2014-04-30 NOTE — Patient Instructions (Signed)
[facility] Discharge Instructions for Patients Receiving Chemotherapy  Today you received the following chemotherapy agents: Velcade  To help prevent nausea and vomiting after your treatment, we encourage you to take your nausea medication as prescribed by your physician.   If you develop nausea and vomiting that is not controlled by your nausea medication, call the clinic.   BELOW ARE SYMPTOMS THAT SHOULD BE REPORTED IMMEDIATELY:  *FEVER GREATER THAN 100.5 F  *CHILLS WITH OR WITHOUT FEVER  NAUSEA AND VOMITING THAT IS NOT CONTROLLED WITH YOUR NAUSEA MEDICATION  *UNUSUAL SHORTNESS OF BREATH  *UNUSUAL BRUISING OR BLEEDING  TENDERNESS IN MOUTH AND THROAT WITH OR WITHOUT PRESENCE OF ULCERS  *URINARY PROBLEMS  *BOWEL PROBLEMS  UNUSUAL RASH Items with * indicate a potential emergency and should be followed up as soon as possible.  Feel free to call the clinic you have any questions or concerns. The clinic phone number is [phone].

## 2014-05-03 ENCOUNTER — Other Ambulatory Visit (HOSPITAL_BASED_OUTPATIENT_CLINIC_OR_DEPARTMENT_OTHER): Payer: BC Managed Care – PPO

## 2014-05-03 ENCOUNTER — Ambulatory Visit (HOSPITAL_BASED_OUTPATIENT_CLINIC_OR_DEPARTMENT_OTHER): Payer: BC Managed Care – PPO

## 2014-05-03 DIAGNOSIS — C9000 Multiple myeloma not having achieved remission: Secondary | ICD-10-CM

## 2014-05-03 DIAGNOSIS — Z5112 Encounter for antineoplastic immunotherapy: Secondary | ICD-10-CM

## 2014-05-03 LAB — CBC WITH DIFFERENTIAL/PLATELET
BASO%: 0.1 % (ref 0.0–2.0)
Basophils Absolute: 0 10*3/uL (ref 0.0–0.1)
EOS ABS: 0.1 10*3/uL (ref 0.0–0.5)
EOS%: 1.6 % (ref 0.0–7.0)
HCT: 36.7 % — ABNORMAL LOW (ref 38.4–49.9)
HEMOGLOBIN: 12.2 g/dL — AB (ref 13.0–17.1)
LYMPH%: 27.3 % (ref 14.0–49.0)
MCH: 30.8 pg (ref 27.2–33.4)
MCHC: 33.2 g/dL (ref 32.0–36.0)
MCV: 92.7 fL (ref 79.3–98.0)
MONO#: 1 10*3/uL — ABNORMAL HIGH (ref 0.1–0.9)
MONO%: 12.7 % (ref 0.0–14.0)
NEUT%: 58.3 % (ref 39.0–75.0)
NEUTROS ABS: 4.7 10*3/uL (ref 1.5–6.5)
PLATELETS: 276 10*3/uL (ref 140–400)
RBC: 3.96 10*6/uL — AB (ref 4.20–5.82)
RDW: 15.7 % — AB (ref 11.0–14.6)
WBC: 8.1 10*3/uL (ref 4.0–10.3)
lymph#: 2.2 10*3/uL (ref 0.9–3.3)

## 2014-05-03 LAB — COMPREHENSIVE METABOLIC PANEL (CC13)
ALT: 29 U/L (ref 0–55)
AST: 25 U/L (ref 5–34)
Albumin: 3.8 g/dL (ref 3.5–5.0)
Alkaline Phosphatase: 72 U/L (ref 40–150)
Anion Gap: 9 mEq/L (ref 3–11)
BUN: 15.3 mg/dL (ref 7.0–26.0)
CALCIUM: 8.8 mg/dL (ref 8.4–10.4)
CHLORIDE: 106 meq/L (ref 98–109)
CO2: 24 meq/L (ref 22–29)
CREATININE: 1 mg/dL (ref 0.7–1.3)
EGFR: 78 mL/min/{1.73_m2} — ABNORMAL LOW (ref >90)
GLUCOSE: 160 mg/dL — AB (ref 70–140)
POTASSIUM: 3.6 meq/L (ref 3.5–5.1)
Sodium: 139 mEq/L (ref 136–145)
Total Bilirubin: 0.62 mg/dL (ref 0.20–1.20)
Total Protein: 7.2 g/dL (ref 6.4–8.3)

## 2014-05-03 MED ORDER — ONDANSETRON HCL 8 MG PO TABS
ORAL_TABLET | ORAL | Status: AC
  Filled 2014-05-03: qty 1

## 2014-05-03 MED ORDER — BORTEZOMIB CHEMO SQ INJECTION 3.5 MG (2.5MG/ML)
1.3000 mg/m2 | Freq: Once | INTRAMUSCULAR | Status: AC
  Administered 2014-05-03: 2.75 mg via SUBCUTANEOUS
  Filled 2014-05-03: qty 2.75

## 2014-05-03 MED ORDER — ONDANSETRON HCL 8 MG PO TABS
8.0000 mg | ORAL_TABLET | Freq: Once | ORAL | Status: AC
  Administered 2014-05-03: 8 mg via ORAL

## 2014-05-03 NOTE — Patient Instructions (Signed)
[facility] Discharge Instructions for Patients Receiving Chemotherapy  Today you received the following chemotherapy agents velcade   To help prevent nausea and vomiting after your treatment, we encourage you to take your nausea medication as directed  If you develop nausea and vomiting that is not controlled by your nausea medication, call the clinic.   BELOW ARE SYMPTOMS THAT SHOULD BE REPORTED IMMEDIATELY:  *FEVER GREATER THAN 100.5 F  *CHILLS WITH OR WITHOUT FEVER  NAUSEA AND VOMITING THAT IS NOT CONTROLLED WITH YOUR NAUSEA MEDICATION  *UNUSUAL SHORTNESS OF BREATH  *UNUSUAL BRUISING OR BLEEDING  TENDERNESS IN MOUTH AND THROAT WITH OR WITHOUT PRESENCE OF ULCERS  *URINARY PROBLEMS  *BOWEL PROBLEMS  UNUSUAL RASH Items with * indicate a potential emergency and should be followed up as soon as possible.  Feel free to call the clinic you have any questions or concerns. The clinic phone number is [phone].  

## 2014-05-04 ENCOUNTER — Inpatient Hospital Stay (HOSPITAL_COMMUNITY)
Admission: EM | Admit: 2014-05-04 | Discharge: 2014-05-05 | DRG: 841 | Disposition: A | Discharge status: Home / Self Care | Payer: BC Managed Care – PPO | Attending: Internal Medicine | Admitting: Internal Medicine

## 2014-05-04 ENCOUNTER — Observation Stay (HOSPITAL_COMMUNITY): Payer: BC Managed Care – PPO

## 2014-05-04 ENCOUNTER — Emergency Department (HOSPITAL_COMMUNITY): Payer: BC Managed Care – PPO

## 2014-05-04 ENCOUNTER — Encounter (HOSPITAL_COMMUNITY): Payer: Self-pay | Admitting: Emergency Medicine

## 2014-05-04 DIAGNOSIS — R6510 Systemic inflammatory response syndrome (SIRS) of non-infectious origin without acute organ dysfunction: Secondary | ICD-10-CM | POA: Diagnosis present

## 2014-05-04 DIAGNOSIS — A0472 Enterocolitis due to Clostridium difficile, not specified as recurrent: Secondary | ICD-10-CM | POA: Diagnosis present

## 2014-05-04 DIAGNOSIS — R10819 Abdominal tenderness, unspecified site: Secondary | ICD-10-CM

## 2014-05-04 DIAGNOSIS — R509 Fever, unspecified: Secondary | ICD-10-CM

## 2014-05-04 DIAGNOSIS — C9001 Multiple myeloma in remission: Secondary | ICD-10-CM | POA: Diagnosis present

## 2014-05-04 DIAGNOSIS — A047 Enterocolitis due to Clostridium difficile: Secondary | ICD-10-CM | POA: Diagnosis present

## 2014-05-04 DIAGNOSIS — E853 Secondary systemic amyloidosis: Secondary | ICD-10-CM | POA: Diagnosis present

## 2014-05-04 DIAGNOSIS — Z87891 Personal history of nicotine dependence: Secondary | ICD-10-CM

## 2014-05-04 DIAGNOSIS — G893 Neoplasm related pain (acute) (chronic): Secondary | ICD-10-CM | POA: Diagnosis present

## 2014-05-04 DIAGNOSIS — E859 Amyloidosis, unspecified: Secondary | ICD-10-CM | POA: Diagnosis present

## 2014-05-04 DIAGNOSIS — E119 Type 2 diabetes mellitus without complications: Secondary | ICD-10-CM | POA: Diagnosis present

## 2014-05-04 DIAGNOSIS — K219 Gastro-esophageal reflux disease without esophagitis: Secondary | ICD-10-CM | POA: Diagnosis present

## 2014-05-04 DIAGNOSIS — C9000 Multiple myeloma not having achieved remission: Secondary | ICD-10-CM | POA: Diagnosis not present

## 2014-05-04 DIAGNOSIS — M25512 Pain in left shoulder: Secondary | ICD-10-CM

## 2014-05-04 LAB — CBC WITH DIFFERENTIAL/PLATELET
BASOS PCT: 0 % (ref 0–1)
Basophils Absolute: 0 10*3/uL (ref 0.0–0.1)
EOS PCT: 1 % (ref 0–5)
Eosinophils Absolute: 0.1 10*3/uL (ref 0.0–0.7)
HCT: 33.9 % — ABNORMAL LOW (ref 39.0–52.0)
Hemoglobin: 11.3 g/dL — ABNORMAL LOW (ref 13.0–17.0)
Lymphocytes Relative: 12 % (ref 12–46)
Lymphs Abs: 0.6 10*3/uL — ABNORMAL LOW (ref 0.7–4.0)
MCH: 30.9 pg (ref 26.0–34.0)
MCHC: 33.3 g/dL (ref 30.0–36.0)
MCV: 92.6 fL (ref 78.0–100.0)
MONO ABS: 1 10*3/uL (ref 0.1–1.0)
Monocytes Relative: 20 % — ABNORMAL HIGH (ref 3–12)
Neutro Abs: 3.2 10*3/uL (ref 1.7–7.7)
Neutrophils Relative %: 67 % (ref 43–77)
PLATELETS: 227 10*3/uL (ref 150–400)
RBC: 3.66 MIL/uL — AB (ref 4.22–5.81)
RDW: 15.8 % — ABNORMAL HIGH (ref 11.5–15.5)
WBC: 4.9 10*3/uL (ref 4.0–10.5)

## 2014-05-04 LAB — COMPREHENSIVE METABOLIC PANEL
ALK PHOS: 61 U/L (ref 39–117)
ALT: 26 U/L (ref 0–53)
ANION GAP: 7 (ref 5–15)
AST: 30 U/L (ref 0–37)
Albumin: 3.8 g/dL (ref 3.5–5.2)
BUN: 18 mg/dL (ref 6–23)
CALCIUM: 8.6 mg/dL (ref 8.4–10.5)
CO2: 25 mmol/L (ref 19–32)
Chloride: 105 mmol/L (ref 96–112)
Creatinine, Ser: 1.12 mg/dL (ref 0.50–1.35)
GFR calc Af Amer: 80 mL/min — ABNORMAL LOW (ref >90)
GFR, EST NON AFRICAN AMERICAN: 69 mL/min — AB (ref >90)
Glucose, Bld: 182 mg/dL — ABNORMAL HIGH (ref 70–99)
Potassium: 4.1 mmol/L (ref 3.5–5.1)
Sodium: 137 mmol/L (ref 135–145)
Total Bilirubin: 0.3 mg/dL (ref 0.3–1.2)
Total Protein: 6.9 g/dL (ref 6.0–8.3)

## 2014-05-04 LAB — URINALYSIS, ROUTINE W REFLEX MICROSCOPIC
Bilirubin Urine: NEGATIVE
GLUCOSE, UA: NEGATIVE mg/dL
HGB URINE DIPSTICK: NEGATIVE
KETONES UR: NEGATIVE mg/dL
LEUKOCYTES UA: NEGATIVE
NITRITE: NEGATIVE
PH: 5 (ref 5.0–8.0)
PROTEIN: NEGATIVE mg/dL
Specific Gravity, Urine: 1.006 (ref 1.005–1.030)
Urobilinogen, UA: 0.2 mg/dL (ref 0.0–1.0)

## 2014-05-04 LAB — I-STAT CG4 LACTIC ACID, ED: LACTIC ACID, VENOUS: 2.1 mmol/L — AB (ref 0.5–2.0)

## 2014-05-04 LAB — RAPID URINE DRUG SCREEN, HOSP PERFORMED
AMPHETAMINES: NOT DETECTED
BENZODIAZEPINES: NOT DETECTED
Barbiturates: NOT DETECTED
Cocaine: NOT DETECTED
OPIATES: NOT DETECTED
TETRAHYDROCANNABINOL: NOT DETECTED

## 2014-05-04 MED ORDER — VANCOMYCIN HCL IN DEXTROSE 1-5 GM/200ML-% IV SOLN
1000.0000 mg | INTRAVENOUS | Status: AC
  Administered 2014-05-04: 1000 mg via INTRAVENOUS
  Filled 2014-05-04: qty 200

## 2014-05-04 MED ORDER — PIPERACILLIN-TAZOBACTAM 3.375 G IVPB
3.3750 g | Freq: Once | INTRAVENOUS | Status: DC
Start: 2014-05-04 — End: 2014-05-05
  Administered 2014-05-05: 3.375 g via INTRAVENOUS
  Filled 2014-05-04: qty 50

## 2014-05-04 MED ORDER — VANCOMYCIN HCL IN DEXTROSE 1-5 GM/200ML-% IV SOLN: 1000.0000 mg | Freq: Two times a day (BID) | INTRAVENOUS | Status: DC

## 2014-05-04 MED ORDER — SODIUM CHLORIDE 0.9 % IV SOLN
Freq: Once | INTRAVENOUS | Status: AC
  Administered 2014-05-04: 20:00:00 via INTRAVENOUS

## 2014-05-04 NOTE — Progress Notes (Signed)
ANTIBIOTIC CONSULT NOTE - INITIAL  Pharmacy Consult for Vancomycin Indication: Sepsis  No Known Allergies  Patient Measurements: Height : 72 inches Weight : 84.4 kg  Vital Signs: Temp: 98.3 F (36.8 C) (02/12 2030) Temp Source: Oral (02/12 2030) BP: 115/67 mmHg (02/12 2200) Pulse Rate: 79 (02/12 2200) Intake/Output from previous day:   Intake/Output from this shift: Total I/O In: 1000 [I.V.:1000] Out: -   Labs:  Recent Labs  05/03/14 0825 05/03/14 0825 05/04/14 1951  WBC 8.1  --  4.9  HGB 12.2*  --  11.3*  PLT 276  --  227  CREATININE  --  1.0 1.12   Estimated Creatinine Clearance: 76 mL/min (by C-G formula based on Cr of 1.12). No results for input(s): VANCOTROUGH, VANCOPEAK, VANCORANDOM, GENTTROUGH, GENTPEAK, GENTRANDOM, TOBRATROUGH, TOBRAPEAK, TOBRARND, AMIKACINPEAK, AMIKACINTROU, AMIKACIN in the last 72 hours.   Microbiology: Recent Results (from the past 720 hour(s))  Blood culture (routine x 2)     Status: None   Collection Time: 04/19/14  9:51 PM  Result Value Ref Range Status   Specimen Description BLOOD BLOOD LEFT FOREARM  Final   Special Requests BOTTLES DRAWN AEROBIC AND ANAEROBIC 5CC  Final   Culture   Final    NO GROWTH 5 DAYS Performed at [facility]    Report Status 04/26/2014 FINAL  Final  Blood culture (routine x 2)     Status: None   Collection Time: 04/19/14  9:51 PM  Result Value Ref Range Status   Specimen Description BLOOD RIGHT ANTECUBITAL  Final   Special Requests BOTTLES DRAWN AEROBIC AND ANAEROBIC 5CC  Final   Culture   Final    NO GROWTH 5 DAYS Performed at [facility]    Report Status 04/26/2014 FINAL  Final  Urine culture     Status: None   Collection Time: 04/19/14  9:52 PM  Result Value Ref Range Status   Specimen Description URINE, CLEAN CATCH  Final   Special Requests Normal  Final   Colony Count NO GROWTH Performed at [facility]   Final   Culture NO GROWTH Performed at [facility]   Final   Report Status 04/20/2014 FINAL  Final  Clostridium Difficile by PCR     Status: Abnormal   Collection Time: 04/20/14  9:03 PM  Result Value Ref Range Status   C difficile by pcr POSITIVE (A) NEGATIVE Final    Comment: CRITICAL RESULT CALLED TO, READ BACK BY AND VERIFIED WITH: CHEEK,T RN 04/20/14 [place] Performed at [place] History: Past Medical History  Diagnosis Date  . Diabetes mellitus without complication   . GERD (gastroesophageal reflux disease)   . 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 Multiple myeloma 02/19/2014    Medications:  Scheduled:   Infusions:  . piperacillin-tazobactam (ZOSYN)  IV    . vancomycin    . [START ON 05/05/2014] vancomycin     Assessment:  64 yr male with history of multiple myeloma, undergoing chemotherapy, presents with fever.  History of developing fevers requiring hospitalization since on chemotherapy.  Most recent admission was 04/20/14- 04/22/14.  Blood and urine cultures ordered  Zosyn 3.375gm x 1 ordered in ED  Pharmacy consulted to dose Vancomycin for sepsis  CrCl (n) = 70 ml/min  Goal of Therapy:  Vancomycin trough level 15-20 mcg/ml  Plan:  Measure antibiotic drug levels at steady state Follow up culture results  Vancomycin 1gm IV q12h F/U  continuation of Zosyn upon admission  Josede Cicero, Toribio Harbour, PharmD 05/04/2014,11:01 PM

## 2014-05-04 NOTE — ED Notes (Signed)
Pt presents with chemo card, fever onset this afternoon up to 100.7 at home Aleve taken at 1745.

## 2014-05-04 NOTE — ED Provider Notes (Signed)
CSN: [id number]     Arrival date & time 05/04/14  1921 History   First MD Initiated Contact with Patient 05/04/14 1928     Chief Complaint  Patient presents with  . Fever     (Consider location/radiation/quality/duration/timing/severity/associated sxs/prior Treatment) HPI  64 year old male with a history of multiple myeloma currently on chemotherapy presents with fever that started this afternoon. His temp was 101.5. He felt chills and overall fatigue. He took 2 Aleve prior to coming into the ER as temperature is now 99.1. Since he has been on chemotherapy every couple weeks he gets a fever and has to be admitted to the hospital. Patient has not had any cough, headache, congestion, sore throat, shortness of breath, abdominal pain, nausea, or vomiting. No urinary symptoms. He does continue to have blood in his stools and is currently on treatment for C. difficile. Patient feels somewhat improved after Aleve.  Past Medical History  Diagnosis Date  . Diabetes mellitus without complication   . GERD (gastroesophageal reflux disease)   . 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 Multiple myeloma 02/19/2014   Past Surgical History  Procedure Laterality Date  . Testicle removal Right 2004   Family History  Problem Relation Age of Onset  . Diabetes Father   . Cancer Maternal Uncle     lung cancer  . Cancer Cousin     myeloma   History  Substance Use Topics  . Smoking status: Former Smoker    Types: Pipe  . Smokeless tobacco: Never Used  . Alcohol Use: No    Review of Systems  Constitutional: Positive for fever, chills and fatigue.  HENT: Negative for congestion and sore throat.   Respiratory: Negative for shortness of breath.   Cardiovascular: Negative for chest pain.  Gastrointestinal: Positive for diarrhea and blood in stool. Negative for nausea, vomiting and abdominal pain.  Genitourinary: Negative for dysuria.  Neurological: Negative for headaches.  All other systems reviewed and are  negative.     Allergies  Review of patient's allergies indicates no known allergies.  Home Medications   Prior to Admission medications   Medication Sig Start Date End Date Taking? Authorizing Provider  acetaminophen (TYLENOL) 325 MG tablet Take 2 tablets (650 mg total) by mouth every 6 (six) hours as needed for mild pain (or Fever >/= 101). 03/26/14   Robbie Lis, MD  aspirin 81 MG chewable tablet Chew 81 mg by mouth daily.    Historical Provider, MD  CALCIUM PO Take 1,000 mg by mouth daily.    Historical Provider, MD  Cholecalciferol (VITAMIN D3) 2000 UNITS TABS Take 1 tablet by mouth daily.     Historical Provider, MD  cyclophosphamide (CYTOXAN) 50 MG tablet Hold until Dr. Alvy Bimler tells you to resume.  Take 6 tablets on days 1,8,15 of chemo 1hr before or 2hr after meals every 21d. 04/22/14   Venetia Maxon Rama, MD  dexamethasone (DECADRON) 4 MG tablet Take 10 pills every Monday with breakfast 02/22/14   Heath Lark, MD  famotidine (PEPCID) 40 MG tablet Take 1 tablet (40 mg total) by mouth daily. 03/26/14   Robbie Lis, MD  folic acid (FOLVITE) 1 MG tablet Take 1 tablet (1 mg total) by mouth daily. 04/30/14   Drue Second, NP  guaiFENesin (ROBITUSSIN) 100 MG/5ML SOLN Take 10 mLs (200 mg total) by mouth every 4 (four) hours as needed. 03/26/14   Robbie Lis, MD  hydrocortisone (ANUSOL-HC) 2.5 % rectal cream Place rectally 2 (two)  times daily. 03/26/14   Robbie Lis, MD  insulin glargine (LANTUS) 100 UNIT/ML injection Inject 5-6 Units into the skin at bedtime. Take 5 units at Bedtime Everyday Except on Mondays Take 6 units.    Historical Provider, MD  Magnesium 400 MG TABS Take 400 mg by mouth daily.    Historical Provider, MD  metFORMIN (GLUCOPHAGE) 500 MG tablet Take 500 mg by mouth 2 (two) times daily with a meal.    Historical Provider, MD  metroNIDAZOLE (FLAGYL) 500 MG tablet Take 1 tablet (500 mg total) by mouth every 8 (eight) hours. 04/22/14   Venetia Maxon Rama, MD  Multiple Vitamin  (MULTIVITAMIN) tablet Take 1 tablet by mouth daily.    Historical Provider, MD  naproxen sodium (ANAPROX) 220 MG tablet Take 440 mg by mouth daily as needed (pain).     Historical Provider, MD  oxyCODONE (OXY IR/ROXICODONE) 5 MG immediate release tablet Take 1 tablet (5 mg total) by mouth every 6 (six) hours as needed for severe pain. 04/26/14   Heath Lark, MD  pravastatin (PRAVACHOL) 40 MG tablet Take 40 mg by mouth daily. Daily 11/20/13   Historical Provider, MD  prochlorperazine (COMPAZINE) 10 MG tablet Take 1 tablet (10 mg total) by mouth every 6 (six) hours as needed (Nausea or vomiting). 02/22/14   Heath Lark, MD  Pseudoephedrine-APAP-DM (DAYQUIL PO) Take 30 mLs by mouth daily as needed (cold symptoms).     Historical Provider, MD  saccharomyces boulardii (FLORASTOR) 250 MG capsule Take 1 capsule (250 mg total) by mouth 2 (two) times daily. 04/22/14   Christina P Rama, MD   BP 145/77 mmHg  Pulse 96  Temp(Src) 99.1 F (37.3 C) (Oral)  Resp 20  SpO2 98% Physical Exam  Constitutional: He is oriented to person, place, and time. He appears well-developed and well-nourished.  HENT:  Head: Normocephalic and atraumatic.  Right Ear: External ear normal.  Left Ear: External ear normal.  Nose: Nose normal.  Mouth/Throat: Oropharynx is clear and moist. No oropharyngeal exudate.  Eyes: Right eye exhibits no discharge. Left eye exhibits no discharge.  Neck: Neck supple.  Cardiovascular: Normal rate, regular rhythm, normal heart sounds and intact distal pulses.   Pulmonary/Chest: Effort normal.  Abdominal: Soft. He exhibits no distension. There is no tenderness.  Musculoskeletal: He exhibits no edema.  Neurological: He is alert and oriented to person, place, and time.  Skin: Skin is warm and dry.  Nursing note and vitals reviewed.   ED Course  Procedures (including critical care time) Labs Review Labs Reviewed  COMPREHENSIVE METABOLIC PANEL - Abnormal; Notable for the following:    Glucose,  Bld 182 (*)    GFR calc non Af Amer 69 (*)    GFR calc Af Amer 80 (*)    All other components within normal limits  CBC WITH DIFFERENTIAL/PLATELET - Abnormal; Notable for the following:    RBC 3.66 (*)    Hemoglobin 11.3 (*)    HCT 33.9 (*)    RDW 15.8 (*)    Monocytes Relative 20 (*)    Lymphs Abs 0.6 (*)    All other components within normal limits  I-STAT CG4 LACTIC ACID, ED - Abnormal; Notable for the following:    Lactic Acid, Venous 2.10 (*)    All other components within normal limits  CULTURE, BLOOD (ROUTINE X 2)  CULTURE, BLOOD (ROUTINE X 2)  URINE CULTURE  URINALYSIS, ROUTINE W REFLEX MICROSCOPIC  URINE RAPID DRUG SCREEN (HOSP PERFORMED)  Imaging Review Dg Chest Port 1 View  05/04/2014   CLINICAL DATA:  Patient with multiple myeloma all presenting with fever  EXAM: PORTABLE CHEST - 1 VIEW  COMPARISON:  April 19, 2014  FINDINGS: There is bibasilar atelectatic change. There is no frank edema or consolidation. Heart size and pulmonary vascularity are normal. No adenopathy. There are multiple small lytic lesions in both clavicles and superior scapulae as well as in multiple ribs consistent with multiple myeloma.  IMPRESSION: Bony evidence of multiple myeloma. Bibasilar atelectatic change, more on the left than on the right. No consolidation. No new opacity.   Electronically Signed   By: Lowella Grip III M.D.   On: 05/04/2014 20:58     EKG Interpretation None      MDM   Final diagnoses:  Fever in adult    Patient is well-appearing here, no leukocytosis or neutropenia. Lactate is mildly elevated at 2.1. Patient family are very uncomfortable with going home given his prior history of requiring multiple hospitalizations for bacteremia. Given this blood cultures were obtained and will admit to the hospitalist. Hospital is requesting to hold on antibiotics until they see the patient given his comp case history and history of C. difficile.    Ephraim Hamburger,  MD 05/04/14 [phone]

## 2014-05-04 NOTE — ED Notes (Signed)
For CT scan prior to transfer to floor.

## 2014-05-04 NOTE — ED Notes (Signed)
MD at bedside. 

## 2014-05-04 NOTE — ED Notes (Signed)
Hospitalist at bedside 

## 2014-05-04 NOTE — H&P (Addendum)
PCP:  Gennette Pac, MD  [facility]  Chief Complaint:  Fatigue and fever HPI: Tyler Willis is a 64 y.o. male   has a past medical history of Diabetes mellitus without complication; GERD (gastroesophageal reflux disease); Cancer (2004); and Multiple myeloma (02/19/2014).   Presented with  Patient with hx of multiple myeloma. With recent c.diff colitis currently on flagyl. Last diarrhea was 1 week ago last normal BM was this AM. Patient had general fatigue and feeling of being unwell today. Family checked his temperature it was 100.5 wife gave him aleve and on arrival to ER it was down to 98.3 His diarrhea has been under better control he have not had any localizing symptoms, he got occasional cough but its unchanged. No dysuria. IN ER CXR was wnl, UA unremarkable his lactic acid was up to 2.1 but otherwise vitals have been stable. Wife reports having slight URI symptoms. On physical exam patient had right mid abdominal tenderness CT of abdomen was ordered showing no acute intra-abdominal findings but there is some new per phone a lot fractures on the right posterior lateral ninth and 10th ribs. No evidence of diverticulitis.   Hospitalist was called for admission for  Fever in cancer patient  Review of Systems:    Pertinent positives include:  Fevers, chills, fatigue,   Constitutional:  No weight loss, night sweats, weight loss  HEENT:  No headaches, Difficulty swallowing,Tooth/dental problems,Sore throat,  No sneezing, itching, ear ache, nasal congestion, post nasal drip,  Cardio-vascular:  No chest pain, Orthopnea, PND, anasarca, dizziness, palpitations.no Bilateral lower extremity swelling  GI:  No heartburn, indigestion, abdominal pain, nausea, vomiting, diarrhea, change in bowel habits, loss of appetite, melena, blood in stool, hematemesis Resp:  no shortness of breath at rest. No dyspnea on exertion, No excess mucus, no productive cough, No non-productive cough,  No coughing up of blood.No change in color of mucus.No wheezing. Skin:  no rash or lesions. No jaundice GU:  no dysuria, change in color of urine, no urgency or frequency. No straining to urinate.  No flank pain.  Musculoskeletal:  No joint pain or no joint swelling. No decreased range of motion. No back pain.  Psych:  No change in mood or affect. No depression or anxiety. No memory loss.  Neuro: no localizing neurological complaints, no tingling, no weakness, no double vision, no gait abnormality, no slurred speech, no confusion  Otherwise ROS are negative except for above, 10 systems were reviewed  Past Medical History: Past Medical History  Diagnosis Date  . Diabetes mellitus without complication   . GERD (gastroesophageal reflux disease)   . 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 Multiple myeloma 02/19/2014   Past Surgical History  Procedure Laterality Date  . Testicle removal Right 2004     Medications: Prior to Admission medications   Medication Sig Start Date End Date Taking? Authorizing Provider  CALCIUM PO Take 1,000 mg by mouth daily.   Yes Historical Provider, MD  Cholecalciferol (VITAMIN D3) 2000 UNITS TABS Take 1 tablet by mouth daily.    Yes Historical Provider, MD  folic acid (FOLVITE) 1 MG tablet Take 1 tablet (1 mg total) by mouth daily. 04/30/14  Yes Drue Second, NP  guaiFENesin (ROBITUSSIN) 100 MG/5ML SOLN Take 10 mLs (200 mg total) by mouth every 4 (four) hours as needed. 03/26/14  Yes Robbie Lis, MD  hydrocortisone (ANUSOL-HC) 2.5 % rectal cream Place rectally 2 (two) times daily. 03/26/14  Yes Robbie Lis, MD  insulin glargine (LANTUS) 100 UNIT/ML injection Inject 5-6 Units into the skin at bedtime. Take 5 units at Bedtime Everyday Except on Mondays Take 6 units.   Yes Historical Provider, MD  Magnesium 400 MG TABS Take 400 mg by mouth daily.   Yes Historical Provider, MD  metFORMIN (GLUCOPHAGE) 500 MG tablet Take 500 mg by mouth 2 (two) times daily with a meal.    Yes Historical Provider, MD  metroNIDAZOLE (FLAGYL) 500 MG tablet Take 1 tablet (500 mg total) by mouth every 8 (eight) hours. 04/22/14  Yes Venetia Maxon Rama, MD  Multiple Vitamin (MULTIVITAMIN) tablet Take 1 tablet by mouth daily.   Yes Historical Provider, MD  naproxen sodium (ANAPROX) 220 MG tablet Take 440 mg by mouth daily as needed (pain).    Yes Historical Provider, MD  omeprazole (PRILOSEC) 20 MG capsule Take 20 mg by mouth daily.   Yes Historical Provider, MD  oxyCODONE (OXY IR/ROXICODONE) 5 MG immediate release tablet Take 1 tablet (5 mg total) by mouth every 6 (six) hours as needed for severe pain. 04/26/14  Yes Heath Lark, MD  pravastatin (PRAVACHOL) 40 MG tablet Take 40 mg by mouth daily. Daily 11/20/13  Yes Historical Provider, MD  saccharomyces boulardii (FLORASTOR) 250 MG capsule Take 1 capsule (250 mg total) by mouth 2 (two) times daily. 04/22/14  Yes Venetia Maxon Rama, MD  acetaminophen (TYLENOL) 325 MG tablet Take 2 tablets (650 mg total) by mouth every 6 (six) hours as needed for mild pain (or Fever >/= 101). 03/26/14   Robbie Lis, MD  dexamethasone (DECADRON) 4 MG tablet Take 10 pills every Monday with breakfast 02/22/14   Heath Lark, MD  famotidine (PEPCID) 40 MG tablet Take 1 tablet (40 mg total) by mouth daily. Patient not taking: Reported on 05/04/2014 03/26/14   Robbie Lis, MD  prochlorperazine (COMPAZINE) 10 MG tablet Take 1 tablet (10 mg total) by mouth every 6 (six) hours as needed (Nausea or vomiting). 02/22/14   Heath Lark, MD  Pseudoephedrine-APAP-DM (DAYQUIL PO) Take 30 mLs by mouth daily as needed (cold symptoms).     Historical Provider, MD    Allergies:  No Known Allergies  Social History:  Ambulatory   independently   Lives at home  With family     reports that he has quit smoking. His smoking use included Pipe. He has never used smokeless tobacco. He reports that he does not drink alcohol or use illicit drugs.    Family History: family history includes  Cancer in his cousin and maternal uncle; Diabetes in his father.    Physical Exam: Patient Vitals for the past 24 hrs:  BP Temp Temp src Pulse Resp SpO2  05/04/14 2200 115/67 mmHg - - 79 - 94 %  05/04/14 2030 115/62 mmHg 98.3 F (36.8 C) Oral 80 17 95 %  05/04/14 1927 145/77 mmHg 99.1 F (37.3 C) Oral 96 20 98 %    1. General:  in No Acute distress 2. Psychological: Alert and   Oriented 3. Head/ENT:   Moist  Mucous Membranes                          Head Non traumatic, neck supple                          Normal  Dentition 4. SKIN:  decreased Skin turgor,  Skin clean Dry and intact no rash 5. Heart:  Regular rate and rhythm no Murmur, Rub or gallop 6. Lungs: Clear to auscultation bilaterally, no wheezes or crackles   7. Abdomen: Soft, right mid-abdomen tenderness, Non distended 8. Lower extremities: no clubbing, cyanosis, or edema 9. Neurologically Grossly intact, moving all 4 extremities equally 10. MSK: Normal range of motion  body mass index is unknown because there is no weight on file.   Labs on Admission:   Results for orders placed or performed during the hospital encounter of 05/04/14 (from the past 24 hour(s))  Comprehensive metabolic panel     Status: Abnormal   Collection Time: 05/04/14  7:51 PM  Result Value Ref Range   Sodium 137 135 - 145 mmol/L   Potassium 4.1 3.5 - 5.1 mmol/L   Chloride 105 96 - 112 mmol/L   CO2 25 19 - 32 mmol/L   Glucose, Bld 182 (H) 70 - 99 mg/dL   BUN 18 6 - 23 mg/dL   Creatinine, Ser 1.12 0.50 - 1.35 mg/dL   Calcium 8.6 8.4 - 10.5 mg/dL   Total Protein 6.9 6.0 - 8.3 g/dL   Albumin 3.8 3.5 - 5.2 g/dL   AST 30 0 - 37 U/L   ALT 26 0 - 53 U/L   Alkaline Phosphatase 61 39 - 117 U/L   Total Bilirubin 0.3 0.3 - 1.2 mg/dL   GFR calc non Af Amer 69 (L) >90 mL/min   GFR calc Af Amer 80 (L) >90 mL/min   Anion gap 7 5 - 15  CBC with Differential     Status: Abnormal   Collection Time: 05/04/14  7:51 PM  Result Value Ref Range   WBC 4.9  4.0 - 10.5 K/uL   RBC 3.66 (L) 4.22 - 5.81 MIL/uL   Hemoglobin 11.3 (L) 13.0 - 17.0 g/dL   HCT 33.9 (L) 39.0 - 52.0 %   MCV 92.6 78.0 - 100.0 fL   MCH 30.9 26.0 - 34.0 pg   MCHC 33.3 30.0 - 36.0 g/dL   RDW 15.8 (H) 11.5 - 15.5 %   Platelets 227 150 - 400 K/uL   Neutrophils Relative % 67 43 - 77 %   Lymphocytes Relative 12 12 - 46 %   Monocytes Relative 20 (H) 3 - 12 %   Eosinophils Relative 1 0 - 5 %   Basophils Relative 0 0 - 1 %   Neutro Abs 3.2 1.7 - 7.7 K/uL   Lymphs Abs 0.6 (L) 0.7 - 4.0 K/uL   Monocytes Absolute 1.0 0.1 - 1.0 K/uL   Eosinophils Absolute 0.1 0.0 - 0.7 K/uL   Basophils Absolute 0.0 0.0 - 0.1 K/uL   RBC Morphology POLYCHROMASIA PRESENT    WBC Morphology MILD LEFT SHIFT (1-5% METAS, OCC MYELO, OCC BANDS)    Smear Review LARGE PLATELETS PRESENT   I-Stat CG4 Lactic Acid, ED     Status: Abnormal   Collection Time: 05/04/14  8:16 PM  Result Value Ref Range   Lactic Acid, Venous 2.10 (HH) 0.5 - 2.0 mmol/L   Comment NOTIFIED PHYSICIAN   Urinalysis, Routine w reflex microscopic     Status: None   Collection Time: 05/04/14  8:19 PM  Result Value Ref Range   Color, Urine YELLOW YELLOW   APPearance CLEAR CLEAR   Specific Gravity, Urine 1.006 1.005 - 1.030   pH 5.0 5.0 - 8.0   Glucose, UA NEGATIVE NEGATIVE mg/dL   Hgb urine dipstick NEGATIVE NEGATIVE   Bilirubin Urine NEGATIVE NEGATIVE   Ketones, ur NEGATIVE  NEGATIVE mg/dL   Protein, ur NEGATIVE NEGATIVE mg/dL   Urobilinogen, UA 0.2 0.0 - 1.0 mg/dL   Nitrite NEGATIVE NEGATIVE   Leukocytes, UA NEGATIVE NEGATIVE  Urine rapid drug screen (hosp performed)     Status: None   Collection Time: 05/04/14  8:19 PM  Result Value Ref Range   Opiates NONE DETECTED NONE DETECTED   Cocaine NONE DETECTED NONE DETECTED   Benzodiazepines NONE DETECTED NONE DETECTED   Amphetamines NONE DETECTED NONE DETECTED   Tetrahydrocannabinol NONE DETECTED NONE DETECTED   Barbiturates NONE DETECTED NONE DETECTED    UA no uti  Lab  Results  Component Value Date   HGBA1C 6.4* 03/23/2014    Estimated Creatinine Clearance: 76 mL/min (by C-G formula based on Cr of 1.12).  BNP (last 3 results) No results for input(s): PROBNP in the last 8760 hours.  Other results:  I have pearsonaly reviewed this: ECG not obtained  There were no vitals filed for this visit.   Cultures:    Component Value Date/Time   SDES URINE, CLEAN CATCH 04/19/2014 2152   SPECREQUEST Normal 04/19/2014 2152   CULT NO GROWTH Performed at [facility]  04/19/2014 2152   REPTSTATUS 04/20/2014 FINAL 04/19/2014 2152     Radiological Exams on Admission: Dg Chest Port 1 View  05/04/2014   CLINICAL DATA:  Patient with multiple myeloma all presenting with fever  EXAM: PORTABLE CHEST - 1 VIEW  COMPARISON:  April 19, 2014  FINDINGS: There is bibasilar atelectatic change. There is no frank edema or consolidation. Heart size and pulmonary vascularity are normal. No adenopathy. There are multiple small lytic lesions in both clavicles and superior scapulae as well as in multiple ribs consistent with multiple myeloma.  IMPRESSION: Bony evidence of multiple myeloma. Bibasilar atelectatic change, more on the left than on the right. No consolidation. No new opacity.   Electronically Signed   By: Lowella Grip III M.D.   On: 05/04/2014 20:58    Chart has been reviewed  Assessment/Plan  64 year old gentleman with multiple myeloma presents with low-grade fever of unclear serology no localizing complaints. Had had extensive workup including UA, chest x-ray and CT scan of abdomen also follow unremarkable. Been admitted for evaluation and observation given his diminished immunological status.  Present on Admission:  . Fever - unclear etiology given the elevated lactic acid will admit for observation. Given recent C. difficile colitis we will hold off on additional antibiotics. Patient showing no evidence of sepsis or SIRS at this point, will observe and  repeat lactic acid in AM.  . Multiple myeloma - followed by Dr. Alvy Bimler would need to notify them in the morning patient is here  . Colitis, Clostridium difficile - continue Flagyl currently stable     Prophylaxis:   Lovenox, Protonix  CODE STATUS:  FULL CODE    Other plan as per orders.  I have spent a total of 55 min on this admission  Catheline Hixon 05/04/2014, 11:16 PM  [facility]  Pager [phone]   after 2 AM please page floor coverage PA If 7AM-7PM, please contact the day team taking care of the patient  [email or web address]  Password [email or web address]

## 2014-05-05 ENCOUNTER — Observation Stay (HOSPITAL_COMMUNITY): Payer: BC Managed Care – PPO

## 2014-05-05 DIAGNOSIS — E119 Type 2 diabetes mellitus without complications: Secondary | ICD-10-CM | POA: Diagnosis present

## 2014-05-05 DIAGNOSIS — E859 Amyloidosis, unspecified: Secondary | ICD-10-CM

## 2014-05-05 DIAGNOSIS — A047 Enterocolitis due to Clostridium difficile: Secondary | ICD-10-CM | POA: Diagnosis present

## 2014-05-05 DIAGNOSIS — G893 Neoplasm related pain (acute) (chronic): Secondary | ICD-10-CM | POA: Diagnosis present

## 2014-05-05 DIAGNOSIS — Z87891 Personal history of nicotine dependence: Secondary | ICD-10-CM | POA: Diagnosis not present

## 2014-05-05 DIAGNOSIS — C9000 Multiple myeloma not having achieved remission: Secondary | ICD-10-CM | POA: Diagnosis present

## 2014-05-05 DIAGNOSIS — R10819 Abdominal tenderness, unspecified site: Secondary | ICD-10-CM | POA: Diagnosis present

## 2014-05-05 DIAGNOSIS — K219 Gastro-esophageal reflux disease without esophagitis: Secondary | ICD-10-CM | POA: Diagnosis present

## 2014-05-05 DIAGNOSIS — A419 Sepsis, unspecified organism: Secondary | ICD-10-CM

## 2014-05-05 LAB — COMPREHENSIVE METABOLIC PANEL
ALT: 24 U/L (ref 0–53)
AST: 29 U/L (ref 0–37)
Albumin: 3.7 g/dL (ref 3.5–5.2)
Alkaline Phosphatase: 58 U/L (ref 39–117)
Anion gap: 6 (ref 5–15)
BUN: 14 mg/dL (ref 6–23)
CALCIUM: 8.3 mg/dL — AB (ref 8.4–10.5)
CO2: 27 mmol/L (ref 19–32)
Chloride: 105 mmol/L (ref 96–112)
Creatinine, Ser: 1.03 mg/dL (ref 0.50–1.35)
GFR calc Af Amer: 89 mL/min — ABNORMAL LOW (ref >90)
GFR, EST NON AFRICAN AMERICAN: 76 mL/min — AB (ref >90)
Glucose, Bld: 94 mg/dL (ref 70–99)
Potassium: 4 mmol/L (ref 3.5–5.1)
SODIUM: 138 mmol/L (ref 135–145)
TOTAL PROTEIN: 6.8 g/dL (ref 6.0–8.3)
Total Bilirubin: 0.6 mg/dL (ref 0.3–1.2)

## 2014-05-05 LAB — CBC
HCT: 35 % — ABNORMAL LOW (ref 39.0–52.0)
HEMOGLOBIN: 11.4 g/dL — AB (ref 13.0–17.0)
MCH: 30.2 pg (ref 26.0–34.0)
MCHC: 32.6 g/dL (ref 30.0–36.0)
MCV: 92.8 fL (ref 78.0–100.0)
Platelets: 201 10*3/uL (ref 150–400)
RBC: 3.77 MIL/uL — ABNORMAL LOW (ref 4.22–5.81)
RDW: 15.8 % — ABNORMAL HIGH (ref 11.5–15.5)
WBC: 3.6 10*3/uL — ABNORMAL LOW (ref 4.0–10.5)

## 2014-05-05 LAB — GLUCOSE, CAPILLARY
GLUCOSE-CAPILLARY: 103 mg/dL — AB (ref 70–99)
GLUCOSE-CAPILLARY: 100 mg/dL — AB (ref 70–99)
Glucose-Capillary: 81 mg/dL (ref 70–99)
Glucose-Capillary: 166 mg/dL — ABNORMAL HIGH (ref 70–99)

## 2014-05-05 LAB — PHOSPHORUS: Phosphorus: 3.3 mg/dL (ref 2.3–4.6)

## 2014-05-05 LAB — TSH: TSH: 3.747 u[IU]/mL (ref 0.350–4.500)

## 2014-05-05 LAB — MAGNESIUM: Magnesium: 2.3 mg/dL (ref 1.5–2.5)

## 2014-05-05 MED ORDER — ONDANSETRON HCL 4 MG PO TABS: 4.0000 mg | ORAL_TABLET | Freq: Four times a day (QID) | ORAL | Status: DC | PRN

## 2014-05-05 MED ORDER — ENOXAPARIN SODIUM 40 MG/0.4ML ~~LOC~~ SOLN
40.0000 mg | SUBCUTANEOUS | Status: DC
  Administered 2014-05-05: 40 mg via SUBCUTANEOUS
  Filled 2014-05-05: qty 0.4

## 2014-05-05 MED ORDER — INSULIN ASPART 100 UNIT/ML ~~LOC~~ SOLN: 0.0000 [IU] | SUBCUTANEOUS | Status: DC

## 2014-05-05 MED ORDER — SACCHAROMYCES BOULARDII 250 MG PO CAPS
250.0000 mg | ORAL_CAPSULE | Freq: Two times a day (BID) | ORAL | Status: DC
  Administered 2014-05-05 (×2): 250 mg via ORAL
  Filled 2014-05-05 (×3): qty 1

## 2014-05-05 MED ORDER — ONDANSETRON HCL 4 MG/2ML IJ SOLN: 4.0000 mg | Freq: Four times a day (QID) | INTRAMUSCULAR | Status: DC | PRN

## 2014-05-05 MED ORDER — PANTOPRAZOLE SODIUM 40 MG PO TBEC: 40.0000 mg | DELAYED_RELEASE_TABLET | Freq: Every day | ORAL | Status: DC

## 2014-05-05 MED ORDER — INSULIN ASPART 100 UNIT/ML ~~LOC~~ SOLN: 0.0000 [IU] | Freq: Every day | SUBCUTANEOUS | Status: DC

## 2014-05-05 MED ORDER — PRAVASTATIN SODIUM 40 MG PO TABS
40.0000 mg | ORAL_TABLET | Freq: Every day | ORAL | Status: DC
  Administered 2014-05-05: 40 mg via ORAL
  Filled 2014-05-05: qty 1

## 2014-05-05 MED ORDER — ACETAMINOPHEN 650 MG RE SUPP: 650.0000 mg | Freq: Four times a day (QID) | RECTAL | Status: DC | PRN

## 2014-05-05 MED ORDER — INSULIN ASPART 100 UNIT/ML ~~LOC~~ SOLN
0.0000 [IU] | Freq: Three times a day (TID) | SUBCUTANEOUS | Status: DC
  Administered 2014-05-05: 2 [IU] via SUBCUTANEOUS

## 2014-05-05 MED ORDER — HYDROCORTISONE 2.5 % RE CREA
TOPICAL_CREAM | Freq: Two times a day (BID) | RECTAL | Status: DC
Start: 2014-05-05 — End: 2014-05-05
  Administered 2014-05-05: 10:00:00 via RECTAL
  Filled 2014-05-05: qty 28.35

## 2014-05-05 MED ORDER — OXYCODONE HCL 5 MG PO TABS: 5.0000 mg | ORAL_TABLET | Freq: Four times a day (QID) | ORAL | Status: DC | PRN

## 2014-05-05 MED ORDER — IOHEXOL 300 MG/ML  SOLN
100.0000 mL | Freq: Once | INTRAMUSCULAR | Status: AC | PRN
  Administered 2014-05-05: 100 mL via INTRAVENOUS

## 2014-05-05 MED ORDER — METRONIDAZOLE 500 MG PO TABS
500.0000 mg | ORAL_TABLET | Freq: Three times a day (TID) | ORAL | Status: DC
  Administered 2014-05-05 (×2): 500 mg via ORAL
  Filled 2014-05-05 (×4): qty 1

## 2014-05-05 MED ORDER — ACETAMINOPHEN 325 MG PO TABS: 650.0000 mg | ORAL_TABLET | Freq: Four times a day (QID) | ORAL | Status: DC | PRN

## 2014-05-05 MED ORDER — IOHEXOL 300 MG/ML  SOLN: 50.0000 mL | Freq: Once | INTRAMUSCULAR | Status: DC | PRN

## 2014-05-05 MED ORDER — SODIUM CHLORIDE 0.9 % IV SOLN
INTRAVENOUS | Status: AC
  Administered 2014-05-05: 02:00:00 via INTRAVENOUS

## 2014-05-05 MED ORDER — HYDROCODONE-ACETAMINOPHEN 5-325 MG PO TABS: 1.0000 | ORAL_TABLET | ORAL | Status: DC | PRN

## 2014-05-05 NOTE — Discharge Summary (Addendum)
Physician Discharge Summary  [name]:[id number] DOB: 06/01/1950 DOA: 05/04/2014  PCP: Gennette Pac, MD  Admit date: 05/04/2014 Discharge date: 05/05/2014   Recommendations for Outpatient Follow-Up:   1. The patient has follow up with Dr. Alvy Bimler on 05/07/14.  Please F/U blood and urine cultures at that visit.   Discharge Diagnosis:   Principal Problem:    SIRS (systemic inflammatory response syndrome) versus tumor fevers Active Problems:    Multiple myeloma    Amyloidosis    Diabetes mellitus without complication    Fever    Colitis, Clostridium difficile    Left shoulder pain / cancer related pain   Discharge Condition: Improved.  Diet recommendation: Carbohydrate-modified.    History of Present Illness:   Tyler Willis is an 64 y.o. male with a PMH of diabetes, GERD, and multiple myeloma, recent treatment for C. difficile colitis on Flagyl who was admitted for observation 05/04/14 after presenting with fatigue and low-grade fever. Upon evaluation in the ER, chest x-ray was unremarkable, urinalysis negative for signs of infection but lactate was 2.1. CT of the abdomen negative for acute intra-abdominal findings.   Hospital Course by Problem:   Principal Problem:  SIRS (systemic inflammatory response syndrome) / fever  No obvious source of infection. Has had recurrent admissions with similar presentation. Fever may be related to myeloma rather than infection, and with his history of C. Difficile, would only treat with antibiotics if an infectious source identifies or the patient develops hemodynamic instability suggestive of distributive shock.  Afebrile overnight, hemodynamically stable.  D/C home with close follow up.  Active Problems:   Left shoulder pain/cancer related pain  Left shoulder films consistent with myeloma lesion.  Pain management per Dr. Alvy Bimler recommended.   Multiple myeloma / amyloidosis  Cytoxan has been placed on  hold per oncology during previous hospitalization.  Currently being treated with dexamethasone and Velcade only.   Diabetes mellitus without complication  Advance diet to carbohydrate modified.  SSI, insulin sensitive scale before meals and at bedtime ordered.  Resume Metformin and home insulin regimen at discharge.   Colitis, Clostridium difficile  Continue Flagyl.  Discontinue Protonix.   Medical Consultants:    None.   Discharge Exam:   Filed Vitals:   05/05/14 1434  BP: 126/78  Pulse: 73  Temp: 98 F (36.7 C)  Resp: 16   Filed Vitals:   05/05/14 0127 05/05/14 0415 05/05/14 0915 05/05/14 1434  BP: 135/81 124/68 128/76 126/78  Pulse: 72 68 64 73  Temp: 97.9 F (36.6 C) 98.1 F (36.7 C) 98 F (36.7 C) 98 F (36.7 C)  TempSrc: Oral Oral Oral Oral  Resp: 18 18 18 16   Height: 6' (1.829 m)     Weight: 87.9 kg (193 lb 12.6 oz)     SpO2: 96% 97% 98% 99%    Gen:  NAD Cardiovascular:  RRR, No M/R/G Respiratory: Lungs CTAB Gastrointestinal: Abdomen soft, NT/ND with normal active bowel sounds. Extremities: No C/E/C   The results of significant diagnostics from this hospitalization (including imaging, microbiology, ancillary and laboratory) are listed below for reference.     Procedures and Diagnostic Studies:   Ct Abdomen Pelvis W Contrast 05/05/2014: 1. No acute abnormality seen to explain the patient's symptoms. 2. Fractures of the right posterolateral ninth and tenth ribs, new from the prior study; previously noted rib fractures are seen slightly more laterally. These are likely pathologic in nature, as previously described. 3. Lytic lesion again noted at the right  side of vertebral body L2, reflecting multiple myeloma. 4. Scattered diverticulosis along the distal descending and sigmoid colon, without evidence of diverticulitis. Minimal wall thickening along the descending and proximal sigmoid colon may reflect recent C. difficile colitis, given the patient's  history. 5. Scattered small right renal cysts noted. 6. Few small hypodensities within the liver, measuring up to 1.2 cm. These are nonspecific but may reflect small cysts. 7. Mildly enlarged prostate noted. 8. Grade 2 anterolisthesis of L5 on S1, reflecting underlying facet disease. This is grossly stable from prior studies.     Dg Chest Port 1 View 05/04/2014: Bony evidence of multiple myeloma. Bibasilar atelectatic change, more on the left than on the right. No consolidation. No new opacity.   Electronically Signed   By: Lowella Grip III M.D.   On: 05/04/2014 20:58   Dg Shoulder Left 05/05/2014: Multiple myeloma involving the left shoulder. No evidence of acute shoulder fracture or dislocation.  Left posterior fifth and seventh rib fracture deformities, which are of indeterminate age.      Labs:   Basic Metabolic Panel:  Recent Labs Lab 05/03/14 0825 05/04/14 1951 05/05/14 0554  NA 139 137 138  K 3.6 4.1 4.0  CL  --  105 105  CO2 24 25 27   GLUCOSE 160* 182* 94  BUN 15.3 18 14   CREATININE 1.0 1.12 1.03  CALCIUM 8.8 8.6 8.3*  MG  --   --  2.3  PHOS  --   --  3.3   GFR Estimated Creatinine Clearance: 82.7 mL/min (by C-G formula based on Cr of 1.03). Liver Function Tests:  Recent Labs Lab 05/03/14 0825 05/04/14 1951 05/05/14 0554  AST 25 30 29   ALT 29 26 24   ALKPHOS 72 61 58  BILITOT 0.62 0.3 0.6  PROT 7.2 6.9 6.8  ALBUMIN 3.8 3.8 3.7   CBC:  Recent Labs Lab 05/03/14 0825 05/04/14 1951 05/05/14 0554  WBC 8.1 4.9 3.6*  NEUTROABS 4.7 3.2  --   HGB 12.2* 11.3* 11.4*  HCT 36.7* 33.9* 35.0*  MCV 92.7 92.6 92.8  PLT 276 227 201   CBG:  Recent Labs Lab 05/05/14 0135 05/05/14 0412 05/05/14 0744 05/05/14 1153 05/05/14 1156  GLUCAP 103* 100* 81 <10* 166*   Thyroid function studies  Recent Labs  05/05/14 0554  TSH 3.747   Microbiology No results found for this or any previous visit (from the past 240 hour(s)).   Discharge Instructions:    Discharge Instructions    Call MD for:  persistant nausea and vomiting    Complete by:  As directed      Call MD for:  severe uncontrolled pain    Complete by:  As directed      Call MD for:  temperature >100.4    Complete by:  As directed      Diet Carb Modified    Complete by:  As directed      Discharge instructions    Complete by:  As directed   Continue the remainder of your Flagyl.  No need to continue this past the stop date, but call your doctor if the diarrhea returns.     Increase activity slowly    Complete by:  As directed             Medication List    STOP taking these medications        famotidine 40 MG tablet  Commonly known as:  PEPCID      TAKE these  medications        acetaminophen 325 MG tablet  Commonly known as:  TYLENOL  Take 2 tablets (650 mg total) by mouth every 6 (six) hours as needed for mild pain (or Fever >/= 101).     CALCIUM PO  Take 1,000 mg by mouth daily.     DAYQUIL PO  Take 30 mLs by mouth daily as needed (cold symptoms).     dexamethasone 4 MG tablet  Commonly known as:  DECADRON  Take 10 pills every Monday with breakfast     folic acid 1 MG tablet  Commonly known as:  FOLVITE  Take 1 tablet (1 mg total) by mouth daily.     guaiFENesin 100 MG/5ML Soln  Commonly known as:  ROBITUSSIN  Take 10 mLs (200 mg total) by mouth every 4 (four) hours as needed.     hydrocortisone 2.5 % rectal cream  Commonly known as:  ANUSOL-HC  Place rectally 2 (two) times daily.     insulin glargine 100 UNIT/ML injection  Commonly known as:  LANTUS  Inject 5-6 Units into the skin at bedtime. Take 5 units at Bedtime Everyday Except on Mondays Take 6 units.     Magnesium 400 MG Tabs  Take 400 mg by mouth daily.     metFORMIN 500 MG tablet  Commonly known as:  GLUCOPHAGE  Take 500 mg by mouth 2 (two) times daily with a meal.     metroNIDAZOLE 500 MG tablet  Commonly known as:  FLAGYL  Take 1 tablet (500 mg total) by mouth every 8  (eight) hours.     multivitamin tablet  Take 1 tablet by mouth daily.     naproxen sodium 220 MG tablet  Commonly known as:  ANAPROX  Take 440 mg by mouth daily as needed (pain).     omeprazole 20 MG capsule  Commonly known as:  PRILOSEC  Take 20 mg by mouth daily.     oxyCODONE 5 MG immediate release tablet  Commonly known as:  Oxy IR/ROXICODONE  Take 1 tablet (5 mg total) by mouth every 6 (six) hours as needed for severe pain.     pravastatin 40 MG tablet  Commonly known as:  PRAVACHOL  Take 40 mg by mouth daily. Daily     prochlorperazine 10 MG tablet  Commonly known as:  COMPAZINE  Take 1 tablet (10 mg total) by mouth every 6 (six) hours as needed (Nausea or vomiting).     saccharomyces boulardii 250 MG capsule  Commonly known as:  FLORASTOR  Take 1 capsule (250 mg total) by mouth 2 (two) times daily.     Vitamin D3 2000 UNITS Tabs  Take 1 tablet by mouth daily.           Follow-up Information    Follow up with Gennette Pac, MD.   Specialty:  Family Medicine   Why:  If symptoms worsen   Contact information:   [place] [address] [phone]       Follow up with [facility], NI, MD.   Specialty:  Hematology and Oncology   Why:  Next week, at your scheduled appt time noted below.   Contact information:   [place] [address] [phone]        Time coordinating discharge: 35 minutes.  Signed:  RAMA,CHRISTINA  Pager [phone] [facility] 05/05/2014, 2:37 PM

## 2014-05-05 NOTE — ED Notes (Signed)
Patient transported to CT 

## 2014-05-05 NOTE — Progress Notes (Signed)
Pt discharged to home. DC instructions given. No concerns voiced. Left unit in wheelchair pushed by nurse tech accompanied by wife and daughter. Left in good condition. Vwilliams,rn.

## 2014-05-05 NOTE — Progress Notes (Signed)
Utilization Review completed.  

## 2014-05-06 LAB — URINE CULTURE
Colony Count: NO GROWTH
Culture: NO GROWTH

## 2014-05-07 ENCOUNTER — Ambulatory Visit (HOSPITAL_BASED_OUTPATIENT_CLINIC_OR_DEPARTMENT_OTHER): Payer: BC Managed Care – PPO

## 2014-05-07 ENCOUNTER — Other Ambulatory Visit (HOSPITAL_BASED_OUTPATIENT_CLINIC_OR_DEPARTMENT_OTHER): Payer: BC Managed Care – PPO

## 2014-05-07 DIAGNOSIS — Z5112 Encounter for antineoplastic immunotherapy: Secondary | ICD-10-CM

## 2014-05-07 DIAGNOSIS — C9000 Multiple myeloma not having achieved remission: Secondary | ICD-10-CM

## 2014-05-07 LAB — COMPREHENSIVE METABOLIC PANEL (CC13)
ALT: 31 U/L (ref 0–55)
AST: 35 U/L — ABNORMAL HIGH (ref 5–34)
Albumin: 3.8 g/dL (ref 3.5–5.0)
Alkaline Phosphatase: 64 U/L (ref 40–150)
Anion Gap: 9 mEq/L (ref 3–11)
BUN: 16.1 mg/dL (ref 7.0–26.0)
CALCIUM: 9.1 mg/dL (ref 8.4–10.4)
CO2: 22 mEq/L (ref 22–29)
CREATININE: 1 mg/dL (ref 0.7–1.3)
Chloride: 107 mEq/L (ref 98–109)
EGFR: 81 mL/min/{1.73_m2} — ABNORMAL LOW (ref >90)
Glucose: 155 mg/dl — ABNORMAL HIGH (ref 70–140)
Potassium: 4 mEq/L (ref 3.5–5.1)
Sodium: 138 mEq/L (ref 136–145)
Total Bilirubin: 0.39 mg/dL (ref 0.20–1.20)
Total Protein: 7.2 g/dL (ref 6.4–8.3)

## 2014-05-07 LAB — HEMOGLOBIN A1C
HEMOGLOBIN A1C: 6.3 % — AB (ref 4.8–5.6)
Mean Plasma Glucose: 134 mg/dL

## 2014-05-07 LAB — CBC WITH DIFFERENTIAL/PLATELET
BASO%: 0.2 % (ref 0.0–2.0)
Basophils Absolute: 0 10*3/uL (ref 0.0–0.1)
EOS%: 3.2 % (ref 0.0–7.0)
Eosinophils Absolute: 0.1 10*3/uL (ref 0.0–0.5)
HCT: 37.3 % — ABNORMAL LOW (ref 38.4–49.9)
HGB: 12.4 g/dL — ABNORMAL LOW (ref 13.0–17.1)
LYMPH%: 46.7 % (ref 14.0–49.0)
MCH: 30.7 pg (ref 27.2–33.4)
MCHC: 33.2 g/dL (ref 32.0–36.0)
MCV: 92.3 fL (ref 79.3–98.0)
MONO#: 0.7 10*3/uL (ref 0.1–0.9)
MONO%: 18 % — ABNORMAL HIGH (ref 0.0–14.0)
NEUT#: 1.3 10*3/uL — ABNORMAL LOW (ref 1.5–6.5)
NEUT%: 31.9 % — ABNORMAL LOW (ref 39.0–75.0)
Platelets: 150 10*3/uL (ref 140–400)
RBC: 4.04 10*6/uL — AB (ref 4.20–5.82)
RDW: 16 % — ABNORMAL HIGH (ref 11.0–14.6)
WBC: 4.1 10*3/uL (ref 4.0–10.3)
lymph#: 1.9 10*3/uL (ref 0.9–3.3)

## 2014-05-07 LAB — GLUCOSE, CAPILLARY: Glucose-Capillary: <10 mg/dL — CL (ref 70–99)

## 2014-05-07 MED ORDER — ONDANSETRON HCL 8 MG PO TABS
ORAL_TABLET | ORAL | Status: AC
  Filled 2014-05-07: qty 1

## 2014-05-07 MED ORDER — ONDANSETRON HCL 8 MG PO TABS
8.0000 mg | ORAL_TABLET | Freq: Once | ORAL | Status: AC
  Administered 2014-05-07: 8 mg via ORAL

## 2014-05-07 MED ORDER — BORTEZOMIB CHEMO SQ INJECTION 3.5 MG (2.5MG/ML)
1.3000 mg/m2 | Freq: Once | INTRAMUSCULAR | Status: AC
  Administered 2014-05-07: 2.75 mg via SUBCUTANEOUS
  Filled 2014-05-07: qty 2.75

## 2014-05-07 NOTE — Progress Notes (Signed)
Ok to treat today with neutrophils 1.3 per Dr Alvy Bimler

## 2014-05-07 NOTE — Patient Instructions (Signed)
[facility] Discharge Instructions for Patients Receiving Chemotherapy  Today you received the following chemotherapy agents velcade   To help prevent nausea and vomiting after your treatment, we encourage you to take your nausea medication as directed  If you develop nausea and vomiting that is not controlled by your nausea medication, call the clinic.   BELOW ARE SYMPTOMS THAT SHOULD BE REPORTED IMMEDIATELY:  *FEVER GREATER THAN 100.5 F  *CHILLS WITH OR WITHOUT FEVER  NAUSEA AND VOMITING THAT IS NOT CONTROLLED WITH YOUR NAUSEA MEDICATION  *UNUSUAL SHORTNESS OF BREATH  *UNUSUAL BRUISING OR BLEEDING  TENDERNESS IN MOUTH AND THROAT WITH OR WITHOUT PRESENCE OF ULCERS  *URINARY PROBLEMS  *BOWEL PROBLEMS  UNUSUAL RASH Items with * indicate a potential emergency and should be followed up as soon as possible.  Feel free to call the clinic you have any questions or concerns. The clinic phone number is [phone].  

## 2014-05-09 LAB — SPEP & IFE WITH QIG
ALPHA-1-GLOBULIN: 5.6 % — AB (ref 2.9–4.9)
Albumin ELP: 55.2 % — ABNORMAL LOW (ref 55.8–66.1)
Alpha-2-Globulin: 12.3 % — ABNORMAL HIGH (ref 7.1–11.8)
BETA 2: 2.3 % — AB (ref 3.2–6.5)
Beta Globulin: 5.1 % (ref 4.7–7.2)
Gamma Globulin: 19.5 % — ABNORMAL HIGH (ref 11.1–18.8)
IGM, SERUM: 9 mg/dL — AB (ref 41–251)
IgA: 10 mg/dL — ABNORMAL LOW (ref 68–379)
IgG (Immunoglobin G), Serum: 1600 mg/dL (ref 650–1600)
M-Spike, %: 1.11 g/dL
TOTAL PROTEIN, SERUM ELECTROPHOR: 7 g/dL (ref 6.0–8.3)

## 2014-05-09 LAB — BETA 2 MICROGLOBULIN, SERUM: Beta-2 Microglobulin: 5.31 mg/L — ABNORMAL HIGH (ref <=2.51)

## 2014-05-09 LAB — KAPPA/LAMBDA LIGHT CHAINS
KAPPA FREE LGHT CHN: 13.2 mg/dL — AB (ref 0.33–1.94)
Kappa:Lambda Ratio: 25.88 — ABNORMAL HIGH (ref 0.26–1.65)
Lambda Free Lght Chn: 0.51 mg/dL — ABNORMAL LOW (ref 0.57–2.63)

## 2014-05-10 ENCOUNTER — Ambulatory Visit (HOSPITAL_BASED_OUTPATIENT_CLINIC_OR_DEPARTMENT_OTHER): Payer: BC Managed Care – PPO

## 2014-05-10 DIAGNOSIS — Z5112 Encounter for antineoplastic immunotherapy: Secondary | ICD-10-CM

## 2014-05-10 DIAGNOSIS — C9000 Multiple myeloma not having achieved remission: Secondary | ICD-10-CM

## 2014-05-10 MED ORDER — ONDANSETRON HCL 8 MG PO TABS
8.0000 mg | ORAL_TABLET | Freq: Once | ORAL | Status: AC
  Administered 2014-05-10: 8 mg via ORAL

## 2014-05-10 MED ORDER — BORTEZOMIB CHEMO SQ INJECTION 3.5 MG (2.5MG/ML)
1.3000 mg/m2 | Freq: Once | INTRAMUSCULAR | Status: AC
  Administered 2014-05-10: 2.75 mg via SUBCUTANEOUS
  Filled 2014-05-10: qty 2.75

## 2014-05-10 MED ORDER — ONDANSETRON HCL 8 MG PO TABS
ORAL_TABLET | ORAL | Status: AC
  Filled 2014-05-10: qty 1

## 2014-05-10 MED ORDER — DEXAMETHASONE 4 MG PO TABS
ORAL_TABLET | ORAL | Status: AC
  Filled 2014-05-10: qty 1

## 2014-05-10 MED ORDER — DEXAMETHASONE 4 MG PO TABS: 4.0000 mg | ORAL_TABLET | Freq: Once | ORAL | Status: DC

## 2014-05-10 NOTE — Patient Instructions (Signed)
[facility] Discharge Instructions for Patients Receiving Chemotherapy  Today you received the following chemotherapy agents velcade   To help prevent nausea and vomiting after your treatment, we encourage you to take your nausea medication as directed  If you develop nausea and vomiting that is not controlled by your nausea medication, call the clinic.   BELOW ARE SYMPTOMS THAT SHOULD BE REPORTED IMMEDIATELY:  *FEVER GREATER THAN 100.5 F  *CHILLS WITH OR WITHOUT FEVER  NAUSEA AND VOMITING THAT IS NOT CONTROLLED WITH YOUR NAUSEA MEDICATION  *UNUSUAL SHORTNESS OF BREATH  *UNUSUAL BRUISING OR BLEEDING  TENDERNESS IN MOUTH AND THROAT WITH OR WITHOUT PRESENCE OF ULCERS  *URINARY PROBLEMS  *BOWEL PROBLEMS  UNUSUAL RASH Items with * indicate a potential emergency and should be followed up as soon as possible.  Feel free to call the clinic you have any questions or concerns. The clinic phone number is [phone].  

## 2014-05-11 LAB — CULTURE, BLOOD (ROUTINE X 2)
CULTURE: NO GROWTH
Culture: NO GROWTH

## 2014-05-14 ENCOUNTER — Encounter: Payer: Self-pay | Admitting: Hematology and Oncology

## 2014-05-14 NOTE — Progress Notes (Signed)
Faxed chemo charges to [employer] for patient's cancer policy

## 2014-05-15 ENCOUNTER — Other Ambulatory Visit: Payer: Self-pay | Admitting: *Deleted

## 2014-05-15 ENCOUNTER — Telehealth: Payer: Self-pay | Admitting: *Deleted

## 2014-05-15 NOTE — Telephone Encounter (Signed)
Patient called ask whether he should have labs prior to his appointment with Dr. Alvy Bimler on 05/17/14.  No lab appointment is scheduled.

## 2014-05-15 NOTE — Telephone Encounter (Signed)
Just add lab appt and I wll take care of the orders

## 2014-05-17 ENCOUNTER — Other Ambulatory Visit (HOSPITAL_BASED_OUTPATIENT_CLINIC_OR_DEPARTMENT_OTHER): Payer: BC Managed Care – PPO

## 2014-05-17 ENCOUNTER — Telehealth: Payer: Self-pay | Admitting: *Deleted

## 2014-05-17 ENCOUNTER — Ambulatory Visit (HOSPITAL_BASED_OUTPATIENT_CLINIC_OR_DEPARTMENT_OTHER): Payer: BC Managed Care – PPO | Admitting: Hematology and Oncology

## 2014-05-17 ENCOUNTER — Telehealth: Payer: Self-pay | Admitting: Hematology and Oncology

## 2014-05-17 VITALS — BP 136/76 | HR 73 | Temp 96.7°F | Resp 18 | Ht 72.0 in | Wt 189.7 lb

## 2014-05-17 DIAGNOSIS — C9000 Multiple myeloma not having achieved remission: Secondary | ICD-10-CM

## 2014-05-17 DIAGNOSIS — D801 Nonfamilial hypogammaglobulinemia: Secondary | ICD-10-CM

## 2014-05-17 DIAGNOSIS — E859 Amyloidosis, unspecified: Secondary | ICD-10-CM

## 2014-05-17 LAB — COMPREHENSIVE METABOLIC PANEL (CC13)
ALT: 26 U/L (ref 0–55)
AST: 30 U/L (ref 5–34)
Albumin: 4 g/dL (ref 3.5–5.0)
Alkaline Phosphatase: 70 U/L (ref 40–150)
Anion Gap: 6 mEq/L (ref 3–11)
BILIRUBIN TOTAL: 0.87 mg/dL (ref 0.20–1.20)
BUN: 15.4 mg/dL (ref 7.0–26.0)
CO2: 27 meq/L (ref 22–29)
Calcium: 9.6 mg/dL (ref 8.4–10.4)
Chloride: 105 mEq/L (ref 98–109)
Creatinine: 1.1 mg/dL (ref 0.7–1.3)
EGFR: 71 mL/min/{1.73_m2} — ABNORMAL LOW (ref >90)
Glucose: 83 mg/dl (ref 70–140)
Potassium: 3.9 mEq/L (ref 3.5–5.1)
SODIUM: 138 meq/L (ref 136–145)
Total Protein: 7.7 g/dL (ref 6.4–8.3)

## 2014-05-17 LAB — CBC WITH DIFFERENTIAL/PLATELET
BASO%: 0.1 % (ref 0.0–2.0)
BASOS ABS: 0 10*3/uL (ref 0.0–0.1)
EOS%: 1.8 % (ref 0.0–7.0)
Eosinophils Absolute: 0.1 10*3/uL (ref 0.0–0.5)
HEMATOCRIT: 38.7 % (ref 38.4–49.9)
HEMOGLOBIN: 13 g/dL (ref 13.0–17.1)
LYMPH#: 2 10*3/uL (ref 0.9–3.3)
LYMPH%: 27 % (ref 14.0–49.0)
MCH: 31.2 pg (ref 27.2–33.4)
MCHC: 33.6 g/dL (ref 32.0–36.0)
MCV: 92.8 fL (ref 79.3–98.0)
MONO#: 1.7 10*3/uL — AB (ref 0.1–0.9)
MONO%: 22.5 % — ABNORMAL HIGH (ref 0.0–14.0)
NEUT#: 3.6 10*3/uL (ref 1.5–6.5)
NEUT%: 48.6 % (ref 39.0–75.0)
Platelets: 188 10*3/uL (ref 140–400)
RBC: 4.17 10*6/uL — ABNORMAL LOW (ref 4.20–5.82)
RDW: 15.6 % — AB (ref 11.0–14.6)
WBC: 7.3 10*3/uL (ref 4.0–10.3)

## 2014-05-17 MED ORDER — LENALIDOMIDE 10 MG PO CAPS: 10.0000 mg | ORAL_CAPSULE | Freq: Every day | ORAL | Status: DC

## 2014-05-17 NOTE — Telephone Encounter (Signed)
Per staff message and POF I have scheduled appts. Advised scheduler of appts. JMW  

## 2014-05-17 NOTE — Assessment & Plan Note (Signed)
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 I recommend IVIG monthly to prevent risk of infection.

## 2014-05-17 NOTE — Telephone Encounter (Signed)
Gave avs & calendar for March. Sent message to schedule treatment. °

## 2014-05-17 NOTE — Assessment & Plan Note (Signed)
I told the patient they are available data of using combination of Revlimid, Velcade and dexamethasone to treat amyloidosis. His M spike protein is still greater than 1 g. The patient will continue treatment for now and once I can document good response to treatment, I will refer him to his transplant physician for bone marrow transplant in the near future

## 2014-05-17 NOTE — Assessment & Plan Note (Signed)
I had a long discussion with the patient and his wife. We reviewed the most recent test result. The patient had achieved only partial response, complicated by for hospitalization related to infection. I recommend we switch treatment. I will discontinue Cytoxan but substituted with Revlimid. I plan to give him Velcade subcutaneous injection on days 1, 4, 7 and 11 of cycle every 21 days along with Revlimid 10 mg days 1-14, cycle every 21 days. He will take reduced dose dexamethasone 20 mg weekly. He will continue Zometa monthly. We discussed the role of chemotherapy. The intent is for palliative.  We discussed some of the risks, benefits, side-effects of Revlimid.   Some of the short term side-effects included, though not limited to, risk of fatigue, weight loss, pancytopenia, life-threatening infections, need for transfusions of blood products, nausea, vomiting, change in bowel habits, blood clots, admission to hospital for various reasons, and risks of death.   Long term side-effects are also discussed including risks of infertility, permanent damage to nerve function, chronic fatigue, and rare secondary malignancy including bone marrow disorders such as acute leukemia.   The patient is aware that the response rates discussed earlier is not guaranteed.    After a long discussion, patient made an informed decision to proceed with the prescribed plan of care and went ahead to sign the consent form today.   Patient education material was dispensed I plan to get him started on treatment next week.

## 2014-05-17 NOTE — Progress Notes (Signed)
[facility] OFFICE PROGRESS NOTE  Patient Care Team: Hulan Fess, MD as PCP - General (Family Medicine) Heath Lark, MD as Consulting Physician (Hematology and Oncology)  SUMMARY OF ONCOLOGIC HISTORY: Oncology History   ISS Staging II, M-spike 1.7, IgG 2420, kappa 30.1, beta37mcroglobulin 4.48, Creatinine 1.2, calcium 9.8, hemoglobin 13.2, albumin 4.2     Multiple myeloma   01/10/2003 Pathology Results [id number] pathology showed pure seminoma with lymphovascular invasion, pT2 NX MX   01/10/2003 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 congo red staing reveals amyloid deposition within some of the amorphous material and within blood vessels. Normal cytogenetics.   02/14/2014 Imaging PET CT scan showed diffuse skeletal involvement   02/26/2014 - 05/10/2014 Chemotherapy He is started on chemotherapy with Velcade, Cytoxan and dexamethasone. Cytoxan was discontinued due to recurrent admission and recent clostridium Diff infection   03/09/2014 - 03/11/2014 Hospital Admission He was admitted to the hospital with fevers and chills and was diagnosed with pneumonia   03/22/2014 - 03/26/2014 Hospital Admission He is admitted to the hospital again for fever, chills and possible pneumonia   04/19/2014 - 04/22/2014 Hospital Admission He is admitted to the hospital for fever, chills and diarrhea and was subsequently found to have C. difficile infection   05/04/2014 - 05/05/2014 Hospital Admission The patient was admitted to the hospital again with fever  and chills. No source of infection was found and he was not given antibiotics.    INTERVAL HISTORY: Please see below for problem oriented charting. He is seen to review test results. The patient had a fall from a hospitalization recently for fevers and chills. His recent infection from C. difficile has resolved. His appetite is stable. Denies recent peripheral neuropathy.  REVIEW OF SYSTEMS:   Constitutional: Denies fevers, chills or abnormal weight loss Eyes: Denies blurriness of vision Ears, nose, mouth, throat, and face: Denies mucositis or sore throat Respiratory: Denies cough, dyspnea or wheezes Cardiovascular: Denies palpitation, chest discomfort or lower extremity swelling Gastrointestinal:  Denies nausea, heartburn or change in bowel habits Skin: Denies abnormal skin rashes Lymphatics: Denies new lymphadenopathy or easy bruising Neurological:Denies numbness, tingling or new weaknesses Behavioral/Psych: Mood is stable, no new changes  All other systems were reviewed with the patient and are negative.  I have reviewed the past medical history, past surgical history, social history and family history with the patient and they are unchanged from previous note.  ALLERGIES:  has No Known Allergies.  MEDICATIONS:  Current Outpatient Prescriptions  Medication Sig Dispense Refill  . acetaminophen (TYLENOL) 325 MG tablet Take 2 tablets (650 mg total) by mouth every 6 (six) hours as needed for mild pain (or Fever >/= 101). 30 tablet 0  . CALCIUM PO Take 1,000 mg by mouth daily.    . Cholecalciferol (VITAMIN D3) 2000 UNITS TABS Take 1 tablet by mouth daily.     .Marland Kitchendexamethasone (DECADRON) 4 MG tablet Take 10 pills every Monday with breakfast 90 tablet 1  . folic acid (FOLVITE) 1 MG tablet Take 1 tablet (1 mg total) by mouth daily. 30 tablet 2  . hydrocortisone (  ANUSOL-HC) 2.5 % rectal cream Place rectally 2 (two) times daily. 30 g 1  . insulin glargine (LANTUS) 100 UNIT/ML injection  Inject 5-6 Units into the skin at bedtime. Take 5 units at Bedtime Everyday Except on Mondays Take 6 units.    . Magnesium 400 MG TABS Take 400 mg by mouth daily.    . metFORMIN (GLUCOPHAGE) 500 MG tablet Take 500 mg by mouth 2 (two) times daily with a meal.    . metroNIDAZOLE (FLAGYL) 500 MG tablet Take 1 tablet (500 mg total) by mouth every 8 (eight) hours. 42 tablet 0  . Multiple Vitamin (MULTIVITAMIN) tablet Take 1 tablet by mouth daily.    . naproxen sodium (ANAPROX) 220 MG tablet Take 440 mg by mouth daily as needed (pain).     Marland Kitchen omeprazole (PRILOSEC) 20 MG capsule Take 20 mg by mouth daily.    Marland Kitchen oxyCODONE (OXY IR/ROXICODONE) 5 MG immediate release tablet Take 1 tablet (5 mg total) by mouth every 6 (six) hours as needed for severe pain. 30 tablet 0  . pravastatin (PRAVACHOL) 40 MG tablet Take 40 mg by mouth daily. Daily  1  . Pseudoephedrine-APAP-DM (DAYQUIL PO) Take 30 mLs by mouth daily as needed (cold symptoms).     . saccharomyces boulardii (FLORASTOR) 250 MG capsule Take 1 capsule (250 mg total) by mouth 2 (two) times daily. 60 capsule 2  . BAYER CONTOUR NEXT TEST test strip   5  . guaiFENesin (ROBITUSSIN) 100 MG/5ML SOLN Take 10 mLs (200 mg total) by mouth every 4 (four) hours as needed. (Patient not taking: Reported on 05/17/2014) 1200 mL 0  . lenalidomide (REVLIMID) 10 MG capsule Take 1 capsule (10 mg total) by mouth daily. 14 capsule 0  . prochlorperazine (COMPAZINE) 10 MG tablet Take 1 tablet (10 mg total) by mouth every 6 (six) hours as needed (Nausea or vomiting). (Patient not taking: Reported on 05/17/2014) 30 tablet 1   No current facility-administered medications for this visit.    PHYSICAL EXAMINATION: ECOG PERFORMANCE STATUS: 1 - Symptomatic but completely ambulatory  Filed Vitals:   05/17/14 1103  BP: 136/76  Pulse: 73  Temp: 96.7 F (35.9 C)  Resp: 18   Filed Weights   05/17/14 1103  Weight: 189 lb 11.2 oz (86.047 kg)    GENERAL:alert, no distress and  comfortable SKIN: skin color, texture, turgor are normal, no rashes or significant lesions EYES: normal, Conjunctiva are pink and non-injected, sclera clear OROPHARYNX:no exudate, no erythema and lips, buccal mucosa, and tongue normal  NECK: supple, thyroid normal size, non-tender, without nodularity LYMPH:  no palpable lymphadenopathy in the cervical, axillary or inguinal LUNGS: clear to auscultation and percussion with normal breathing effort HEART: regular rate & rhythm and no murmurs and no lower extremity edema ABDOMEN:abdomen soft, non-tender and normal bowel sounds Musculoskeletal:no cyanosis of digits and no clubbing  NEURO: alert & oriented x 3 with fluent speech, no focal motor/sensory deficits  LABORATORY DATA:  I have reviewed the data as listed    Component Value Date/Time   NA 138 05/17/2014 1047   NA 138 05/05/2014 0554   K 3.9 05/17/2014 1047   K 4.0 05/05/2014 0554   CL 105 05/05/2014 0554   CO2 27 05/17/2014 1047   CO2 27 05/05/2014 0554   GLUCOSE 83 05/17/2014 1047   GLUCOSE 94 05/05/2014 0554   BUN 15.4 05/17/2014 1047   BUN 14 05/05/2014 0554   CREATININE 1.1 05/17/2014 1047   CREATININE 1.03 05/05/2014 0554  CALCIUM 9.6 05/17/2014 1047   CALCIUM 8.3* 05/05/2014 0554   PROT 7.7 05/17/2014 1047   PROT 6.8 05/05/2014 0554   ALBUMIN 4.0 05/17/2014 1047   ALBUMIN 3.7 05/05/2014 0554   AST 30 05/17/2014 1047   AST 29 05/05/2014 0554   ALT 26 05/17/2014 1047   ALT 24 05/05/2014 0554   ALKPHOS 70 05/17/2014 1047   ALKPHOS 58 05/05/2014 0554   BILITOT 0.87 05/17/2014 1047   BILITOT 0.6 05/05/2014 0554   GFRNONAA 76* 05/05/2014 0554   GFRAA 89* 05/05/2014 0554    No results found for: SPEP, UPEP  Lab Results  Component Value Date   WBC 7.3 05/17/2014   NEUTROABS 3.6 05/17/2014   HGB 13.0 05/17/2014   HCT 38.7 05/17/2014   MCV 92.8 05/17/2014   PLT 188 05/17/2014      Chemistry      Component Value Date/Time   NA 138 05/17/2014 1047   NA 138  05/05/2014 0554   K 3.9 05/17/2014 1047   K 4.0 05/05/2014 0554   CL 105 05/05/2014 0554   CO2 27 05/17/2014 1047   CO2 27 05/05/2014 0554   BUN 15.4 05/17/2014 1047   BUN 14 05/05/2014 0554   CREATININE 1.1 05/17/2014 1047   CREATININE 1.03 05/05/2014 0554      Component Value Date/Time   CALCIUM 9.6 05/17/2014 1047   CALCIUM 8.3* 05/05/2014 0554   ALKPHOS 70 05/17/2014 1047   ALKPHOS 58 05/05/2014 0554   AST 30 05/17/2014 1047   AST 29 05/05/2014 0554   ALT 26 05/17/2014 1047   ALT 24 05/05/2014 0554   BILITOT 0.87 05/17/2014 1047   BILITOT 0.6 05/05/2014 0554     ASSESSMENT & PLAN:  Multiple myeloma I had a long discussion with the patient and his wife. We reviewed the most recent test result. The patient had achieved only partial response, complicated by for hospitalization related to infection. I recommend we switch treatment. I will discontinue Cytoxan but substituted with Revlimid. I plan to give him Velcade subcutaneous injection on days 1, 4, 7 and 11 of cycle every 21 days along with Revlimid 10 mg days 1-14, cycle every 21 days. He will take reduced dose dexamethasone 20 mg weekly. He will continue Zometa monthly. We discussed the role of chemotherapy. The intent is for palliative.  We discussed some of the risks, benefits, side-effects of Revlimid.   Some of the short term side-effects included, though not limited to, risk of fatigue, weight loss, pancytopenia, life-threatening infections, need for transfusions of blood products, nausea, vomiting, change in bowel habits, blood clots, admission to hospital for various reasons, and risks of death.   Long term side-effects are also discussed including risks of infertility, permanent damage to nerve function, chronic fatigue, and rare secondary malignancy including bone marrow disorders such as acute leukemia.   The patient is aware that the response rates discussed earlier is not guaranteed.    After a long  discussion, patient made an informed decision to proceed with the prescribed plan of care and went ahead to sign the consent form today.   Patient education material was dispensed I plan to get him started on treatment next week.    Sepsis The patient have recurrent episodes of sepsis related to his cancer. I recommend IVIG monthly to prevent risk of infection.   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 He had 4 recent hospitalization. His blood work showed  that the patient has significant hypogammaglobulinemia. I recommend a trial of IVIG, 1 g/kg monthly and he agreed to proceed.   Amyloidosis I told the patient they are available data of using combination of Revlimid, Velcade and dexamethasone to treat amyloidosis. His M spike protein is still greater than 1 g. The patient will continue treatment for now and once I can document good response to treatment, I will refer him to his transplant physician for bone marrow transplant in the near future    Orders Placed This Encounter  Procedures  . SPEP & IFE with QIG    Standing Status: Future     Number of Occurrences:      Standing Expiration Date: 06/21/2015  . Kappa/lambda light chains    Standing Status: Future     Number of Occurrences:      Standing Expiration Date: 06/21/2015  . Beta 2 microglobulin, serum    Standing Status: Future     Number of Occurrences:      Standing Expiration Date: 06/21/2015   All questions were answered. The patient knows to call the clinic with any problems, questions or concerns. No barriers to learning was detected. I spent 40 minutes counseling the patient face to face. The total time spent in the appointment was 55 minutes and more than 50% was on counseling and review of test results     [facility], [place], MD 05/17/2014 4:02 PM

## 2014-05-17 NOTE — Assessment & Plan Note (Signed)
He is severely immunocompromised from diagnosis of multiple myeloma, amyloidosis with background history of diabetes. He had 4 recent hospitalization. His blood work showed that the patient has significant hypogammaglobulinemia. I recommend a trial of IVIG, 1 g/kg monthly and he agreed to proceed.

## 2014-05-18 ENCOUNTER — Telehealth: Payer: Self-pay

## 2014-05-18 ENCOUNTER — Telehealth: Payer: Self-pay | Admitting: Hematology and Oncology

## 2014-05-18 ENCOUNTER — Ambulatory Visit: Payer: BC Managed Care – PPO

## 2014-05-18 ENCOUNTER — Other Ambulatory Visit: Payer: Self-pay | Admitting: Hematology and Oncology

## 2014-05-18 NOTE — Telephone Encounter (Signed)
Fax from [employer] requesting ICD 10 code and Rx insurance. This is for them to process revlimid Rx. Faxed demographic sheet with ICD 10 C90.00 hand written on it.

## 2014-05-18 NOTE — Telephone Encounter (Signed)
Left message to confirm all appointments for week of 02/29.

## 2014-05-21 ENCOUNTER — Telehealth: Payer: Self-pay | Admitting: *Deleted

## 2014-05-21 ENCOUNTER — Ambulatory Visit (HOSPITAL_BASED_OUTPATIENT_CLINIC_OR_DEPARTMENT_OTHER): Payer: BC Managed Care – PPO

## 2014-05-21 ENCOUNTER — Ambulatory Visit: Payer: BC Managed Care – PPO

## 2014-05-21 DIAGNOSIS — Z5112 Encounter for antineoplastic immunotherapy: Secondary | ICD-10-CM

## 2014-05-21 DIAGNOSIS — D801 Nonfamilial hypogammaglobulinemia: Secondary | ICD-10-CM

## 2014-05-21 DIAGNOSIS — C9000 Multiple myeloma not having achieved remission: Secondary | ICD-10-CM

## 2014-05-21 MED ORDER — ZOLEDRONIC ACID 4 MG/100ML IV SOLN
4.0000 mg | Freq: Once | INTRAVENOUS | Status: AC
  Administered 2014-05-21: 4 mg via INTRAVENOUS
  Filled 2014-05-21: qty 100

## 2014-05-21 MED ORDER — BORTEZOMIB CHEMO SQ INJECTION 3.5 MG (2.5MG/ML)
1.3000 mg/m2 | Freq: Once | INTRAMUSCULAR | Status: AC
  Administered 2014-05-21: 2.75 mg via SUBCUTANEOUS
  Filled 2014-05-21: qty 2.75

## 2014-05-21 MED ORDER — IMMUNE GLOBULIN (HUMAN) 10 GM/100ML IV SOLN
1.0000 g/kg | Freq: Once | INTRAVENOUS | Status: AC
  Administered 2014-05-21: 80 g via INTRAVENOUS
  Filled 2014-05-21: qty 800

## 2014-05-21 MED ORDER — DEXAMETHASONE 4 MG PO TABS
ORAL_TABLET | ORAL | Status: AC
  Filled 2014-05-21: qty 5

## 2014-05-21 MED ORDER — DEXAMETHASONE 4 MG PO TABS
20.0000 mg | ORAL_TABLET | Freq: Once | ORAL | Status: AC
  Administered 2014-05-21: 20 mg via ORAL

## 2014-05-21 MED ORDER — ONDANSETRON HCL 8 MG PO TABS
8.0000 mg | ORAL_TABLET | Freq: Once | ORAL | Status: AC
  Administered 2014-05-21: 8 mg via ORAL

## 2014-05-21 MED ORDER — ONDANSETRON HCL 8 MG PO TABS
ORAL_TABLET | ORAL | Status: AC
  Filled 2014-05-21: qty 1

## 2014-05-21 MED ORDER — SODIUM CHLORIDE 0.9 % IV SOLN
Freq: Once | INTRAVENOUS | Status: AC
  Administered 2014-05-21: 09:00:00 via INTRAVENOUS

## 2014-05-21 NOTE — Telephone Encounter (Signed)
Call received from [employer] that patient's Revlimid requires prior authorization.  [employer] will work on this and they need recent clinic notes faxed.  Jade with [employer] [phone] Fax: [phone].  This nurse faxed 05-17-2014 note as requested.

## 2014-05-22 ENCOUNTER — Ambulatory Visit: Payer: BC Managed Care – PPO

## 2014-05-22 ENCOUNTER — Telehealth: Payer: Self-pay | Admitting: *Deleted

## 2014-05-22 ENCOUNTER — Encounter: Payer: Self-pay | Admitting: Hematology and Oncology

## 2014-05-22 NOTE — Progress Notes (Signed)
Sent note to Dr. Darnell Level for peer- to peer for ivig. [facility] Panwar=medical director   [phone]. It was denied.  bcbs

## 2014-05-22 NOTE — Telephone Encounter (Signed)
S/w pt this morning.  Instructed him to take Aspirin 81 mg daily starting today per Dr. Alvy Bimler.  Pt's Revlimid was delivered to pt as we were on the phone.  Instructed pt to start Revlimid this morning. Pt reports he had five small diarrhea stools in a row this morning.  He denies any n/v or any fevers.  Instructed to avoid dairy products and to take imodium over the counter as needed for diarrhea.  Instructed to let us know if his diarrhea gets any worse.  He verbalized understanding.  Called pt back this afternoon to check on his diarrhea.  He has not had any more stools since earlier this morning. Repeated to pt and wife for pt to take imodium if needed and to let us know if it gets worse or any other concerns.  Pt verbalized understanding.  He says he did take his revlimid and aspiring this morning.

## 2014-05-23 ENCOUNTER — Ambulatory Visit: Payer: BC Managed Care – PPO

## 2014-05-24 ENCOUNTER — Ambulatory Visit (HOSPITAL_BASED_OUTPATIENT_CLINIC_OR_DEPARTMENT_OTHER): Payer: BC Managed Care – PPO

## 2014-05-24 DIAGNOSIS — C9000 Multiple myeloma not having achieved remission: Secondary | ICD-10-CM

## 2014-05-24 DIAGNOSIS — Z5112 Encounter for antineoplastic immunotherapy: Secondary | ICD-10-CM

## 2014-05-24 MED ORDER — BORTEZOMIB CHEMO SQ INJECTION 3.5 MG (2.5MG/ML)
1.3000 mg/m2 | Freq: Once | INTRAMUSCULAR | Status: AC
  Administered 2014-05-24: 2.75 mg via SUBCUTANEOUS
  Filled 2014-05-24: qty 2.75

## 2014-05-24 MED ORDER — ONDANSETRON HCL 8 MG PO TABS
ORAL_TABLET | ORAL | Status: AC
  Filled 2014-05-24: qty 1

## 2014-05-24 MED ORDER — ONDANSETRON HCL 8 MG PO TABS
8.0000 mg | ORAL_TABLET | Freq: Once | ORAL | Status: AC
  Administered 2014-05-24: 8 mg via ORAL

## 2014-05-24 NOTE — Patient Instructions (Signed)
[facility] Discharge Instructions for Patients Receiving Chemotherapy  Today you received the following chemotherapy agents Velcade.  To help prevent nausea and vomiting after your treatment, we encourage you to take your nausea medication as directed.    If you develop nausea and vomiting that is not controlled by your nausea medication, call the clinic.   BELOW ARE SYMPTOMS THAT SHOULD BE REPORTED IMMEDIATELY:  *FEVER GREATER THAN 100.5 F  *CHILLS WITH OR WITHOUT FEVER  NAUSEA AND VOMITING THAT IS NOT CONTROLLED WITH YOUR NAUSEA MEDICATION  *UNUSUAL SHORTNESS OF BREATH  *UNUSUAL BRUISING OR BLEEDING  TENDERNESS IN MOUTH AND THROAT WITH OR WITHOUT PRESENCE OF ULCERS  *URINARY PROBLEMS  *BOWEL PROBLEMS  UNUSUAL RASH Items with * indicate a potential emergency and should be followed up as soon as possible.  Feel free to call the clinic you have any questions or concerns. The clinic phone number is [phone].    

## 2014-05-25 ENCOUNTER — Telehealth: Payer: Self-pay | Admitting: *Deleted

## 2014-05-25 ENCOUNTER — Telehealth: Payer: Self-pay | Admitting: Hematology and Oncology

## 2014-05-25 ENCOUNTER — Other Ambulatory Visit: Payer: Self-pay | Admitting: Hematology and Oncology

## 2014-05-25 ENCOUNTER — Ambulatory Visit (HOSPITAL_BASED_OUTPATIENT_CLINIC_OR_DEPARTMENT_OTHER): Payer: BC Managed Care – PPO

## 2014-05-25 ENCOUNTER — Ambulatory Visit (HOSPITAL_COMMUNITY)
Admission: RE | Admit: 2014-05-25 | Discharge: 2014-05-25 | Disposition: A | Discharge status: Home / Self Care | Payer: BC Managed Care – PPO | Source: Ambulatory Visit | Attending: Hematology and Oncology | Admitting: Hematology and Oncology

## 2014-05-25 ENCOUNTER — Encounter: Payer: Self-pay | Admitting: Hematology and Oncology

## 2014-05-25 ENCOUNTER — Encounter (HOSPITAL_COMMUNITY): Payer: Self-pay | Admitting: *Deleted

## 2014-05-25 ENCOUNTER — Other Ambulatory Visit: Payer: Self-pay | Admitting: Nurse Practitioner

## 2014-05-25 ENCOUNTER — Ambulatory Visit (HOSPITAL_BASED_OUTPATIENT_CLINIC_OR_DEPARTMENT_OTHER): Payer: BC Managed Care – PPO | Admitting: Hematology and Oncology

## 2014-05-25 ENCOUNTER — Inpatient Hospital Stay (HOSPITAL_COMMUNITY)
Admission: AD | Admit: 2014-05-25 | Discharge: 2014-05-28 | DRG: 194 | Disposition: A | Discharge status: Home / Self Care | Payer: BC Managed Care – PPO | Source: Ambulatory Visit | Attending: Hematology and Oncology | Admitting: Hematology and Oncology

## 2014-05-25 ENCOUNTER — Other Ambulatory Visit (HOSPITAL_BASED_OUTPATIENT_CLINIC_OR_DEPARTMENT_OTHER): Payer: BC Managed Care – PPO

## 2014-05-25 ENCOUNTER — Ambulatory Visit: Payer: BC Managed Care – PPO

## 2014-05-25 VITALS — BP 106/51 | HR 93 | Temp 102.5°F | Resp 24

## 2014-05-25 VITALS — BP 128/62 | HR 89 | Temp 102.7°F | Resp 22 | Ht 72.0 in | Wt 197.2 lb

## 2014-05-25 DIAGNOSIS — J189 Pneumonia, unspecified organism: Secondary | ICD-10-CM | POA: Diagnosis present

## 2014-05-25 DIAGNOSIS — R531 Weakness: Secondary | ICD-10-CM

## 2014-05-25 DIAGNOSIS — R509 Fever, unspecified: Secondary | ICD-10-CM

## 2014-05-25 DIAGNOSIS — D72819 Decreased white blood cell count, unspecified: Secondary | ICD-10-CM | POA: Diagnosis present

## 2014-05-25 DIAGNOSIS — J9811 Atelectasis: Secondary | ICD-10-CM | POA: Insufficient documentation

## 2014-05-25 DIAGNOSIS — C9000 Multiple myeloma not having achieved remission: Secondary | ICD-10-CM | POA: Diagnosis present

## 2014-05-25 DIAGNOSIS — Z79891 Long term (current) use of opiate analgesic: Secondary | ICD-10-CM | POA: Diagnosis not present

## 2014-05-25 DIAGNOSIS — Z8547 Personal history of malignant neoplasm of testis: Secondary | ICD-10-CM

## 2014-05-25 DIAGNOSIS — D630 Anemia in neoplastic disease: Secondary | ICD-10-CM | POA: Diagnosis present

## 2014-05-25 DIAGNOSIS — Z794 Long term (current) use of insulin: Secondary | ICD-10-CM

## 2014-05-25 DIAGNOSIS — K219 Gastro-esophageal reflux disease without esophagitis: Secondary | ICD-10-CM | POA: Diagnosis present

## 2014-05-25 DIAGNOSIS — R938 Abnormal findings on diagnostic imaging of other specified body structures: Secondary | ICD-10-CM

## 2014-05-25 DIAGNOSIS — C9001 Multiple myeloma in remission: Secondary | ICD-10-CM | POA: Diagnosis present

## 2014-05-25 DIAGNOSIS — A047 Enterocolitis due to Clostridium difficile: Secondary | ICD-10-CM | POA: Diagnosis present

## 2014-05-25 DIAGNOSIS — N451 Epididymitis: Secondary | ICD-10-CM | POA: Diagnosis present

## 2014-05-25 DIAGNOSIS — E119 Type 2 diabetes mellitus without complications: Secondary | ICD-10-CM | POA: Diagnosis present

## 2014-05-25 DIAGNOSIS — R4182 Altered mental status, unspecified: Secondary | ICD-10-CM

## 2014-05-25 DIAGNOSIS — I959 Hypotension, unspecified: Secondary | ICD-10-CM

## 2014-05-25 DIAGNOSIS — D638 Anemia in other chronic diseases classified elsewhere: Secondary | ICD-10-CM | POA: Insufficient documentation

## 2014-05-25 LAB — GLUCOSE, CAPILLARY
GLUCOSE-CAPILLARY: 153 mg/dL — AB (ref 70–99)
GLUCOSE-CAPILLARY: 105 mg/dL — AB (ref 70–99)
Glucose-Capillary: 67 mg/dL — ABNORMAL LOW (ref 70–99)

## 2014-05-25 LAB — CBC WITH DIFFERENTIAL/PLATELET
BASO%: 0.2 % (ref 0.0–2.0)
Basophils Absolute: 0 10*3/uL (ref 0.0–0.1)
EOS%: 2.7 % (ref 0.0–7.0)
Eosinophils Absolute: 0.2 10*3/uL (ref 0.0–0.5)
HCT: 32.6 % — ABNORMAL LOW (ref 38.4–49.9)
HGB: 10.7 g/dL — ABNORMAL LOW (ref 13.0–17.1)
LYMPH%: 11 % — AB (ref 14.0–49.0)
MCH: 30.1 pg (ref 27.2–33.4)
MCHC: 32.8 g/dL (ref 32.0–36.0)
MCV: 91.8 fL (ref 79.3–98.0)
MONO#: 0.8 10*3/uL (ref 0.1–0.9)
MONO%: 13 % (ref 0.0–14.0)
NEUT%: 73.1 % (ref 39.0–75.0)
NEUTROS ABS: 4.6 10*3/uL (ref 1.5–6.5)
Platelets: 173 10*3/uL (ref 140–400)
RBC: 3.55 10*6/uL — AB (ref 4.20–5.82)
RDW: 15.3 % — AB (ref 11.0–14.6)
WBC: 6.3 10*3/uL (ref 4.0–10.3)
lymph#: 0.7 10*3/uL — ABNORMAL LOW (ref 0.9–3.3)

## 2014-05-25 LAB — COMPREHENSIVE METABOLIC PANEL (CC13)
ALT: 31 U/L (ref 0–55)
AST: 32 U/L (ref 5–34)
Albumin: 3.5 g/dL (ref 3.5–5.0)
Alkaline Phosphatase: 60 U/L (ref 40–150)
Anion Gap: 8 mEq/L (ref 3–11)
BILIRUBIN TOTAL: 0.89 mg/dL (ref 0.20–1.20)
BUN: 15.7 mg/dL (ref 7.0–26.0)
CO2: 26 mEq/L (ref 22–29)
CREATININE: 1.2 mg/dL (ref 0.7–1.3)
Calcium: 8.8 mg/dL (ref 8.4–10.4)
Chloride: 99 mEq/L (ref 98–109)
EGFR: 67 mL/min/{1.73_m2} — ABNORMAL LOW (ref >90)
Glucose: 80 mg/dl (ref 70–140)
Potassium: 3.5 mEq/L (ref 3.5–5.1)
Sodium: 133 mEq/L — ABNORMAL LOW (ref 136–145)
Total Protein: 7.5 g/dL (ref 6.4–8.3)

## 2014-05-25 LAB — URINALYSIS, MICROSCOPIC - CHCC
Bilirubin (Urine): NEGATIVE
Blood: NEGATIVE
Glucose: NEGATIVE mg/dL
Ketones: NEGATIVE mg/dL
Leukocyte Esterase: NEGATIVE
Nitrite: NEGATIVE
Protein: NEGATIVE mg/dL
Specific Gravity, Urine: 1.005 (ref 1.003–1.035)
Urobilinogen, UR: 0.2 mg/dL (ref 0.2–1)
WBC, UA: NEGATIVE (ref 0–2)
pH: 6 (ref 4.6–8.0)

## 2014-05-25 LAB — TECHNOLOGIST REVIEW

## 2014-05-25 LAB — LACTIC ACID, PLASMA: Lactic Acid, Venous: 1.6 mmol/L (ref 0.5–2.0)

## 2014-05-25 MED ORDER — SENNOSIDES-DOCUSATE SODIUM 8.6-50 MG PO TABS: 1.0000 | ORAL_TABLET | Freq: Two times a day (BID) | ORAL | Status: DC | PRN

## 2014-05-25 MED ORDER — CALCIUM 600 MG PO TABS: 1000.0000 mg | ORAL_TABLET | Freq: Every day | ORAL | Status: DC

## 2014-05-25 MED ORDER — CHLORHEXIDINE GLUCONATE 0.12 % MT SOLN
15.0000 mL | Freq: Two times a day (BID) | OROMUCOSAL | Status: DC
  Administered 2014-05-26 – 2014-05-28 (×5): 15 mL via OROMUCOSAL
  Filled 2014-05-25 (×8): qty 15

## 2014-05-25 MED ORDER — CETYLPYRIDINIUM CHLORIDE 0.05 % MT LIQD
7.0000 mL | Freq: Two times a day (BID) | OROMUCOSAL | Status: DC
  Administered 2014-05-27: 7 mL via OROMUCOSAL

## 2014-05-25 MED ORDER — ONDANSETRON 8 MG/NS 50 ML IVPB
8.0000 mg | Freq: Three times a day (TID) | INTRAVENOUS | Status: DC | PRN
  Filled 2014-05-25: qty 8

## 2014-05-25 MED ORDER — ENOXAPARIN SODIUM 40 MG/0.4ML ~~LOC~~ SOLN
40.0000 mg | SUBCUTANEOUS | Status: DC
  Administered 2014-05-25 – 2014-05-27 (×3): 40 mg via SUBCUTANEOUS
  Filled 2014-05-25 (×4): qty 0.4

## 2014-05-25 MED ORDER — ACETAMINOPHEN 325 MG PO TABS: 650.0000 mg | ORAL_TABLET | ORAL | Status: DC | PRN

## 2014-05-25 MED ORDER — PRAVASTATIN SODIUM 40 MG PO TABS
40.0000 mg | ORAL_TABLET | Freq: Every day | ORAL | Status: DC
  Administered 2014-05-26 – 2014-05-28 (×3): 40 mg via ORAL
  Filled 2014-05-25 (×3): qty 1

## 2014-05-25 MED ORDER — ONDANSETRON HCL 8 MG PO TABS: 8.0000 mg | ORAL_TABLET | Freq: Three times a day (TID) | ORAL | Status: DC | PRN

## 2014-05-25 MED ORDER — INSULIN ASPART 100 UNIT/ML ~~LOC~~ SOLN: 0.0000 [IU] | Freq: Three times a day (TID) | SUBCUTANEOUS | Status: DC

## 2014-05-25 MED ORDER — ONDANSETRON 4 MG PO TBDP
8.0000 mg | ORAL_TABLET | Freq: Three times a day (TID) | ORAL | Status: DC | PRN
  Administered 2014-05-25: 8 mg via ORAL
  Filled 2014-05-25: qty 2

## 2014-05-25 MED ORDER — ADULT MULTIVITAMIN W/MINERALS CH
1.0000 | ORAL_TABLET | Freq: Every day | ORAL | Status: DC
  Administered 2014-05-26 – 2014-05-28 (×3): 1 via ORAL
  Filled 2014-05-25 (×3): qty 1

## 2014-05-25 MED ORDER — CEFTRIAXONE SODIUM 2 G IJ SOLR
2.0000 g | INTRAMUSCULAR | Status: DC
  Administered 2014-05-25 – 2014-05-26 (×2): 2 g via INTRAVENOUS
  Filled 2014-05-25 (×2): qty 2

## 2014-05-25 MED ORDER — PANTOPRAZOLE SODIUM 40 MG PO TBEC
40.0000 mg | DELAYED_RELEASE_TABLET | Freq: Every day | ORAL | Status: DC
  Administered 2014-05-26 – 2014-05-28 (×3): 40 mg via ORAL
  Filled 2014-05-25 (×3): qty 1

## 2014-05-25 MED ORDER — NAPROXEN SODIUM 275 MG PO TABS: 440.0000 mg | ORAL_TABLET | Freq: Every day | ORAL | Status: DC | PRN

## 2014-05-25 MED ORDER — INSULIN GLARGINE 100 UNIT/ML ~~LOC~~ SOLN
5.0000 [IU] | Freq: Every day | SUBCUTANEOUS | Status: DC
  Administered 2014-05-25 – 2014-05-27 (×3): 5 [IU] via SUBCUTANEOUS
  Filled 2014-05-25 (×3): qty 0.05

## 2014-05-25 MED ORDER — VITAMIN D 1000 UNITS PO TABS
2000.0000 [IU] | ORAL_TABLET | Freq: Every day | ORAL | Status: DC
  Administered 2014-05-26 – 2014-05-28 (×3): 2000 [IU] via ORAL
  Filled 2014-05-25 (×4): qty 2

## 2014-05-25 MED ORDER — GUAIFENESIN 100 MG/5ML PO SOLN: 200.0000 mg | ORAL | Status: DC | PRN

## 2014-05-25 MED ORDER — PROCHLORPERAZINE MALEATE 10 MG PO TABS
10.0000 mg | ORAL_TABLET | Freq: Four times a day (QID) | ORAL | Status: DC | PRN
Start: 2014-05-25 — End: 2014-05-28

## 2014-05-25 MED ORDER — OXYCODONE HCL 5 MG PO TABS: 5.0000 mg | ORAL_TABLET | Freq: Four times a day (QID) | ORAL | Status: DC | PRN

## 2014-05-25 MED ORDER — FOLIC ACID 1 MG PO TABS
1.0000 mg | ORAL_TABLET | Freq: Every day | ORAL | Status: DC
  Administered 2014-05-26 – 2014-05-28 (×3): 1 mg via ORAL
  Filled 2014-05-25 (×3): qty 1

## 2014-05-25 MED ORDER — SODIUM CHLORIDE 0.9 % IV SOLN
INTRAVENOUS | Status: DC
  Administered 2014-05-25: 18:00:00 via INTRAVENOUS
  Administered 2014-05-26: 1000 mL via INTRAVENOUS
  Administered 2014-05-27 (×2): via INTRAVENOUS

## 2014-05-25 MED ORDER — MAGNESIUM 200 MG PO TABS
400.0000 mg | ORAL_TABLET | Freq: Every day | ORAL | Status: DC
  Filled 2014-05-25: qty 2

## 2014-05-25 MED ORDER — METFORMIN HCL 500 MG PO TABS
500.0000 mg | ORAL_TABLET | Freq: Two times a day (BID) | ORAL | Status: DC
  Administered 2014-05-25 – 2014-05-28 (×6): 500 mg via ORAL
  Filled 2014-05-25 (×9): qty 1

## 2014-05-25 MED ORDER — CALCIUM CARBONATE 1250 (500 CA) MG PO TABS
1.0000 | ORAL_TABLET | Freq: Every day | ORAL | Status: DC
  Administered 2014-05-26 – 2014-05-28 (×3): 500 mg via ORAL
  Filled 2014-05-25 (×3): qty 1

## 2014-05-25 MED ORDER — SODIUM CHLORIDE 0.9 % IV SOLN
1000.0000 mL | INTRAVENOUS | Status: AC
  Administered 2014-05-25: 1000 mL via INTRAVENOUS

## 2014-05-25 MED ORDER — ACETAMINOPHEN 325 MG PO TABS
650.0000 mg | ORAL_TABLET | Freq: Four times a day (QID) | ORAL | Status: DC | PRN
  Administered 2014-05-25: 650 mg via ORAL
  Filled 2014-05-25: qty 2

## 2014-05-25 MED ORDER — ALUM & MAG HYDROXIDE-SIMETH 200-200-20 MG/5ML PO SUSP: 60.0000 mL | ORAL | Status: DC | PRN

## 2014-05-25 NOTE — Progress Notes (Signed)
[facility] OFFICE PROGRESS NOTE  Patient Care Team: Hulan Fess, MD as PCP - General (Family Medicine) Heath Lark, MD as Consulting Physician (Hematology and Oncology)  SUMMARY OF ONCOLOGIC HISTORY: Oncology History   ISS Staging II, M-spike 1.7, IgG 2420, kappa 30.1, beta47mcroglobulin 4.48, Creatinine 1.2, calcium 9.8, hemoglobin 13.2, albumin 4.2     Multiple myeloma   01/10/2003 Pathology Results [id number] pathology showed pure seminoma with lymphovascular invasion, pT2 NX MX   01/10/2003 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 congo red staing reveals amyloid deposition within some of the amorphous material and within blood vessels. Normal cytogenetics.   02/14/2014 Imaging PET CT scan showed diffuse skeletal involvement   02/26/2014 - 05/10/2014 Chemotherapy He is started on chemotherapy with Velcade, Cytoxan and dexamethasone. Cytoxan was discontinued due to recurrent admission and recent clostridium Diff infection   03/09/2014 - 03/11/2014 Hospital Admission He was admitted to the hospital with fevers and chills and was diagnosed with pneumonia   03/22/2014 - 03/26/2014 Hospital Admission He is admitted to the hospital again for fever, chills and possible pneumonia   04/19/2014 - 04/22/2014 Hospital Admission He is admitted to the hospital for fever, chills and diarrhea and was subsequently found to have C. difficile infection   05/04/2014 - 05/05/2014 Hospital Admission The patient was admitted to the hospital again with fever  and chills. No source of infection was found and he was not given antibiotics.    INTERVAL HISTORY: Please see below for problem oriented charting. He is seen urgently today because of fever. He started to have fever and chills last night with temperature as high as 103. He has another fever today, resolved with Tylenol. He denies sore throat, nasal congestion or cough. Denies any dysuria, frequency, urgency or diarrhea. He complained of generalized weakness. REVIEW OF SYSTEMS:   Eyes: Denies blurriness of vision Ears, nose, mouth, throat, and face: Denies mucositis or sore throat Respiratory: Denies cough, dyspnea or wheezes Cardiovascular: Denies palpitation, chest discomfort or lower extremity swelling Gastrointestinal:  Denies nausea, heartburn or change in bowel habits Skin: Denies abnormal skin rashes Lymphatics: Denies new lymphadenopathy or easy bruising Neurological:Denies numbness, tingling or new weaknesses Behavioral/Psych: Mood is stable, no new changes  All other systems were reviewed with the patient and are negative.  I have reviewed the past medical history, past surgical history, social history and family history with the patient and they are unchanged from previous note.  ALLERGIES:  has No Known Allergies.  MEDICATIONS:  Current Outpatient Prescriptions  Medication Sig Dispense Refill  . acetaminophen (TYLENOL) 325 MG tablet Take 2 tablets (650 mg total) by mouth every 6 (six) hours as needed for mild pain (or Fever >/= 101). 30 tablet 0  . BAYER CONTOUR NEXT TEST test strip   5  . CALCIUM PO Take 1,000 mg by mouth daily.    . Cholecalciferol (VITAMIN D3) 2000 UNITS TABS Take 1 tablet by mouth daily.     .Marland Kitchendexamethasone (DECADRON) 4 MG tablet Take 10 pills every Monday with breakfast 90 tablet 1  . folic acid (FOLVITE) 1  MG tablet Take 1 tablet (1 mg total) by mouth daily. 30 tablet 2  . guaiFENesin (ROBITUSSIN) 100 MG/5ML SOLN Take 10 mLs (200 mg total) by  mouth every 4 (four) hours as needed. 1200 mL 0  . hydrocortisone (ANUSOL-HC) 2.5 % rectal cream Place rectally 2 (two) times daily. 30 g 1  . insulin glargine (LANTUS) 100 UNIT/ML injection Inject 5-6 Units into the skin at bedtime. Take 5 units at Bedtime Everyday Except on Mondays Take 6 units.    Marland Kitchen lenalidomide (REVLIMID) 10 MG capsule Take 1 capsule (10 mg total) by mouth daily. 14 capsule 0  . Magnesium 400 MG TABS Take 400 mg by mouth daily.    . metFORMIN (GLUCOPHAGE) 500 MG tablet Take 500 mg by mouth 2 (two) times daily with a meal.    . metroNIDAZOLE (FLAGYL) 500 MG tablet Take 1 tablet (500 mg total) by mouth every 8 (eight) hours. 42 tablet 0  . Multiple Vitamin (MULTIVITAMIN) tablet Take 1 tablet by mouth daily.    . naproxen sodium (ANAPROX) 220 MG tablet Take 440 mg by mouth daily as needed (pain).     Marland Kitchen omeprazole (PRILOSEC) 20 MG capsule Take 20 mg by mouth daily.    Marland Kitchen oxyCODONE (OXY IR/ROXICODONE) 5 MG immediate release tablet Take 1 tablet (5 mg total) by mouth every 6 (six) hours as needed for severe pain. 30 tablet 0  . pravastatin (PRAVACHOL) 40 MG tablet Take 40 mg by mouth daily. Daily  1  . prochlorperazine (COMPAZINE) 10 MG tablet Take 1 tablet (10 mg total) by mouth every 6 (six) hours as needed (Nausea or vomiting). 30 tablet 1  . Pseudoephedrine-APAP-DM (DAYQUIL PO) Take 30 mLs by mouth daily as needed (cold symptoms).     . saccharomyces boulardii (FLORASTOR) 250 MG capsule Take 1 capsule (250 mg total) by mouth 2 (two) times daily. 60 capsule 2   No current facility-administered medications for this visit.    PHYSICAL EXAMINATION: ECOG PERFORMANCE STATUS: 1 - Symptomatic but completely ambulatory  Filed Vitals:   05/25/14 1236  BP: 128/62  Pulse: 89  Temp: 102.7 F (39.3 C)  Resp: 22   Filed Weights   05/25/14 1236  Weight: 197 lb 3.2 oz (89.449 kg)    GENERAL:alert, no distress and comfortable SKIN: skin color, texture, turgor are normal, no rashes  or significant lesions EYES: normal, Conjunctiva are pink and non-injected, sclera clear OROPHARYNX:no exudate, no erythema and lips, buccal mucosa, and tongue normal  NECK: supple, thyroid normal size, non-tender, without nodularity LYMPH:  no palpable lymphadenopathy in the cervical, axillary or inguinal LUNGS: clear to auscultation and percussion with normal breathing effort HEART: regular rate & rhythm and no murmurs and no lower extremity edema ABDOMEN:abdomen soft, non-tender and normal bowel sounds Musculoskeletal:no cyanosis of digits and no clubbing  NEURO: alert & oriented x 3 with fluent speech, no focal motor/sensory deficits  LABORATORY DATA:  I have reviewed the data as listed    Component Value Date/Time   NA 133* 05/25/2014 1212   NA 138 05/05/2014 0554   K 3.5 05/25/2014 1212   K 4.0 05/05/2014 0554   CL 105 05/05/2014 0554   CO2 26 05/25/2014 1212   CO2 27 05/05/2014 0554   GLUCOSE 80 05/25/2014 1212   GLUCOSE 94 05/05/2014 0554   BUN 15.7 05/25/2014 1212   BUN 14 05/05/2014 0554   CREATININE 1.2 05/25/2014 1212   CREATININE 1.03 05/05/2014 0554   CALCIUM 8.8 05/25/2014 1212  CALCIUM 8.3* 05/05/2014 0554   PROT 7.5 05/25/2014 1212   PROT 6.8 05/05/2014 0554   ALBUMIN 3.5 05/25/2014 1212   ALBUMIN 3.7 05/05/2014 0554   AST 32 05/25/2014 1212   AST 29 05/05/2014 0554   ALT 31 05/25/2014 1212   ALT 24 05/05/2014 0554   ALKPHOS 60 05/25/2014 1212   ALKPHOS 58 05/05/2014 0554   BILITOT 0.89 05/25/2014 1212   BILITOT 0.6 05/05/2014 0554   GFRNONAA 76* 05/05/2014 0554   GFRAA 89* 05/05/2014 0554    No results found for: SPEP, UPEP  Lab Results  Component Value Date   WBC 6.3 05/25/2014   NEUTROABS 4.6 05/25/2014   HGB 10.7* 05/25/2014   HCT 32.6* 05/25/2014   MCV 91.8 05/25/2014   PLT 173 05/25/2014      Chemistry      Component Value Date/Time   NA 133* 05/25/2014 1212   NA 138 05/05/2014 0554   K 3.5 05/25/2014 1212   K 4.0 05/05/2014 0554    CL 105 05/05/2014 0554   CO2 26 05/25/2014 1212   CO2 27 05/05/2014 0554   BUN 15.7 05/25/2014 1212   BUN 14 05/05/2014 0554   CREATININE 1.2 05/25/2014 1212   CREATININE 1.03 05/05/2014 0554      Component Value Date/Time   CALCIUM 8.8 05/25/2014 1212   CALCIUM 8.3* 05/05/2014 0554   ALKPHOS 60 05/25/2014 1212   ALKPHOS 58 05/05/2014 0554   AST 32 05/25/2014 1212   AST 29 05/05/2014 0554   ALT 31 05/25/2014 1212   ALT 24 05/05/2014 0554   BILITOT 0.89 05/25/2014 1212   BILITOT 0.6 05/05/2014 0554     ASSESSMENT & PLAN:  Multiple myeloma I have recently presented his case at the hematology tumor before. He is Cytoxan was discontinued but yet the patient still spiked fever. He received 1 dose of IVIG with the hope of preventing infection on 05/21/2014 and yet he spiked another fever. By the process of elimination, another possibility would be whether he has reaction towards Velcade. I will continue supportive care. In the meantime, I told him to hold off taking Revlimid and prednisone. I plan to reassess him again later today and next week.   Fever The patient has recurrent fever, high-grade at 102.7. He also had chills. There are no localizing signs or symptoms such as sore throat, cough or diarrhea. Urinalysis show nothing suspicious for urinary tract infection. Blood culture and urine culture are pending. I will get him to get Chex x-ray to exclude pneumonia. I will see him back later today to review test results. In the meantime, I recommend Tylenol or naproxen for fever. Unless I have clinical proof of infection, I want to avoid giving him antibiotic treatment as they have caused recurrent infection with clostridium deficit recently.   Anemia in neoplastic disease This is likely due to recent treatment. The patient denies recent history of bleeding such as epistaxis, hematuria or hematochezia. He is asymptomatic from the anemia. I will observe for now.  He does not  require transfusion now.    Weakness generalized The cause is unknown. I recommend a liter of IV fluids. He agreed to proceed    No orders of the defined types were placed in this encounter.   All questions were answered. The patient knows to call the clinic with any problems, questions or concerns. No barriers to learning was detected. I spent 30 minutes counseling the patient face to face. The total time spent in  the appointment was 40 minutes and more than 50% was on counseling and review of test results     [facility], [place], MD 05/25/2014 1:30 PM

## 2014-05-25 NOTE — Telephone Encounter (Signed)
Pt left VM asking if he should hold Revlimid this morning?  Informed pt to hold Revlimid this morning until he sees Dr. Alvy Bimler later today.  He verbalized understanding.

## 2014-05-25 NOTE — H&P (Signed)
Please see the dictated H&P note from the office today.

## 2014-05-25 NOTE — Telephone Encounter (Signed)
Patient called in to report a fever and chills during the night.  The maximum temperature during the night was 102 degrees.  He took Tylenol and his temperature is currently 99.6, still having some chills but not as bad as during the night.  He denies any respiratory, gastrointestinal, urinary symptoms whatsoever. He received Velcade yesterday and is also taking Revlimid Discussed with Selena Lesser, NP.  Dr. Alvy Bimler would like for the patient to come in for labs at 1:00 pm and to see her at 1:30 pm.  Ms. Berniece Salines will order labs and enter create electronic POF. Called patient to inform him of the time to come into the [facility] for those appointments. He agreed with the plan.

## 2014-05-25 NOTE — Assessment & Plan Note (Signed)
The cause is unknown. I recommend a liter of IV fluids. He agreed to proceed

## 2014-05-25 NOTE — Addendum Note (Signed)
Addended byAlvy Bimler, Addysyn Fern on: 05/25/2014 04:22 PM   Modules accepted: Orders, Medications, Level of Service

## 2014-05-25 NOTE — Patient Instructions (Signed)
Dehydration, Adult Dehydration is when you lose more fluids from the body than you take in. Vital organs like the kidneys, brain, and heart cannot function without a proper amount of fluids and salt. Any loss of fluids from the body can cause dehydration.  CAUSES   Vomiting.  Diarrhea.  Excessive sweating.  Excessive urine output.  Fever. SYMPTOMS  Mild dehydration  Thirst.  Dry lips.  Slightly dry mouth. Moderate dehydration  Very dry mouth.  Sunken eyes.  Skin does not bounce back quickly when lightly pinched and released.  Dark urine and decreased urine production.  Decreased tear production.  Headache. Severe dehydration  Very dry mouth.  Extreme thirst.  Rapid, weak pulse (more than 100 beats per minute at rest).  Cold hands and feet.  Not able to sweat in spite of heat and temperature.  Rapid breathing.  Blue lips.  Confusion and lethargy.  Difficulty being awakened.  Minimal urine production.  No tears. DIAGNOSIS  Your caregiver will diagnose dehydration based on your symptoms and your exam. Blood and urine tests will help confirm the diagnosis. The diagnostic evaluation should also identify the cause of dehydration. TREATMENT  Treatment of mild or moderate dehydration can often be done at home by increasing the amount of fluids that you drink. It is best to drink small amounts of fluid more often. Drinking too much at one time can make vomiting worse. Refer to the home care instructions below. Severe dehydration needs to be treated at the hospital where you will probably be given intravenous (IV) fluids that contain water and electrolytes. HOME CARE INSTRUCTIONS   Ask your caregiver about specific rehydration instructions.  Drink enough fluids to keep your urine clear or pale yellow.  Drink small amounts frequently if you have nausea and vomiting.  Eat as you normally do.  Avoid:  Foods or drinks high in sugar.  Carbonated  drinks.  Juice.  Extremely hot or cold fluids.  Drinks with caffeine.  Fatty, greasy foods.  Alcohol.  Tobacco.  Overeating.  Gelatin desserts.  Wash your hands well to avoid spreading bacteria and viruses.  Only take over-the-counter or prescription medicines for pain, discomfort, or fever as directed by your caregiver.  Ask your caregiver if you should continue all prescribed and over-the-counter medicines.  Keep all follow-up appointments with your caregiver. SEEK MEDICAL CARE IF:  You have abdominal pain and it increases or stays in one area (localizes).  You have a rash, stiff neck, or severe headache.  You are irritable, sleepy, or difficult to awaken.  You are weak, dizzy, or extremely thirsty. SEEK IMMEDIATE MEDICAL CARE IF:   You are unable to keep fluids down or you get worse despite treatment.  You have frequent episodes of vomiting or diarrhea.  You have blood or green matter (bile) in your vomit.  You have blood in your stool or your stool looks black and tarry.  You have not urinated in 6 to 8 hours, or you have only urinated a small amount of very dark urine.  You have a fever.  You faint. MAKE SURE YOU:   Understand these instructions.  Will watch your condition.  Will get help right away if you are not doing well or get worse. Document Released: 03/09/2005 Document Revised: 06/01/2011 Document Reviewed: 10/27/2010 Exit[employer] Patient Information 2015 [employer]. This information is not intended to replace advice given to you by your health care provider. Make sure you discuss any questions you have with your health care   provider.  

## 2014-05-25 NOTE — Progress Notes (Signed)
Dr Alvy Bimler here to see patient at 1600, temp remains up at 102.5. She spoke with wife and patient to be admitted.  [id number] Patient transported to room 1327 via w/c, report given to RN.

## 2014-05-25 NOTE — Telephone Encounter (Signed)
s.w pt and advised on on todays appt...pt ok and aware

## 2014-05-25 NOTE — Assessment & Plan Note (Signed)
I have recently presented his case at the hematology tumor before. He is Cytoxan was discontinued but yet the patient still spiked fever. He received 1 dose of IVIG with the hope of preventing infection on 05/21/2014 and yet he spiked another fever. By the process of elimination, another possibility would be whether he has reaction towards Velcade. I will continue supportive care. In the meantime, I told him to hold off taking Revlimid and prednisone. I plan to reassess him again later today and next week.

## 2014-05-25 NOTE — Assessment & Plan Note (Signed)
The patient has recurrent fever, high-grade at 102.7. He also had chills. There are no localizing signs or symptoms such as sore throat, cough or diarrhea. Urinalysis show nothing suspicious for urinary tract infection. Blood culture and urine culture are pending. I will get him to get Chex x-ray to exclude pneumonia. I will see him back later today to review test results. In the meantime, I recommend Tylenol or naproxen for fever. Unless I have clinical proof of infection, I want to avoid giving him antibiotic treatment as they have caused recurrent infection with clostridium deficit recently.

## 2014-05-25 NOTE — H&P (Signed)
[facility] NOTE  Patient Care Team: Hulan Fess, MD as PCP - General (Family Medicine) Heath Lark, MD as Consulting Physician (Hematology and Oncology)  CHIEF COMPLAINTS/PURPOSE OF CONSULTATION:  Persistent fever, hypotension and mild change in mental status  HISTORY OF PRESENTING ILLNESS:  Tyler Willis 65 y.o. male is admitted because of symptoms above. Please see my progress note from this afternoon. This patient back on his multiple myeloma, diabetes and was receiving chemotherapy for multiple myeloma. Yesterday, he started to have high-grade fever with some associated chills. He was given Tylenol which seems to help. Blood culture and urine cultures are pending. Checks x-ray showed bunting of costophrenic angles, suspicious for possible infection. Patient is becoming more somnolent since I saw him this afternoon. He is becoming mildly hypotensive despite a liter of IV fluids and hence is being admitted for presumed pneumonia.  MEDICAL HISTORY:  Past Medical History  Diagnosis Date  . Diabetes mellitus without complication   . GERD (gastroesophageal reflux disease)   . 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 Multiple myeloma 02/19/2014    SURGICAL HISTORY: Past Surgical History  Procedure Laterality Date  . Testicle removal Right 2004    SOCIAL HISTORY: History   Social History  . Marital Status: Married    Spouse Name: N/A  . Number of Children: N/A  . Years of Education: N/A   Occupational History  . Not on file.   Social History Main Topics  . Smoking status: Former Smoker    Types: Pipe  . Smokeless tobacco: Never Used  . Alcohol Use: No  . Drug Use: No  . Sexual Activity: Not on file   Other Topics Concern  . Not on file   Social History Narrative    FAMILY HISTORY: Family History  Problem Relation Age of Onset  . Diabetes Father   . Cancer Maternal Uncle     lung cancer  . Cancer Cousin     myeloma    ALLERGIES:  has No Known  Allergies.  MEDICATIONS:  Current Outpatient Prescriptions  Medication Sig Dispense Refill  . acetaminophen (TYLENOL) 325 MG tablet Take 2 tablets (650 mg total) by mouth every 6 (six) hours as needed for mild pain (or Fever >/= 101). 30 tablet 0  . BAYER CONTOUR NEXT TEST test strip   5  . CALCIUM PO Take 1,000 mg by mouth daily.    . Cholecalciferol (VITAMIN D3) 2000 UNITS TABS Take 1 tablet by mouth daily.     Marland Kitchen dexamethasone (DECADRON) 4 MG tablet Take 10 pills every Monday with breakfast 90 tablet 1  . folic acid (FOLVITE) 1 MG tablet Take 1 tablet (1 mg total) by mouth daily. 30 tablet 2  . guaiFENesin (ROBITUSSIN) 100 MG/5ML SOLN Take 10 mLs (200 mg total) by mouth every 4 (four) hours as needed. 1200 mL 0  . insulin glargine (LANTUS) 100 UNIT/ML injection Inject 5-6 Units into the skin at bedtime. Take 5 units at Bedtime Everyday Except on Mondays Take 6 units.    Marland Kitchen lenalidomide (REVLIMID) 10 MG capsule Take 1 capsule (10 mg total) by mouth daily. 14 capsule 0  . Magnesium 400 MG TABS Take 400 mg by mouth daily.    . metFORMIN (GLUCOPHAGE) 500 MG tablet Take 500 mg by mouth 2 (two) times daily with a meal.    . Multiple Vitamin (MULTIVITAMIN) tablet Take 1 tablet by mouth daily.    . naproxen sodium (ANAPROX) 220 MG tablet  Take 440 mg by mouth daily as needed (pain).     Marland Kitchen omeprazole (PRILOSEC) 20 MG capsule Take 20 mg by mouth daily.    Marland Kitchen oxyCODONE (OXY IR/ROXICODONE) 5 MG immediate release tablet Take 1 tablet (5 mg total) by mouth every 6 (six) hours as needed for severe pain. 30 tablet 0  . pravastatin (PRAVACHOL) 40 MG tablet Take 40 mg by mouth daily. Daily  1  . prochlorperazine (COMPAZINE) 10 MG tablet Take 1 tablet (10 mg total) by mouth every 6 (six) hours as needed (Nausea or vomiting). 30 tablet 1  . Pseudoephedrine-APAP-DM (DAYQUIL PO) Take 30 mLs by mouth daily as needed (cold symptoms).     . saccharomyces boulardii (FLORASTOR) 250 MG capsule Take 1 capsule (250 mg  total) by mouth 2 (two) times daily. 60 capsule 2   No current facility-administered medications for this visit.    REVIEW OF SYSTEMS:   Constitutional: Denies abnormal night sweats Eyes: Denies blurriness of vision, double vision or watery eyes Ears, nose, mouth, throat, and face: Denies mucositis or sore throat Respiratory: Denies cough, dyspnea or wheezes Cardiovascular: Denies palpitation, chest discomfort or lower extremity swelling Gastrointestinal:  Denies nausea, heartburn or change in bowel habits Skin: Denies abnormal skin rashes Lymphatics: Denies new lymphadenopathy or easy bruising Neurological:Denies numbness, tingling or new weaknesses Behavioral/Psych: Mood is stable, no new changes  All other systems were reviewed with the patient and are negative.  PHYSICAL EXAMINATION: ECOG PERFORMANCE STATUS: 3 - Symptomatic, >50% confined to bed  Filed Vitals:   05/25/14 1236  BP: 128/62  Pulse: 89  Temp: 102.7 F (39.3 C)  Resp: 22   Filed Weights   05/25/14 1236  Weight: 197 lb 3.2 oz (89.449 kg)    GENERAL:alert, no distress and comfortable. He looked slightly somnolent and ill-appearing SKIN: skin color, texture, turgor are normal, no rashes or significant lesions EYES: normal, conjunctiva are pink and non-injected, sclera clear OROPHARYNX:no exudate, no erythema and lips, buccal mucosa, and tongue normal  NECK: supple, thyroid normal size, non-tender, without nodularity LYMPH:  no palpable lymphadenopathy in the cervical, axillary or inguinal LUNGS: clear to auscultation and percussion with normal breathing effort HEART: regular rate & rhythm and no murmurs and no lower extremity edema ABDOMEN:abdomen soft, non-tender and normal bowel sounds Musculoskeletal:no cyanosis of digits and no clubbing  PSYCH: alert & oriented x 3 with fluent speech NEURO: no focal motor/sensory deficits  LABORATORY DATA:  I have reviewed the data as listed Lab Results  Component  Value Date   WBC 6.3 05/25/2014   HGB 10.7* 05/25/2014   HCT 32.6* 05/25/2014   MCV 91.8 05/25/2014   PLT 173 05/25/2014    Recent Labs  04/21/14 0550  05/04/14 1951 05/05/14 0554 05/07/14 0852 05/17/14 1047 05/25/14 1212  NA 137  < > 137 138 138 138 133*  K 4.2  < > 4.1 4.0 4.0 3.9 3.5  CL 104  --  105 105  --   --   --   CO2 26  < > 25 27 22 27 26   GLUCOSE 90  < > 182* 94 155* 83 80  BUN 13  < > 18 14 16.1 15.4 15.7  CREATININE 1.06  < > 1.12 1.03 1.0 1.1 1.2  CALCIUM 8.5  < > 8.6 8.3* 9.1 9.6 8.8  GFRNONAA 74*  --  69* 76*  --   --   --   GFRAA 86*  --  80* 89*  --   --   --  PROT  --   < > 6.9 6.8 7.2 7.7 7.5  ALBUMIN  --   < > 3.8 3.7 3.8 4.0 3.5  AST  --   < > 30 29 35* 30 32  ALT  --   < > 26 24 31 26 31   ALKPHOS  --   < > 61 58 64 70 60  BILITOT  --   < > 0.3 0.6 0.39 0.87 0.89  < > = values in this interval not displayed.  RADIOGRAPHIC STUDIES: I have personally reviewed the radiological images as listed and agreed with the findings in the report. Dg Chest 2 View  05/25/2014   CLINICAL DATA:  Fever.  EXAM: CHEST  2 VIEW  COMPARISON:  04/24/2014.  03/22/2014.  FINDINGS: Mediastinum and hilar structures are normal. Bibasilar subsegmental atelectasis and/or scarring again noted. No focal infiltrate. No pleural effusion or pneumothorax. Heart size stable. No acute bony abnormality .  IMPRESSION: Mild bibasilar subsegmental atelectasis and/or scarring. No acute abnormality identified. Exam stable from multiple prior exams.   Electronically Signed   By: Marcello Moores  Register   On: 05/25/2014 15:12   Ct Abdomen Pelvis W Contrast  05/05/2014   CLINICAL DATA:  Acute onset of fever, chills and fatigue. Right upper quadrant abdominal pain and blood in stool. Current history of multiple myeloma, currently on chemotherapy. Initial encounter.  EXAM: CT ABDOMEN AND PELVIS WITH CONTRAST  TECHNIQUE: Multidetector CT imaging of the abdomen and pelvis was performed using the standard  protocol following bolus administration of intravenous contrast.  CONTRAST:  133m OMNIPAQUE IOHEXOL 300 MG/ML  SOLN  COMPARISON:  PET/CT performed 02/14/2014, and CT of the chest, abdomen and pelvis from 02/02/2014  FINDINGS: Minimal bibasilar atelectasis is noted.  A few small hypodensities are seen within the liver, measuring up to 1.2 cm in size. These are nonspecific but may reflect small cysts. The spleen is unremarkable in appearance. The gallbladder is within normal limits. The pancreas and adrenal glands are unremarkable.  Scattered small right renal cysts are seen, measuring up to 2.2 cm in size. Mild left-sided renal pelvicaliectasis remains within normal limits. There is no evidence of hydronephrosis. No renal or ureteral stones are seen. No perinephric stranding is appreciated.  No free fluid is identified. The small bowel is unremarkable in appearance. The stomach is within normal limits. No acute vascular abnormalities are seen.  Scattered diverticulosis is noted along the distal descending and sigmoid colon, without evidence of diverticulitis. Minimal wall thickening along the descending and proximal sigmoid colon may reflect recent C. difficile colitis, given the patient's history.  The appendix is grossly unremarkable in appearance. There is no evidence of appendicitis.  The bladder is moderately distended and grossly unremarkable. The prostate is mildly enlarged, measuring 5.4 cm in transverse dimension. No inguinal lymphadenopathy is seen.  A lytic lesion is noted at the right side of vertebral body L2, as previously noted. There is grade 2 anterolisthesis of L5 on S1, reflecting underlying facet disease.  IMPRESSION: 1. No acute abnormality seen to explain the patient's symptoms. 2. Fractures of the right posterolateral ninth and tenth ribs, new from the prior study; previously noted rib fractures are seen slightly more laterally. These are likely pathologic in nature, as previously described.  3. Lytic lesion again noted at the right side of vertebral body L2, reflecting multiple myeloma. 4. Scattered diverticulosis along the distal descending and sigmoid colon, without evidence of diverticulitis. Minimal wall thickening along the descending and proximal sigmoid colon  may reflect recent C. difficile colitis, given the patient's history. 5. Scattered small right renal cysts noted. 6. Few small hypodensities within the liver, measuring up to 1.2 cm. These are nonspecific but may reflect small cysts. 7. Mildly enlarged prostate noted. 8. Grade 2 anterolisthesis of L5 on S1, reflecting underlying facet disease. This is grossly stable from prior studies.   Electronically Signed   By: Garald Balding M.D.   On: 05/05/2014 01:23   Dg Chest Port 1 View  05/04/2014   CLINICAL DATA:  Patient with multiple myeloma all presenting with fever  EXAM: PORTABLE CHEST - 1 VIEW  COMPARISON:  April 19, 2014  FINDINGS: There is bibasilar atelectatic change. There is no frank edema or consolidation. Heart size and pulmonary vascularity are normal. No adenopathy. There are multiple small lytic lesions in both clavicles and superior scapulae as well as in multiple ribs consistent with multiple myeloma.  IMPRESSION: Bony evidence of multiple myeloma. Bibasilar atelectatic change, more on the left than on the right. No consolidation. No new opacity.   Electronically Signed   By: Lowella Grip III M.D.   On: 05/04/2014 20:58   Dg Shoulder Left  05/05/2014   CLINICAL DATA:  Left shoulder pain for 2 weeks.  Multiple myeloma.  EXAM: LEFT SHOULDER - 2+ VIEW  COMPARISON:  None.  FINDINGS: No evidence of acute fracture or dislocation involving the shoulder. Multiple small lucent lesions are seen involving the left humerus, scapula, and clavicle, consistent with known multiple myeloma.  Fracture deformities of the left posterior fifth and seventh ribs are noted, which are of indeterminate age.  IMPRESSION: Multiple myeloma  involving the left shoulder. No evidence of acute shoulder fracture or dislocation.  Left posterior fifth and seventh rib fracture deformities, which are of indeterminate age.   Electronically Signed   By: Earle Gell M.D.   On: 05/05/2014 13:37    ASSESSMENT & PLAN:  Multiple myeloma I have recently presented his case at the hematology tumor before. He is Cytoxan was discontinued but yet the patient still spiked fever. He received 1 dose of IVIG with the hope of preventing infection on 05/21/2014 and yet he spiked another fever. By the process of elimination, another possibility would be whether he has reaction towards Velcade. I will continue supportive care. In the meantime, I told him to hold off taking Revlimid and prednisone.  Fever, abnormal chest x-ray, suspicious for possible pneumonia The patient has recurrent fever, high-grade at 102.7. He also had chills. There are no localizing signs or symptoms such as sore throat, cough or diarrhea. Urinalysis show nothing suspicious for urinary tract infection. Blood culture and urine culture are pending. Chest x-ray showed possible pneumonia. I will start him on Rocephin. I try to avoid broad-spectrum IV antibiotics and it has caused recent C. Difficile infection.  Anemia in neoplastic disease This is likely due to recent treatment. The patient denies recent history of bleeding such as epistaxis, hematuria or hematochezia. He is asymptomatic from the anemia. I will observe for now. He does not require transfusion now.   Weakness generalized The cause is unknown. I recommend IV fluids.   DVT prophylaxis I will put him on Lovenox  CODE STATUS Full code  All questions were answered. The patient knows to call the clinic with any problems, questions or concerns.    [facility], [place], MD 05/25/2014 4:18 PM      Please see my progress note from the outpatient clinic today. This gentleman is being  admitted because he has persistent fever  102.5 as well as clinical hypotension. Takes extra performed this afternoon show blunting of costophrenic angle suspicious for infection. The patient is being admitted to the hospital

## 2014-05-25 NOTE — Assessment & Plan Note (Signed)
This is likely due to recent treatment. The patient denies recent history of bleeding such as epistaxis, hematuria or hematochezia. He is asymptomatic from the anemia. I will observe for now.  He does not require transfusion now. 

## 2014-05-26 ENCOUNTER — Inpatient Hospital Stay (HOSPITAL_COMMUNITY): Payer: BC Managed Care – PPO

## 2014-05-26 DIAGNOSIS — E119 Type 2 diabetes mellitus without complications: Secondary | ICD-10-CM

## 2014-05-26 DIAGNOSIS — N508 Other specified disorders of male genital organs: Secondary | ICD-10-CM

## 2014-05-26 DIAGNOSIS — R5081 Fever presenting with conditions classified elsewhere: Secondary | ICD-10-CM

## 2014-05-26 DIAGNOSIS — C9000 Multiple myeloma not having achieved remission: Secondary | ICD-10-CM

## 2014-05-26 LAB — BASIC METABOLIC PANEL
Anion gap: 4 — ABNORMAL LOW (ref 5–15)
BUN: 18 mg/dL (ref 6–23)
CALCIUM: 7.8 mg/dL — AB (ref 8.4–10.5)
CO2: 26 mmol/L (ref 19–32)
CREATININE: 1.31 mg/dL (ref 0.50–1.35)
Chloride: 104 mmol/L (ref 96–112)
GFR calc Af Amer: 66 mL/min — ABNORMAL LOW (ref >90)
GFR calc non Af Amer: 57 mL/min — ABNORMAL LOW (ref >90)
Glucose, Bld: 87 mg/dL (ref 70–99)
POTASSIUM: 3.5 mmol/L (ref 3.5–5.1)
Sodium: 134 mmol/L — ABNORMAL LOW (ref 135–145)

## 2014-05-26 LAB — GLUCOSE, CAPILLARY
GLUCOSE-CAPILLARY: 83 mg/dL (ref 70–99)
GLUCOSE-CAPILLARY: 109 mg/dL — AB (ref 70–99)
Glucose-Capillary: 106 mg/dL — ABNORMAL HIGH (ref 70–99)
Glucose-Capillary: 129 mg/dL — ABNORMAL HIGH (ref 70–99)

## 2014-05-26 LAB — CBC
HCT: 30.1 % — ABNORMAL LOW (ref 39.0–52.0)
Hemoglobin: 10 g/dL — ABNORMAL LOW (ref 13.0–17.0)
MCH: 30.5 pg (ref 26.0–34.0)
MCHC: 33.2 g/dL (ref 30.0–36.0)
MCV: 91.8 fL (ref 78.0–100.0)
PLATELETS: 129 10*3/uL — AB (ref 150–400)
RBC: 3.28 MIL/uL — AB (ref 4.22–5.81)
RDW: 15.6 % — ABNORMAL HIGH (ref 11.5–15.5)
WBC: 5.7 10*3/uL (ref 4.0–10.5)

## 2014-05-26 MED ORDER — MAGNESIUM OXIDE 400 (241.3 MG) MG PO TABS
400.0000 mg | ORAL_TABLET | Freq: Every day | ORAL | Status: DC
Start: 2014-05-26 — End: 2014-05-28
  Administered 2014-05-26 – 2014-05-28 (×3): 400 mg via ORAL
  Filled 2014-05-26 (×3): qty 1

## 2014-05-26 NOTE — Plan of Care (Signed)
Problem: Phase I Progression Outcomes Goal: Hemodynamically stable Outcome: Progressing Febrile with temp max >103 last night.

## 2014-05-26 NOTE — Progress Notes (Signed)
Mr. Craighead was admitted yesterday. He spiked another temperature is now patient.  He has myeloma. He is on Velcade and Revlimid. He currently is on Rocephin. So far, cultures are not yet back.  He is complaining of some pain in the left scrotal area. There is no swelling in this area. On exam, he has some slight tenderness. I suppose a testicular ultrasound can be done to see there is any type of epididymitis.  He does have diabetes.  He had a chest x-ray done yesterday. No obvious infiltrate was noted. He has some slight bibasilar atelectasis. He has no cough. There is no shortness of breath.   He is not neutropenic. White cell count is 5.7. Hemoglobin 10. Platelet count is 129.  His calcium is 7.8. Albumin is 3.5.  On his physical exam, his temperature is 99.6. Pulse is 79. Blood pressure is 105/68. Head and neck exam shows no mucitis. He has no adenopathy in the neck. Lungs are clear. He has no rales, wheezes or rhonchi. Cardiac exam regular rate and rhythm with no murmurs, rubs or bruits. Abdomen is soft. There is no palpable liver or spleen. Extremities shows no clubbing, cyanosis or edema. Skin exam shows no rashes, ecchymoses or petechia. Neurological exam is nonfocal. Scrotal exam shows no scrotal swelling or erythema. There is some slight tenderness to palpation over the left testicle  We will await the culture results. Again, he's had some testicular pain. I don't think would be a bad idea to check a scrotal ultrasound.  He is not neutropenic. There is no obvious source for the infection.  He did get IVIG earlier this week.  Pete E.

## 2014-05-27 DIAGNOSIS — N451 Epididymitis: Secondary | ICD-10-CM

## 2014-05-27 LAB — COMPREHENSIVE METABOLIC PANEL
ALBUMIN: 3.7 g/dL (ref 3.5–5.2)
ALT: 29 U/L (ref 0–53)
ALT: 32 U/L (ref 0–53)
AST: 35 U/L (ref 0–37)
AST: 37 U/L (ref 0–37)
Albumin: 3.2 g/dL — ABNORMAL LOW (ref 3.5–5.2)
Alkaline Phosphatase: 41 U/L (ref 39–117)
Alkaline Phosphatase: 47 U/L (ref 39–117)
Anion gap: 6 (ref 5–15)
Anion gap: 5 (ref 5–15)
BUN: 13 mg/dL (ref 6–23)
BUN: 12 mg/dL (ref 6–23)
CO2: 25 mmol/L (ref 19–32)
CO2: 27 mmol/L (ref 19–32)
Calcium: 8.4 mg/dL (ref 8.4–10.5)
Calcium: 8.6 mg/dL (ref 8.4–10.5)
Chloride: 106 mmol/L (ref 96–112)
Chloride: 107 mmol/L (ref 96–112)
Creatinine, Ser: 1.14 mg/dL (ref 0.50–1.35)
Creatinine, Ser: 1.1 mg/dL (ref 0.50–1.35)
GFR calc Af Amer: 78 mL/min — ABNORMAL LOW (ref >90)
GFR calc Af Amer: 81 mL/min — ABNORMAL LOW (ref >90)
GFR calc non Af Amer: 67 mL/min — ABNORMAL LOW (ref >90)
GFR calc non Af Amer: 70 mL/min — ABNORMAL LOW (ref >90)
GLUCOSE: 127 mg/dL — AB (ref 70–99)
Glucose, Bld: 102 mg/dL — ABNORMAL HIGH (ref 70–99)
Potassium: 3.5 mmol/L (ref 3.5–5.1)
Potassium: 3.3 mmol/L — ABNORMAL LOW (ref 3.5–5.1)
Sodium: 137 mmol/L (ref 135–145)
Sodium: 139 mmol/L (ref 135–145)
Total Bilirubin: 0.6 mg/dL (ref 0.3–1.2)
Total Bilirubin: 0.5 mg/dL (ref 0.3–1.2)
Total Protein: 7 g/dL (ref 6.0–8.3)
Total Protein: 7.9 g/dL (ref 6.0–8.3)

## 2014-05-27 LAB — CBC
HCT: 33.2 % — ABNORMAL LOW (ref 39.0–52.0)
Hemoglobin: 10.8 g/dL — ABNORMAL LOW (ref 13.0–17.0)
MCH: 30.3 pg (ref 26.0–34.0)
MCHC: 32.5 g/dL (ref 30.0–36.0)
MCV: 93.3 fL (ref 78.0–100.0)
Platelets: 128 10*3/uL — ABNORMAL LOW (ref 150–400)
RBC: 3.56 MIL/uL — ABNORMAL LOW (ref 4.22–5.81)
RDW: 15.8 % — ABNORMAL HIGH (ref 11.5–15.5)
WBC: 3.9 10*3/uL — ABNORMAL LOW (ref 4.0–10.5)

## 2014-05-27 LAB — GLUCOSE, CAPILLARY
GLUCOSE-CAPILLARY: 77 mg/dL (ref 70–99)
GLUCOSE-CAPILLARY: 109 mg/dL — AB (ref 70–99)
GLUCOSE-CAPILLARY: 124 mg/dL — AB (ref 70–99)
Glucose-Capillary: 88 mg/dL (ref 70–99)

## 2014-05-27 LAB — URINE CULTURE

## 2014-05-27 LAB — CLOSTRIDIUM DIFFICILE BY PCR: Toxigenic C. Difficile by PCR: POSITIVE — AB

## 2014-05-27 MED ORDER — VANCOMYCIN 50 MG/ML ORAL SOLUTION: 125.0000 mg | ORAL | Status: DC

## 2014-05-27 MED ORDER — VANCOMYCIN 50 MG/ML ORAL SOLUTION: 125.0000 mg | Freq: Two times a day (BID) | ORAL | Status: DC

## 2014-05-27 MED ORDER — VANCOMYCIN 50 MG/ML ORAL SOLUTION: 125.0000 mg | Freq: Every day | ORAL | Status: DC

## 2014-05-27 MED ORDER — LEVOFLOXACIN IN D5W 500 MG/100ML IV SOLN
500.0000 mg | INTRAVENOUS | Status: DC
  Administered 2014-05-27 – 2014-05-28 (×2): 500 mg via INTRAVENOUS
  Filled 2014-05-27 (×2): qty 100

## 2014-05-27 MED ORDER — VANCOMYCIN 50 MG/ML ORAL SOLUTION
125.0000 mg | Freq: Four times a day (QID) | ORAL | Status: DC
  Administered 2014-05-27 – 2014-05-28 (×2): 125 mg via ORAL
  Filled 2014-05-27 (×5): qty 2.5

## 2014-05-27 NOTE — Progress Notes (Signed)
Tyler Willis is doing okay. He had a scrotal ultrasound yesterday which did show epididymitis on the left side. I will go ahead and start him on Levaquin.  His cultures, so far, have come back negative. His temperature is down.  His blood sugars are okay.  He is on Rocephin. I will go ahead and stop this.  he's had some loose stools but not diarrhea. I know that he had the C. difficile back in early January. He also had an episode in late January.  He seems to be eating okay. There is no nausea vomiting.  There is pretty good control with his blood sugars from his diabetes.  On his physical exam, his vital signs are stable. Temperature 98.2. Pulse 66. Blood pressure 130/79. Head and neck exam shows no mucositis. There is no adenopathy in the neck. Lungs are clear. I hear no wheezes, rales or rhonchi. Cardiac exam regular rate and rhythm with no murmurs, rubs or bruits. Abdomen is soft. He has good bowel sounds. There is no fluid wave. There is no palpable liver or spleen tip. Extremities shows no clubbing, cyanosis or edema. Skin exam shows no rashes.  He comes in with a temperature. So for, cultures are negative. He does have the epididymitis but I'm unsure of this would cause him to have a temperature.  I just wonder if some of this might not be from the Zometa that he is getting. I know that there are some patients who have temperatures after Zometa. Although it is unusual, it might be worthwhile switching him over to Aredia when he gets his next bone treatment.  If all looks good today, and maybe he can be discharged tomorrow.  His wife and daughter was with him this morning. I talked to them. They really like Dr. Alvy Bimler. They feel very confident under her care.  I appreciate all the great care that he is getting from the staff on [address].  Harriette Ohara 33:3

## 2014-05-27 NOTE — Progress Notes (Signed)
Lab called + c-diff at 2240. Dr. Marin Olp (on call MD) made aware and new orders received for po vancomycin. The patient was made aware of the new result. Will notify regular MD in the am.

## 2014-05-28 ENCOUNTER — Other Ambulatory Visit: Payer: BC Managed Care – PPO

## 2014-05-28 ENCOUNTER — Ambulatory Visit: Payer: BC Managed Care – PPO

## 2014-05-28 ENCOUNTER — Ambulatory Visit: Payer: BC Managed Care – PPO | Admitting: Hematology and Oncology

## 2014-05-28 ENCOUNTER — Telehealth: Payer: Self-pay | Admitting: Hematology and Oncology

## 2014-05-28 DIAGNOSIS — B9689 Other specified bacterial agents as the cause of diseases classified elsewhere: Secondary | ICD-10-CM

## 2014-05-28 DIAGNOSIS — D72819 Decreased white blood cell count, unspecified: Secondary | ICD-10-CM

## 2014-05-28 DIAGNOSIS — J189 Pneumonia, unspecified organism: Principal | ICD-10-CM

## 2014-05-28 LAB — CBC
HCT: 33.2 % — ABNORMAL LOW (ref 39.0–52.0)
Hemoglobin: 11 g/dL — ABNORMAL LOW (ref 13.0–17.0)
MCH: 30.6 pg (ref 26.0–34.0)
MCHC: 33.1 g/dL (ref 30.0–36.0)
MCV: 92.2 fL (ref 78.0–100.0)
PLATELETS: 125 10*3/uL — AB (ref 150–400)
RBC: 3.6 MIL/uL — AB (ref 4.22–5.81)
RDW: 15.4 % (ref 11.5–15.5)
WBC: 3.8 10*3/uL — AB (ref 4.0–10.5)

## 2014-05-28 LAB — GLUCOSE, CAPILLARY: Glucose-Capillary: 75 mg/dL (ref 70–99)

## 2014-05-28 MED ORDER — VANCOMYCIN HCL 125 MG PO CAPS: 125.0000 mg | ORAL_CAPSULE | Freq: Four times a day (QID) | ORAL | Status: DC

## 2014-05-28 NOTE — Progress Notes (Signed)
Patient was stable at time of discharge. I removed patient's IV. I reviewed discharge packet with patient and wife. They are aware of prescription that needs to be picked up. They had no further questions.

## 2014-05-28 NOTE — Progress Notes (Signed)
Tyler Willis   DOB:07/06/1950   OM#:[id number]   JGG#:[id number]  Patient Care Team: Hulan Fess, MD as PCP - General (Family Medicine) Heath Lark, MD as Consulting Physician (Hematology and Oncology)  I have seen the patient, examined him and edited the notes as follows  Subjective: Patient seen and examined. Events since 3/4 noted.  Denies fevers, chills, night sweats, vision changes, or mucositis. Denies any respiratory complaints. Denies any chest pain or palpitations. Denies lower extremity swelling. Denies nausea or vomiting. He has some loose stools, last this morning, non bloody, more formed.  Blood cultures were negative, but stool cultures positive for C diff  for which he was placed on oral vancomycin, day 2. Appetite is normal. Denies any dysuria. No further left scrotal pain (ultrasound on 3/5 showed mild left epididymitis requiring Levaquin.)  Denies abnormal skin rashes, or neuropathy. Denies any bleeding issues such as epistaxis, hematemesis, hematuria or hematochezia. Ambulating without difficulty.   Scheduled Meds: . antiseptic oral rinse  7 mL Mouth Rinse q12n4p  . calcium carbonate  1 tablet Oral Daily  . chlorhexidine  15 mL Mouth Rinse BID  . cholecalciferol  2,000 Units Oral Daily  . enoxaparin (LOVENOX) injection  40 mg Subcutaneous Q24H  . folic acid  1 mg Oral Daily  . insulin aspart  0-24 Units Subcutaneous TID WC  . insulin glargine  5 Units Subcutaneous QHS  . levofloxacin (LEVAQUIN) IV  500 mg Intravenous Q24H  . magnesium oxide  400 mg Oral Daily  . metFORMIN  500 mg Oral BID WC  . multivitamin with minerals  1 tablet Oral Daily  . pantoprazole  40 mg Oral Daily  . pravastatin  40 mg Oral Daily  . vancomycin  125 mg Oral QID   Followed by  . [START ON 06/03/2014] vancomycin  125 mg Oral BID   Followed by  . [START ON 06/11/2014] vancomycin  125 mg Oral Daily   Followed by  . [START ON 06/18/2014] vancomycin  125 mg Oral QODAY   Followed by  . [START ON  06/26/2014] vancomycin  125 mg Oral Q3 days   Continuous Infusions: . sodium chloride 75 mL/hr at 05/27/14 2254   PRN Meds:acetaminophen, alum & mag hydroxide-simeth, guaiFENesin, naproxen sodium, ondansetron **OR** ondansetron **OR** ondansetron (ZOFRAN) IV **OR** ondansetron (ZOFRAN) IV, oxyCODONE, prochlorperazine, senna-docusate   Objective:  Filed Vitals:   05/28/14 0446  BP: 131/89  Pulse: 65  Temp: 98 F (36.7 C)  Resp: 16      Intake/Output Summary (Last 24 hours) at 05/28/14 0740 Last data filed at 05/28/14 0400  Gross per 24 hour  Intake   1200 ml  Output      0 ml  Net   1200 ml    ECOG PERFORMANCE STATUS: 1  GENERAL:alert, no distress and comfortable SKIN: skin color, texture, turgor are normal, no rashes or significant lesions EYES: normal, conjunctiva are pink and non-injected, sclera clear OROPHARYNX:no exudate, no erythema and lips, buccal mucosa, and tongue normal  NECK: supple, thyroid normal size, non-tender, without nodularity LYMPH:  no palpable lymphadenopathy in the cervical, axillary or inguinal LUNGS: clear to auscultation and percussion with normal breathing effort HEART: regular rate & rhythm and no murmurs and no lower extremity edema ABDOMEN: soft, non-tender and normal bowel sounds Musculoskeletal:no cyanosis of digits and no clubbing  PSYCH: alert & oriented x 3 with fluent speech NEURO: no focal motor/sensory deficits    CBG (last 3)   Recent Labs  05/27/14  1134 05/27/14 1716 05/27/14 2136  GLUCAP 109* 88 124*     Labs:   Recent Labs Lab 05/25/14 1212 05/26/14 0424 05/27/14 0428 05/28/14 0445  WBC 6.3 5.7 3.9* 3.8*  HGB 10.7* 10.0* 10.8* 11.0*  HCT 32.6* 30.1* 33.2* 33.2*  PLT 173 129* 128* 125*  MCV 91.8 91.8 93.3 92.2  MCH 30.1 30.5 30.3 30.6  MCHC 32.8 33.2 32.5 33.1  RDW 15.3* 15.6* 15.8* 15.4  LYMPHSABS 0.7*  --   --   --   MONOABS 0.8  --   --   --   EOSABS 0.2  --   --   --   BASOSABS 0.0  --   --   --       Chemistries:    Recent Labs Lab 05/25/14 1212 05/26/14 0424 05/27/14 0428 05/27/14 1000  NA 133* 134* 137 139  K 3.5 3.5 3.5 3.3*  CL  --  104 106 107  CO2 26 26 25 27   GLUCOSE 80 87 102* 127*  BUN 15.7 18 13 12   CREATININE 1.2 1.31 1.14 1.10  CALCIUM 8.8 7.8* 8.4 8.6  AST 32  --  35 37  ALT 31  --  29 32  ALKPHOS 60  --  41 47  BILITOT 0.89  --  0.6 0.5    GFR Estimated Creatinine Clearance: 76.4 mL/min (by C-G formula based on Cr of 1.1).  Liver Function Tests:  Recent Labs Lab 05/25/14 1212 05/27/14 0428 05/27/14 1000  AST 32 35 37  ALT 31 29 32  ALKPHOS 60 41 47  BILITOT 0.89 0.6 0.5  PROT 7.5 7.0 7.9  ALBUMIN 3.5 3.2* 3.7   Urine Studies     Component Value Date/Time   COLORURINE YELLOW 05/04/2014 2019   APPEARANCEUR CLEAR 05/04/2014 2019   LABSPEC 1.005 05/25/2014 1159   LABSPEC 1.006 05/04/2014 2019   PHURINE 5.0 05/04/2014 2019   GLUCOSEU Negative 05/25/2014 [place] NEGATIVE 05/04/2014 2019   HGBUR NEGATIVE 05/04/2014 2019   BILIRUBINUR NEGATIVE 05/04/2014 2019   KETONESUR NEGATIVE 05/04/2014 2019   PROTEINUR NEGATIVE 05/04/2014 2019   UROBILINOGEN 0.2 05/25/2014 1159   UROBILINOGEN 0.2 05/04/2014 2019   NITRITE Negative 05/25/2014 1159   NITRITE NEGATIVE 05/04/2014 2019   LEUKOCYTESUR NEGATIVE 05/04/2014 2019     Recent Labs Lab 05/26/14 2139 05/27/14 0731 05/27/14 1134 05/27/14 1716 05/27/14 2136  GLUCAP 129* 77 109* 88 124*   Microbiology Recent Results (from the past 240 hour(s))  Urine culture     Status: None   Collection Time: 05/25/14 11:59 AM  Result Value Ref Range Status   Urine Culture, Routine Culture, Urine  Final    Comment: Final - ===== COLONY COUNT: ===== NO GROWTH NO GROWTH   TECHNOLOGIST REVIEW     Status: None   Collection Time: 05/25/14 12:12 PM  Result Value Ref Range Status   Technologist Review Metas and Myelocytes present  Final  Clostridium Difficile by PCR     Status: Abnormal    Collection Time: 05/27/14  7:51 PM  Result Value Ref Range Status   C difficile by pcr POSITIVE (A) NEGATIVE Final    Comment: CRITICAL RESULT CALLED TO, READ BACK BY AND VERIFIED WITH: Nicoletta Dress RN [id number] 05/27/14 A NAVARRO        Imaging Studies:  US Scrotum  05/26/2014   CLINICAL DATA:  64 year old male with left testicular pain and fever. History of right testicular cancer and right orchidectomy in 2004.  History of multiple myeloma.  EXAM: SCROTAL ULTRASOUND  DOPPLER ULTRASOUND OF THE TESTICLES  TECHNIQUE: Complete ultrasound examination of the testicles, epididymis, and other scrotal structures was performed. Color and spectral Doppler ultrasound were also utilized to evaluate blood flow to the testicles.  COMPARISON:  None.  FINDINGS: Right testicle  Not visualized compatible with the right orchidectomy.  Left testicle  Measurements: 4.6 x 2.1 x 3 cm. No mass or microlithiasis visualized.  Right epididymis:  Not visualized.  Left epididymis: Enlarged with the head measuring 2.6 x 0.7 x 1.5 cm.  Hydrocele:  A small left hydrocele with debris is noted.  Varicocele:  A left varicocele is identified.  Pulsed Doppler interrogation of both testes demonstrates normal low resistance arterial and venous waveforms within the left testicle.  IMPRESSION: Enlarged left epididymis suspicious for epididymitis. Small left hydrocele with debris which may represent infection.  Status post right orchidectomy.  Normal left testicle.  No evidence of testicular torsion or mass.  Left varicocele.   Electronically Signed   By: Margarette Canada M.D.   On: 05/26/2014 15:51   [place] Art/ven Flow Abd Pelv Doppler  05/26/2014   CLINICAL DATA:  64 year old male with left testicular pain and fever. History of right testicular cancer and right orchidectomy in 2004. History of multiple myeloma.  EXAM: SCROTAL ULTRASOUND  DOPPLER ULTRASOUND OF THE TESTICLES  TECHNIQUE: Complete ultrasound examination of the testicles, epididymis, and other  scrotal structures was performed. Color and spectral Doppler ultrasound were also utilized to evaluate blood flow to the testicles.  COMPARISON:  None.  FINDINGS: Right testicle  Not visualized compatible with the right orchidectomy.  Left testicle  Measurements: 4.6 x 2.1 x 3 cm. No mass or microlithiasis visualized.  Right epididymis:  Not visualized.  Left epididymis: Enlarged with the head measuring 2.6 x 0.7 x 1.5 cm.  Hydrocele:  A small left hydrocele with debris is noted.  Varicocele:  A left varicocele is identified.  Pulsed Doppler interrogation of both testes demonstrates normal low resistance arterial and venous waveforms within the left testicle.  IMPRESSION: Enlarged left epididymis suspicious for epididymitis. Small left hydrocele with debris which may represent infection.  Status post right orchidectomy.  Normal left testicle.  No evidence of testicular torsion or mass.  Left varicocele.   Electronically Signed   By: Margarette Canada M.D.   On: 05/26/2014 15:51    Assessment/Plan: 64 y.o.   Fever, abnormal chest x-ray, suspicious for possible pneumonia Epidymidis C difficile positive He was admitted on 3/4 with recurrent fever, high-grade at 102.7 accompanied with chills. Urinalysis was not indicative for urinary tract infection.  Chest x-ray showed possible pneumonia. He was started on Rocephin, as we tried to avoid broad-spectrum IV antibiotics and it has caused recent C. Difficile infection. He developed left epididymitis per scrotal ultrasound, for which Rocephin was discontinued on 3/6 and he was placed on Levaquin. In addition, stool cultures were positive for C difficile, and started on 3/6 on oral vancomycin, today day 2. Will DC levaquin, home with oral vancomycin X 7 days  Multiple myeloma His Cytoxan was discontinued but yet the patient still spiked fever. He received 1 dose of IVIG with the hope of preventing infection on 05/21/2014 and yet he spiked another fever. By the  process of elimination, another possibility would be whether he has reaction towards Velcade. Will continue supportive care for now. Revlimid and prednisone are on hold  Leukopenia, mild In the setting of recent treatment, infection, antibiotics Blood  and Urine cultures negative Stool positive for C diff COntinue oral antibiotics Will continue to monitor.  Anemia in neoplastic disease This is likely due to recent treatment, infection, antibiotics.  The patient denies recent history of bleeding such as epistaxis, hematuria or hematochezia. He is asymptomatic from the anemia.  He does not require transfusion now, will monitor  Weakness generalized The cause is unknown. Improved with IV fluids  DVT prophylaxis On Lovenox  CODE STATUS Full code  Disposition Likely home today with oral antibiotics.  **Disclaimer: This note was dictated with voice recognition software. Similar sounding words can inadvertently be transcribed and this note may contain transcription errors which may not have been corrected upon publication of note.Sharene Butters E, PA-C 05/28/2014  7:40 AM Dyron Kawano, MD 05/28/2014

## 2014-05-28 NOTE — Discharge Summary (Signed)
Patient ID: AUSTINE WIEDEMAN MRN: [id number] DOB/AGE: 04-21-1950 64 y.o.  Admit date: 05/25/2014 Discharge date: 05/28/2014  Patient Care Team: Hulan Fess, MD as PCP - General (Family Medicine) Heath Lark, MD as Consulting Physician (Hematology and Oncology)  I have seen the patient, examined him and edited the notes as follows  Brief History of Present Illness: For complete details please refer to admission H and P, but in brief, persistent fever, hypotension and mild change in mental status  Discharge Diagnoses/Hospital Course:  Fever, resolved Abnormal chest x-ray, suspicious for possible pneumonia Epidymimitis C difficile positive He was admitted on 3/4 with recurrent fever, high-grade at 102.7 accompanied with chills. Urinalysis was not indicative for urinary tract infection.  Chest x-ray showed possible pneumonia. He was started on Rocephin, as we tried to avoid broad-spectrum IV antibiotics and it has caused recent C. Difficile infection. He developed left epididymitis per scrotal ultrasound, for which Rocephin was discontinued on 3/6 and he was placed on Levaquin. In addition, stool cultures were positive for C difficile, and started on 3/6 on oral vancomycin, today day 2 Will discontinue Levaquin, and he can be discharged to home with oral vancomycin X 7 days  Multiple myeloma His Cytoxan was discontinued but yet the patient still spiked fever. He received 1 dose of IVIG with the hope of preventing infection on 05/21/2014 and yet he spiked another fever. By the process of elimination, another possibility would be whether he has reaction towards Velcade Will continue supportive care for now. Revlimid and prednisone are on hold  Leukopenia, mild In the setting of recent treatment, infection, antibiotics Blood and Urine cultures negative Stool positive for C. difficile Continue oral antibiotics Will continue to monitor as outpatient  Anemia in neoplastic  disease This is likely due to recent treatment, infection, antibiotics.  The patient denies recent history of bleeding such as epistaxis, hematuria or hematochezia. He is asymptomatic from the anemia.  He does not require transfusion now, will monitor as outpatient  Weakness generalized, improved Mild mental status changes, resolved The cause is unknown. Improved with IV fluids and mobility  DVT prophylaxis He received Lovenox prophylaxis during his hospitalization without bleeding issues reported.  CODE STATUS Full code   Past Medical History  Diagnosis Date  . Diabetes mellitus without complication   . GERD (gastroesophageal reflux disease)   . 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 Multiple myeloma 02/19/2014    Discharge Medications:    Medication List    STOP taking these medications        DAYQUIL PO     dexamethasone 4 MG tablet  Commonly known as:  DECADRON     guaiFENesin 100 MG/5ML Soln  Commonly known as:  ROBITUSSIN     lenalidomide 10 MG capsule  Commonly known as:  REVLIMID     naproxen sodium 220 MG tablet  Commonly known as:  ANAPROX     PROCTOZONE-HC 2.5 % rectal cream  Generic drug:  hydrocortisone      TAKE these medications        acetaminophen 325 MG tablet  Commonly known as:  TYLENOL  Take 2 tablets (650 mg total) by mouth every 6 (six) hours as needed for mild pain (or Fever >/= 101).     CALCIUM PO  Take 1,000 mg by mouth daily.     folic acid 1 MG tablet  Commonly known as:  FOLVITE  Take 1 tablet (1 mg total) by mouth daily.  insulin glargine 100 UNIT/ML injection  Commonly known as:  LANTUS  Inject 5-6 Units into the skin at bedtime. Take 5 units at Bedtime Everyday Except on Days of Chemo Take 6 units if blood sugar rises     Magnesium 400 MG Tabs  Take 400 mg by mouth daily.     metFORMIN 500 MG tablet  Commonly known as:  GLUCOPHAGE  Take 500 mg by mouth 2 (two) times daily with a meal. On days of Chemo only  increase  dose to 1046m in the evening as needed for rise in blood sugar levels     multivitamin tablet  Take 1 tablet by mouth daily.     omeprazole 20 MG capsule  Commonly known as:  PRILOSEC  Take 20 mg by mouth daily.     oxyCODONE 5 MG immediate release tablet  Commonly known as:  Oxy IR/ROXICODONE  Take 1 tablet (5 mg total) by mouth every 6 (six) hours as needed for severe pain.     pravastatin 40 MG tablet  Commonly known as:  PRAVACHOL  Take 40 mg by mouth daily. Daily     prochlorperazine 10 MG tablet  Commonly known as:  COMPAZINE  Take 1 tablet (10 mg total) by mouth every 6 (six) hours as needed (Nausea or vomiting).     saccharomyces boulardii 250 MG capsule  Commonly known as:  FLORASTOR  Take 1 capsule (250 mg total) by mouth 2 (two) times daily.     vancomycin 125 MG capsule  Commonly known as:  VANCOCIN  Take 1 capsule (125 mg total) by mouth 4 (four) times daily.     Vitamin D3 2000 UNITS Tabs  Take 1 tablet by mouth daily.        Discharge Condition: Improved.  Diet recommendation:  Carbohydrate-modified.   Disposition and Follow-up: Office will call   ECOG PERFORMANCE STATUS: 1  Physical Exam at Discharge:  Subjective:   Denies fevers, chills, night sweats, vision changes, or mucositis. Denies any respiratory complaints. Denies any chest pain or palpitations. Denies lower extremity swelling. Denies nausea or vomiting. He has some loose stools, last this morning, non bloody, more formed. Appetite is normal. Denies any dysuria. No further left scrotal pain. Denies abnormal skin rashes, or neuropathy. Denies any bleeding issues such as epistaxis, hematemesis, hematuria or hematochezia. Ambulating without difficulty.  Objective:  BP 131/89 mmHg  Pulse 65  Temp(Src) 98 F (36.7 C) (Oral)  Resp 16  Ht 6' (1.829 m)  Wt 197 lb (89.359 kg)  BMI 26.71 kg/m2  SpO2 96%   GENERAL:alert, no distress and comfortable SKIN: skin color, texture, turgor are  normal, no rashes or significant lesions EYES: normal, conjunctiva are pink and non-injected, sclera clear OROPHARYNX:no exudate, no erythema and lips, buccal mucosa, and tongue normal  NECK: supple, thyroid normal size, non-tender, without nodularity LYMPH:  no palpable lymphadenopathy in the cervical, axillary or inguinal area LUNGS: clear to auscultation and percussion with normal breathing effort HEART: regular rate & rhythm and no murmurs and no lower extremity edema ABDOMEN:abdomen soft, non-tender and normal bowel sounds Musculoskeletal:no cyanosis of digits and no clubbing  PSYCH: alert & oriented x 3 with fluent speech NEURO: no focal motor/sensory deficits    Significant Diagnostic Studies:  Dg Chest 2 View  05/25/2014    COMPARISON:  04/24/2014.  03/22/2014.  FINDINGS: Mediastinum and hilar structures are normal. Bibasilar subsegmental atelectasis and/or scarring again noted. No focal infiltrate. No pleural effusion or pneumothorax. Heart size  stable. No acute bony abnormality .  IMPRESSION: Mild bibasilar subsegmental atelectasis and/or scarring. No acute abnormality identified. Exam stable from multiple prior exams.   Electronically Signed   By: Marcello Moores  Register   On: 05/25/2014 15:12    [place] Art/ven Flow Abd Pelv Doppler  05/26/2014   SCROTAL ULTRASOUND  DOPPLER ULTRASOUND OF THE TESTICLES  TECHNIQUE: Complete ultrasound examination of the testicles, epididymis, and other scrotal structures was performed. Color and spectral Doppler ultrasound were also utilized to evaluate blood flow to the testicles.  COMPARISON:  None.  FINDINGS: Right testicle  Not visualized compatible with the right orchidectomy.  Left testicle  Measurements: 4.6 x 2.1 x 3 cm. No mass or microlithiasis visualized.  Right epididymis:  Not visualized.  Left epididymis: Enlarged with the head measuring 2.6 x 0.7 x 1.5 cm.  Hydrocele:  A small left hydrocele with debris is noted.  Varicocele:  A left varicocele is  identified.  Pulsed Doppler interrogation of both testes demonstrates normal low resistance arterial and venous waveforms within the left testicle.  IMPRESSION: Enlarged left epididymis suspicious for epididymitis. Small left hydrocele with debris which may represent infection.  Status post right orchidectomy.  Normal left testicle.  No evidence of   Discharge Laboratory Values:  CBC  Recent Labs Lab 05/25/14 1212 05/26/14 0424 05/27/14 0428 05/28/14 0445  WBC 6.3 5.7 3.9* 3.8*  HGB 10.7* 10.0* 10.8* 11.0*  HCT 32.6* 30.1* 33.2* 33.2*  PLT 173 129* 128* 125*  MCV 91.8 91.8 93.3 92.2  MCH 30.1 30.5 30.3 30.6  MCHC 32.8 33.2 32.5 33.1  RDW 15.3* 15.6* 15.8* 15.4  LYMPHSABS 0.7*  --   --   --   MONOABS 0.8  --   --   --   EOSABS 0.2  --   --   --   BASOSABS 0.0  --   --   --     Anemia panel:  No results for input(s): VITAMINB12, FOLATE, FERRITIN, TIBC, IRON, RETICCTPCT in the last 72 hours.   Chemistries   Recent Labs Lab 05/25/14 1212 05/26/14 0424 05/27/14 0428 05/27/14 1000  NA 133* 134* 137 139  K 3.5 3.5 3.5 3.3*  CL  --  104 106 107  CO2 26 26 25 27   GLUCOSE 80 87 102* 127*  BUN 15.7 18 13 12   CREATININE 1.2 1.31 1.14 1.10  CALCIUM 8.8 7.8* 8.4 8.6    Signed: Sharene Butters, PA-C 05/28/2014, 10:19 AM Mahitha Hickling, MD 05/28/2014

## 2014-05-28 NOTE — Telephone Encounter (Signed)
s.w pt and advised on cx appt today and moved to 3.14.Marland KitchenMarland Kitchenpt ok and aware

## 2014-05-31 ENCOUNTER — Ambulatory Visit: Payer: BC Managed Care – PPO

## 2014-05-31 LAB — CULTURE, BLOOD (SINGLE)

## 2014-06-04 ENCOUNTER — Ambulatory Visit (HOSPITAL_BASED_OUTPATIENT_CLINIC_OR_DEPARTMENT_OTHER): Payer: BC Managed Care – PPO | Admitting: Hematology and Oncology

## 2014-06-04 ENCOUNTER — Other Ambulatory Visit (HOSPITAL_BASED_OUTPATIENT_CLINIC_OR_DEPARTMENT_OTHER): Payer: BC Managed Care – PPO

## 2014-06-04 ENCOUNTER — Telehealth: Payer: Self-pay | Admitting: Hematology and Oncology

## 2014-06-04 VITALS — BP 129/75 | HR 94 | Temp 98.3°F | Resp 18 | Ht 72.0 in | Wt 191.2 lb

## 2014-06-04 DIAGNOSIS — C9000 Multiple myeloma not having achieved remission: Secondary | ICD-10-CM

## 2014-06-04 DIAGNOSIS — D630 Anemia in neoplastic disease: Secondary | ICD-10-CM

## 2014-06-04 DIAGNOSIS — R509 Fever, unspecified: Secondary | ICD-10-CM

## 2014-06-04 DIAGNOSIS — A0472 Enterocolitis due to Clostridium difficile, not specified as recurrent: Secondary | ICD-10-CM

## 2014-06-04 DIAGNOSIS — A047 Enterocolitis due to Clostridium difficile: Secondary | ICD-10-CM

## 2014-06-04 LAB — CBC WITH DIFFERENTIAL/PLATELET
BASO%: 0.5 % (ref 0.0–2.0)
BASOS ABS: 0.1 10*3/uL (ref 0.0–0.1)
EOS%: 2 % (ref 0.0–7.0)
Eosinophils Absolute: 0.2 10*3/uL (ref 0.0–0.5)
HCT: 36.5 % — ABNORMAL LOW (ref 38.4–49.9)
HGB: 12.1 g/dL — ABNORMAL LOW (ref 13.0–17.1)
LYMPH%: 23.7 % (ref 14.0–49.0)
MCH: 30.2 pg (ref 27.2–33.4)
MCHC: 33.1 g/dL (ref 32.0–36.0)
MCV: 91.3 fL (ref 79.3–98.0)
MONO#: 2.1 10*3/uL — ABNORMAL HIGH (ref 0.1–0.9)
MONO%: 17.5 % — AB (ref 0.0–14.0)
NEUT#: 6.7 10*3/uL — ABNORMAL HIGH (ref 1.5–6.5)
NEUT%: 56.3 % (ref 39.0–75.0)
Platelets: 324 10*3/uL (ref 140–400)
RBC: 4 10*6/uL — AB (ref 4.20–5.82)
RDW: 15.7 % — AB (ref 11.0–14.6)
WBC: 11.8 10*3/uL — ABNORMAL HIGH (ref 4.0–10.3)
lymph#: 2.8 10*3/uL (ref 0.9–3.3)

## 2014-06-04 LAB — COMPREHENSIVE METABOLIC PANEL (CC13)
ALT: 26 U/L (ref 0–55)
AST: 24 U/L (ref 5–34)
Albumin: 3.9 g/dL (ref 3.5–5.0)
Alkaline Phosphatase: 60 U/L (ref 40–150)
Anion Gap: 10 meq/L (ref 3–11)
BUN: 17.2 mg/dL (ref 7.0–26.0)
CO2: 29 meq/L (ref 22–29)
Calcium: 10.3 mg/dL (ref 8.4–10.4)
Chloride: 100 meq/L (ref 98–109)
Creatinine: 1.1 mg/dL (ref 0.7–1.3)
EGFR: 72 mL/min/{1.73_m2} — ABNORMAL LOW
Glucose: 124 mg/dL (ref 70–140)
Potassium: 3.7 meq/L (ref 3.5–5.1)
Sodium: 139 meq/L (ref 136–145)
Total Bilirubin: 0.92 mg/dL (ref 0.20–1.20)
Total Protein: 8.3 g/dL (ref 6.4–8.3)

## 2014-06-04 NOTE — Assessment & Plan Note (Signed)
This is resolved, presumably related to his recent treatment. I will enter both Zometa and Velcade as potential allergies , could be the cause of his fever.

## 2014-06-04 NOTE — Assessment & Plan Note (Signed)
He has recurrence of C. Difficile infection. He will complete his treatment with vancomycin today. Continue supportive care.

## 2014-06-04 NOTE — Progress Notes (Signed)
[facility] OFFICE PROGRESS NOTE  Patient Care Team: Hulan Fess, MD as PCP - General (Family Medicine) Heath Lark, MD as Consulting Physician (Hematology and Oncology)  SUMMARY OF ONCOLOGIC HISTORY: Oncology History   ISS Staging II, M-spike 1.7, IgG 2420, kappa 30.1, beta50mcroglobulin 4.48, Creatinine 1.2, calcium 9.8, hemoglobin 13.2, albumin 4.2     Multiple myeloma   01/10/2003 Pathology Results [id number] pathology showed pure seminoma with lymphovascular invasion, pT2 NX MX   01/10/2003 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 congo red staing reveals amyloid deposition within some of the amorphous material and within blood vessels. Normal cytogenetics.   02/14/2014 Imaging PET CT scan showed diffuse skeletal involvement   02/26/2014 - 04/25/2014 Chemotherapy He is started on chemotherapy with Velcade, Cytoxan and dexamethasone. Cytoxan was discontinued due to recurrent admission and recent clostridium Diff infection.  Velcade was suspected to be a cause of fever that was discontinued.   03/09/2014 - 03/11/2014 Hospital Admission He was admitted to the hospital with fevers and chills and was diagnosed with pneumonia   03/22/2014 - 03/26/2014 Hospital Admission He is admitted to the hospital again for fever, chills and possible pneumonia   04/19/2014 - 04/22/2014 Hospital Admission He is admitted to the hospital for fever, chills and diarrhea and was subsequently found to have C. difficile infection   05/04/2014 - 05/05/2014 Hospital  Admission The patient was admitted to the hospital again with fever and chills. No source of infection was found and he was not given antibiotics.   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    INTERVAL HISTORY: Please see below for problem oriented charting..  he is seen today for further follow-up. He has recovered from recent infection episode well. He denies further fevers or chills. Denies any crampy abdominal pain or diarrhea. He is weak but overall is improving. He has no change in appetite or nausea.  REVIEW OF SYSTEMS:   Constitutional: Denies fevers, chills or abnormal weight loss Eyes: Denies blurriness of vision Ears, nose, mouth, throat, and face: Denies mucositis or sore throat Respiratory: Denies cough, dyspnea or wheezes Cardiovascular: Denies palpitation, chest discomfort or lower extremity swelling Skin: Denies abnormal skin rashes Lymphatics: Denies new lymphadenopathy or easy bruising Neurological:Denies numbness, tingling or new weaknesses Behavioral/Psych: Mood is stable, no new changes  All other systems were reviewed with the patient and are negative.  I have reviewed the past medical history, past surgical history, social history and family history with the patient and they are unchanged from previous note.  ALLERGIES:  is allergic to velcade and zometa.  MEDICATIONS:  Current Outpatient Prescriptions  Medication Sig Dispense Refill  . acetaminophen (TYLENOL) 325 MG tablet Take 2 tablets (650 mg total) by mouth every 6 (six) hours as needed for mild pain (or Fever >/= 101). 30 tablet 0  . CALCIUM PO Take 1,000 mg by mouth daily.    . Cholecalciferol (VITAMIN D3) 2000 UNITS TABS Take 1 tablet by mouth daily.     .Marland Kitchen  folic acid (FOLVITE) 1 MG tablet Take 1 tablet (1 mg total) by mouth daily. 30 tablet 2  . insulin glargine (LANTUS)  100 UNIT/ML injection Inject 5-6 Units into the skin at bedtime. Take 5 units at Bedtime Everyday Except on Days of Chemo Take 6 units if blood sugar rises    . Magnesium 400 MG TABS Take 400 mg by mouth daily.    . metFORMIN (GLUCOPHAGE) 500 MG tablet Take 500 mg by mouth 2 (two) times daily with a meal. On days of Chemo only  increase dose to 1017m in the evening as needed for rise in blood sugar levels    . Multiple Vitamin (MULTIVITAMIN) tablet Take 1 tablet by mouth daily.    .Marland Kitchenomeprazole (PRILOSEC) 20 MG capsule Take 20 mg by mouth daily.    .Marland KitchenoxyCODONE (OXY IR/ROXICODONE) 5 MG immediate release tablet Take 1 tablet (5 mg total) by mouth every 6 (six) hours as needed for severe pain. 30 tablet 0  . pravastatin (PRAVACHOL) 40 MG tablet Take 40 mg by mouth daily. Daily  1  . saccharomyces boulardii (FLORASTOR) 250 MG capsule Take 1 capsule (250 mg total) by mouth 2 (two) times daily. 60 capsule 2  . vancomycin (VANCOCIN) 125 MG capsule Take 1 capsule (125 mg total) by mouth 4 (four) times daily. 28 capsule 0   No current facility-administered medications for this visit.    PHYSICAL EXAMINATION: ECOG PERFORMANCE STATUS: 0 - Asymptomatic  Filed Vitals:   06/04/14 1144  BP: 129/75  Pulse: 94  Temp: 98.3 F (36.8 C)  Resp: 18   Filed Weights   06/04/14 1144  Weight: 191 lb 3.2 oz (86.728 kg)    GENERAL:alert, no distress and comfortable SKIN: skin color, texture, turgor are normal, no rashes or significant lesions EYES: normal, Conjunctiva are pink and non-injected, sclera clear OROPHARYNX:no exudate, no erythema and lips, buccal mucosa, and tongue normal  NECK: supple, thyroid normal size, non-tender, without nodularity Musculoskeletal:no cyanosis of digits and no clubbing  NEURO: alert & oriented x 3 with fluent speech, no focal motor/sensory deficits  LABORATORY DATA:  I have reviewed the data as listed    Component Value Date/Time   NA 139 06/04/2014 1131   NA 139  05/27/2014 1000   K 3.7 06/04/2014 1131   K 3.3* 05/27/2014 1000   CL 107 05/27/2014 1000   CO2 29 06/04/2014 1131   CO2 27 05/27/2014 1000   GLUCOSE 124 06/04/2014 1131   GLUCOSE 127* 05/27/2014 1000   BUN 17.2 06/04/2014 1131   BUN 12 05/27/2014 1000   CREATININE 1.1 06/04/2014 1131   CREATININE 1.10 05/27/2014 1000   CALCIUM 10.3 06/04/2014 1131   CALCIUM 8.6 05/27/2014 1000   PROT 8.3 06/04/2014 1131   PROT 7.9 05/27/2014 1000   ALBUMIN 3.9 06/04/2014 1131   ALBUMIN 3.7 05/27/2014 1000   AST 24 06/04/2014 1131   AST 37 05/27/2014 1000   ALT 26 06/04/2014 1131   ALT 32 05/27/2014 1000   ALKPHOS 60 06/04/2014 1131   ALKPHOS 47 05/27/2014 1000   BILITOT 0.92 06/04/2014 1131   BILITOT 0.5 05/27/2014 1000   GFRNONAA 70* 05/27/2014 1000   GFRAA 81* 05/27/2014 1000    No results found for: SPEP, UPEP  Lab Results  Component Value Date   WBC 11.8* 06/04/2014   NEUTROABS 6.7* 06/04/2014   HGB 12.1* 06/04/2014   HCT 36.5* 06/04/2014   MCV 91.3 06/04/2014   PLT 324 06/04/2014  Chemistry      Component Value Date/Time   NA 139 06/04/2014 1131   NA 139 05/27/2014 1000   K 3.7 06/04/2014 1131   K 3.3* 05/27/2014 1000   CL 107 05/27/2014 1000   CO2 29 06/04/2014 1131   CO2 27 05/27/2014 1000   BUN 17.2 06/04/2014 1131   BUN 12 05/27/2014 1000   CREATININE 1.1 06/04/2014 1131   CREATININE 1.10 05/27/2014 1000      Component Value Date/Time   CALCIUM 10.3 06/04/2014 1131   CALCIUM 8.6 05/27/2014 1000   ALKPHOS 60 06/04/2014 1131   ALKPHOS 47 05/27/2014 1000   AST 24 06/04/2014 1131   AST 37 05/27/2014 1000   ALT 26 06/04/2014 1131   ALT 32 05/27/2014 1000   BILITOT 0.92 06/04/2014 1131   BILITOT 0.5 05/27/2014 1000     ASSESSMENT & PLAN:  Multiple myeloma  Despite my best effort to try to prevent another hospitalize, he was readmitted to the hospital for yet another complication from treatment. I would discontinue all treatment together and continue  supportive care. Next week, on 06/11/14,  Even he feels well, we will resume history with Revlimid along with 20 mg of dexamethasone weekly. I plan to see him on 06/10/2014 for further assessment. If he tolerates development well, I will recommend placement of port and switch his treatment to Kyprolis. I gave him patient education handout regarding the use of Kyprolis.   Anemia in neoplastic disease This is likely due to recent treatment. The patient denies recent history of bleeding such as epistaxis, hematuria or hematochezia. He is asymptomatic from the anemia. I will observe for now.   Colitis, Clostridium difficile  He has recurrence of C. Difficile infection. He will complete his treatment with vancomycin today. Continue supportive care.   Fever  This is resolved, presumably related to his recent treatment. I will enter both Zometa and Velcade as potential allergies , could be the cause of his fever.    No orders of the defined types were placed in this encounter.   All questions were answered. The patient knows to call the clinic with any problems, questions or concerns. No barriers to learning was detected. I spent 25 minutes counseling the patient face to face. The total time spent in the appointment was 30 minutes and more than 50% was on counseling and review of test results     [facility], [place], Aj Crunkleton, MD 06/04/2014 1:32 PM

## 2014-06-04 NOTE — Assessment & Plan Note (Signed)
Despite my best effort to try to prevent another hospitalize, he was readmitted to the hospital for yet another complication from treatment. I would discontinue all treatment together and continue supportive care. Next week, on 06/11/14,  Even he feels well, we will resume history with Revlimid along with 20 mg of dexamethasone weekly. I plan to see him on 06/10/2014 for further assessment. If he tolerates development well, I will recommend placement of port and switch his treatment to Kyprolis. I gave him patient education handout regarding the use of Kyprolis.

## 2014-06-04 NOTE — Telephone Encounter (Signed)
gv and printed appt sched anda vs for pt for March.... °

## 2014-06-04 NOTE — Assessment & Plan Note (Signed)
This is likely due to recent treatment. The patient denies recent history of bleeding such as epistaxis, hematuria or hematochezia. He is asymptomatic from the anemia. I will observe for now.    

## 2014-06-05 ENCOUNTER — Telehealth: Payer: Self-pay

## 2014-06-05 ENCOUNTER — Telehealth: Payer: Self-pay | Admitting: *Deleted

## 2014-06-05 MED ORDER — LENALIDOMIDE 10 MG PO CAPS: 10.0000 mg | ORAL_CAPSULE | Freq: Every day | ORAL | Status: DC

## 2014-06-05 NOTE — Telephone Encounter (Signed)
Faxed Revlimidm refill to [facility]

## 2014-06-05 NOTE — Telephone Encounter (Signed)
revlimid refill request to Dr Alvy Bimler

## 2014-06-11 ENCOUNTER — Other Ambulatory Visit: Payer: BC Managed Care – PPO

## 2014-06-13 ENCOUNTER — Telehealth: Payer: Self-pay | Admitting: *Deleted

## 2014-06-13 NOTE — Telephone Encounter (Signed)
Rosemarie Ax called reporting "[facility] has tried several times and left messages to reach patient unsuccessfully.  His medication has not been shipped."  Checked phone number and mobile number given to [place].  Has only tried home number.

## 2014-06-14 ENCOUNTER — Encounter: Payer: Self-pay | Admitting: *Deleted

## 2014-06-14 ENCOUNTER — Other Ambulatory Visit (HOSPITAL_BASED_OUTPATIENT_CLINIC_OR_DEPARTMENT_OTHER): Payer: BC Managed Care – PPO

## 2014-06-14 ENCOUNTER — Other Ambulatory Visit: Payer: Self-pay | Admitting: *Deleted

## 2014-06-14 ENCOUNTER — Telehealth: Payer: Self-pay | Admitting: *Deleted

## 2014-06-14 ENCOUNTER — Ambulatory Visit (HOSPITAL_BASED_OUTPATIENT_CLINIC_OR_DEPARTMENT_OTHER): Payer: BC Managed Care – PPO | Admitting: Nurse Practitioner

## 2014-06-14 ENCOUNTER — Ambulatory Visit (HOSPITAL_COMMUNITY)
Admission: RE | Admit: 2014-06-14 | Discharge: 2014-06-14 | Disposition: A | Discharge status: Home / Self Care | Payer: BC Managed Care – PPO | Source: Ambulatory Visit | Attending: Nurse Practitioner | Admitting: Nurse Practitioner

## 2014-06-14 VITALS — BP 126/70 | HR 95 | Temp 98.8°F | Resp 20 | Wt 196.9 lb

## 2014-06-14 DIAGNOSIS — R197 Diarrhea, unspecified: Secondary | ICD-10-CM

## 2014-06-14 DIAGNOSIS — R509 Fever, unspecified: Secondary | ICD-10-CM

## 2014-06-14 DIAGNOSIS — C9000 Multiple myeloma not having achieved remission: Secondary | ICD-10-CM

## 2014-06-14 DIAGNOSIS — E860 Dehydration: Secondary | ICD-10-CM

## 2014-06-14 LAB — CBC WITH DIFFERENTIAL/PLATELET
BASO%: 0.3 % (ref 0.0–2.0)
Basophils Absolute: 0 10*3/uL (ref 0.0–0.1)
EOS%: 4.1 % (ref 0.0–7.0)
Eosinophils Absolute: 0.3 10*3/uL (ref 0.0–0.5)
HCT: 37 % — ABNORMAL LOW (ref 38.4–49.9)
HEMOGLOBIN: 12.1 g/dL — AB (ref 13.0–17.1)
LYMPH%: 8.2 % — ABNORMAL LOW (ref 14.0–49.0)
MCH: 29.9 pg (ref 27.2–33.4)
MCHC: 32.8 g/dL (ref 32.0–36.0)
MCV: 91.1 fL (ref 79.3–98.0)
MONO#: 0.7 10*3/uL (ref 0.1–0.9)
MONO%: 10.1 % (ref 0.0–14.0)
NEUT%: 77.3 % — ABNORMAL HIGH (ref 39.0–75.0)
NEUTROS ABS: 5.4 10*3/uL (ref 1.5–6.5)
Platelets: 216 10*3/uL (ref 140–400)
RBC: 4.06 10*6/uL — ABNORMAL LOW (ref 4.20–5.82)
RDW: 15.7 % — ABNORMAL HIGH (ref 11.0–14.6)
WBC: 7 10*3/uL (ref 4.0–10.3)
lymph#: 0.6 10*3/uL — ABNORMAL LOW (ref 0.9–3.3)

## 2014-06-14 LAB — COMPREHENSIVE METABOLIC PANEL (CC13)
ALT: 46 U/L (ref 0–55)
ANION GAP: 11 meq/L (ref 3–11)
AST: 34 U/L (ref 5–34)
Albumin: 4.1 g/dL (ref 3.5–5.0)
Alkaline Phosphatase: 64 U/L (ref 40–150)
BUN: 16.6 mg/dL (ref 7.0–26.0)
CO2: 26 mEq/L (ref 22–29)
CREATININE: 1.1 mg/dL (ref 0.7–1.3)
Calcium: 9.9 mg/dL (ref 8.4–10.4)
Chloride: 100 mEq/L (ref 98–109)
EGFR: 73 mL/min/{1.73_m2} — ABNORMAL LOW (ref >90)
Glucose: 99 mg/dl (ref 70–140)
Potassium: 3.7 mEq/L (ref 3.5–5.1)
Sodium: 137 mEq/L (ref 136–145)
Total Bilirubin: 0.85 mg/dL (ref 0.20–1.20)
Total Protein: 7.8 g/dL (ref 6.4–8.3)

## 2014-06-14 LAB — URINALYSIS, MICROSCOPIC - CHCC
BILIRUBIN (URINE): NEGATIVE
Blood: NEGATIVE
GLUCOSE UR CHCC: NEGATIVE mg/dL
Ketones: NEGATIVE mg/dL
Leukocyte Esterase: NEGATIVE
NITRITE: NEGATIVE
PROTEIN: NEGATIVE mg/dL
RBC / HPF: NEGATIVE (ref 0–2)
Specific Gravity, Urine: 1.005 (ref 1.003–1.035)
UROBILINOGEN UR: 0.2 mg/dL (ref 0.2–1)
pH: 7.5 (ref 4.6–8.0)

## 2014-06-14 LAB — TECHNOLOGIST REVIEW

## 2014-06-14 MED ORDER — LEVOFLOXACIN 500 MG PO TABS: 500.0000 mg | ORAL_TABLET | Freq: Every day | ORAL | Status: DC

## 2014-06-14 NOTE — Telephone Encounter (Addendum)
PT. "FEELS WARM". HE HAS A SLIGHT HEADACHE. PT. HAS NOT HAD CHILLS,COUGH, OR CONGESTION. HE HAS NO URINARY SYMPTOMS OR DIARRHEA. THIS NOTE ROUTED TO [place]. VERBAL ORDER AND READ BACK TO [place]- MONITOR TEMPERATURE IF UNDER 101. IF FEVER IS 101 OR GREATER CALL EARLY TOMORROW MORNING TO SEE CYNDEE BACON,NP. CALLED TO NOTIFIED PT. HE STATES HE HAVING CHILLS AND TEMPERATURE IS 99.5. VERBAL ORDER AND READ BACK TO [place]- PT. TO SEE CYNDEE BACON,NP. NOTIFIED PT. THIS NOTE WAS ROUTED TO CYNDEE BACON,NP AND MYRTLE HARDIN,RN.

## 2014-06-15 ENCOUNTER — Encounter: Payer: Self-pay | Admitting: Nurse Practitioner

## 2014-06-15 ENCOUNTER — Other Ambulatory Visit (HOSPITAL_COMMUNITY)
Admission: RE | Admit: 2014-06-15 | Discharge: 2014-06-15 | Disposition: A | Discharge status: Home / Self Care | Payer: BC Managed Care – PPO | Source: Ambulatory Visit | Attending: Hematology and Oncology | Admitting: Hematology and Oncology

## 2014-06-15 ENCOUNTER — Encounter: Payer: Self-pay | Admitting: *Deleted

## 2014-06-15 ENCOUNTER — Ambulatory Visit (HOSPITAL_BASED_OUTPATIENT_CLINIC_OR_DEPARTMENT_OTHER): Payer: BC Managed Care – PPO | Admitting: Nurse Practitioner

## 2014-06-15 ENCOUNTER — Other Ambulatory Visit: Payer: Self-pay | Admitting: *Deleted

## 2014-06-15 ENCOUNTER — Ambulatory Visit (HOSPITAL_BASED_OUTPATIENT_CLINIC_OR_DEPARTMENT_OTHER): Payer: BC Managed Care – PPO

## 2014-06-15 VITALS — BP 122/66 | HR 81 | Temp 98.8°F | Resp 16

## 2014-06-15 DIAGNOSIS — A047 Enterocolitis due to Clostridium difficile: Secondary | ICD-10-CM | POA: Diagnosis not present

## 2014-06-15 DIAGNOSIS — A0472 Enterocolitis due to Clostridium difficile, not specified as recurrent: Secondary | ICD-10-CM

## 2014-06-15 DIAGNOSIS — C9000 Multiple myeloma not having achieved remission: Secondary | ICD-10-CM

## 2014-06-15 DIAGNOSIS — E860 Dehydration: Secondary | ICD-10-CM

## 2014-06-15 DIAGNOSIS — R509 Fever, unspecified: Secondary | ICD-10-CM

## 2014-06-15 LAB — URINE CULTURE

## 2014-06-15 LAB — CLOSTRIDIUM DIFFICILE BY PCR: CDIFFPCR: POSITIVE — AB

## 2014-06-15 MED ORDER — METRONIDAZOLE 500 MG PO TABS
500.0000 mg | ORAL_TABLET | Freq: Three times a day (TID) | ORAL | Status: DC
Start: 2014-06-15 — End: 2014-07-02

## 2014-06-15 MED ORDER — SODIUM CHLORIDE 0.9 % IV SOLN
Freq: Once | INTRAVENOUS | Status: AC
  Administered 2014-06-15: 10:00:00 via INTRAVENOUS

## 2014-06-15 NOTE — Patient Instructions (Signed)
Dehydration, Adult Dehydration is when you lose more fluids from the body than you take in. Vital organs like the kidneys, brain, and heart cannot function without a proper amount of fluids and salt. Any loss of fluids from the body can cause dehydration.  CAUSES   Vomiting.  Diarrhea.  Excessive sweating.  Excessive urine output.  Fever. SYMPTOMS  Mild dehydration  Thirst.  Dry lips.  Slightly dry mouth. Moderate dehydration  Very dry mouth.  Sunken eyes.  Skin does not bounce back quickly when lightly pinched and released.  Dark urine and decreased urine production.  Decreased tear production.  Headache. Severe dehydration  Very dry mouth.  Extreme thirst.  Rapid, weak pulse (more than 100 beats per minute at rest).  Cold hands and feet.  Not able to sweat in spite of heat and temperature.  Rapid breathing.  Blue lips.  Confusion and lethargy.  Difficulty being awakened.  Minimal urine production.  No tears. DIAGNOSIS  Your caregiver will diagnose dehydration based on your symptoms and your exam. Blood and urine tests will help confirm the diagnosis. The diagnostic evaluation should also identify the cause of dehydration. TREATMENT  Treatment of mild or moderate dehydration can often be done at home by increasing the amount of fluids that you drink. It is best to drink small amounts of fluid more often. Drinking too much at one time can make vomiting worse. Refer to the home care instructions below. Severe dehydration needs to be treated at the hospital where you will probably be given intravenous (IV) fluids that contain water and electrolytes. HOME CARE INSTRUCTIONS   Ask your caregiver about specific rehydration instructions.  Drink enough fluids to keep your urine clear or pale yellow.  Drink small amounts frequently if you have nausea and vomiting.  Eat as you normally do.  Avoid:  Foods or drinks high in sugar.  Carbonated  drinks.  Juice.  Extremely hot or cold fluids.  Drinks with caffeine.  Fatty, greasy foods.  Alcohol.  Tobacco.  Overeating.  Gelatin desserts.  Wash your hands well to avoid spreading bacteria and viruses.  Only take over-the-counter or prescription medicines for pain, discomfort, or fever as directed by your caregiver.  Ask your caregiver if you should continue all prescribed and over-the-counter medicines.  Keep all follow-up appointments with your caregiver. SEEK MEDICAL CARE IF:  You have abdominal pain and it increases or stays in one area (localizes).  You have a rash, stiff neck, or severe headache.  You are irritable, sleepy, or difficult to awaken.  You are weak, dizzy, or extremely thirsty. SEEK IMMEDIATE MEDICAL CARE IF:   You are unable to keep fluids down or you get worse despite treatment.  You have frequent episodes of vomiting or diarrhea.  You have blood or green matter (bile) in your vomit.  You have blood in your stool or your stool looks black and tarry.  You have not urinated in 6 to 8 hours, or you have only urinated a small amount of very dark urine.  You have a fever.  You faint. MAKE SURE YOU:   Understand these instructions.  Will watch your condition.  Will get help right away if you are not doing well or get worse. Document Released: 03/09/2005 Document Revised: 06/01/2011 Document Reviewed: 10/27/2010 Exit[employer] Patient Information 2015 [employer]. This information is not intended to replace advice given to you by your health care provider. Make sure you discuss any questions you have with your health care   provider.  

## 2014-06-15 NOTE — Assessment & Plan Note (Signed)
Patient with a one-week history of diarrhea.  Patient is taking intermittent Imodium only.  Patient states that he is now only having bowel movements once to twice per day; and that the stool is now essentially formed.  He also complains of some mild abdominal cramping associated with the bowel movements.  He denies any blood in stool.  He is also complaining of a fever to maximum 100.3 last night.  Temperature while in the cancer Center was 98.8.  Patient's wife states that patient typically presents with a non--watery stool and fever when diagnosed twice in the past for C. Difficile. C. Difficile and culture were obtained this evening.  Advised both patient and his wife that will most likely be unable to obtain c-diff results this evening.  Is to return for follow up, IV fluids, and to review c diff results.

## 2014-06-15 NOTE — Assessment & Plan Note (Signed)
Pt was afebrile today while at the cancer center. Pt advised to hold any further levaquin abx; since c-diff was tested as positive. Pt will initiate flagyl.

## 2014-06-15 NOTE — Assessment & Plan Note (Signed)
Patient with complaint of diarrhea for the past week.  He does feel dehydrated today; but prefers to wait until return tomorrow for IV fluid rehydration.  In the meantime, patient was advised to push fluids is much as possible.

## 2014-06-15 NOTE — Assessment & Plan Note (Signed)
Pt continues to feel slightly dehydrated; and will receive 1 liter NS IV fluids today.  He was also advised to push fluids as much as possible.

## 2014-06-15 NOTE — Assessment & Plan Note (Signed)
Patient was advised to hold all further Revlimid for the time being.   Pt will return in morning for follow up of c-diff results, IV fluid rehydration.    He is also scheduled for labs and follow up with Dr. Alvy Bimler on 06/18/14.

## 2014-06-15 NOTE — Assessment & Plan Note (Signed)
Pt will continue to hold Revlimid for time being- until he is recovered from c-diff.

## 2014-06-15 NOTE — Assessment & Plan Note (Addendum)
Patient with fever to 100.3 last night.  Patient is complaining of chills.  Patient reports [place] one-week history of diarrhea is now improved to only one formed stool per day on average.  Patient continues to complain of some cramping abdominal discomfort.  Temperature while cancer Center was 98.8.  Patient has history C. Difficile in the past.  Stool for C. Difficile obtained; but will not results for several hours.  Urinalysis was negative for infection; pending urine culture.  Also obtained blood cultures x2. Chest xray obtained today was negative for acute findings.   Given patient's fever to maximum 100.3 within the past 24 hours-will prescribe Levaquin for the patient to start this evening.  Patient was advised to go to the emergency department any new worries or concerns.

## 2014-06-15 NOTE — Assessment & Plan Note (Signed)
C-diff stool was positive for c-diff.  Pt advised to hold any further levaquin abx; and to start Flagyl for tx of c-diff.

## 2014-06-15 NOTE — Progress Notes (Signed)
SYMPTOM MANAGEMENT CLINIC   HPI: Tyler Willis 64 y.o. male diagnosed with multiple myeloma.  Currently undergoing Revlimid oral therapy.    Patient with a one-week history of diarrhea. Patient's wife states that patient typically presents with a non--watery stool and fever when diagnosed twice in the past for C. Difficile. However, pt states that he is now only having 1 formed stool per day.  Continues with some abd cramping.   C-diff for stool tested positive with stool sample from last nite.   Patient denies any nausea or vomiting.  No fever for last 24 hours.   Pt has been holding Revlimid since yesterday as directed.    Fever     Review of Systems  Constitutional: Positive for fever.    Past Medical History  Diagnosis Date  . Diabetes mellitus without complication   . GERD (gastroesophageal reflux disease)   . 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 Multiple myeloma 02/19/2014    Past Surgical History  Procedure Laterality Date  . Testicle removal Right 2004  . Sternum biopsy      for dx of multiple myeloma    has Diverticulosis; Lytic bone lesions on xray; Multiple myeloma; Amyloidosis; Coronary artery calcification seen on CAT scan; Diabetes mellitus without complication; Seminoma of right testis; Lesion of mandible; Bleeding hemorrhoid; Insomnia; HLD (hyperlipidemia); GERD (gastroesophageal reflux disease); Fever; Hypotension; Blood poisoning; HCAP (healthcare-associated pneumonia); Anemia of chronic disease; Sepsis; Colitis, Clostridium difficile; SIRS (systemic inflammatory response syndrome); Pyrexia; Diarrhea; Abdominal tenderness in right flank; Hypogammaglobulinemia; Anemia in neoplastic disease; Weakness generalized; Pneumonia; and Dehydration on his problem list.    is allergic to velcade and zometa.    Medication List       This list is accurate as of: 06/15/14  9:56 PM.  Always use your most recent med list.               acetaminophen 325 MG tablet    Commonly known as:  TYLENOL  Take 2 tablets (650 mg total) by mouth every 6 (six) hours as needed for mild pain (or Fever >/= 101).     CALCIUM PO  Take 1,000 mg by mouth daily.     folic acid 1 MG tablet  Commonly known as:  FOLVITE  Take 1 tablet (1 mg total) by mouth daily.     insulin glargine 100 UNIT/ML injection  Commonly known as:  LANTUS  Inject 5-6 Units into the skin at bedtime. Take 5 units at Bedtime Everyday Except on Days of Chemo Take 6 units if blood sugar rises     lenalidomide 10 MG capsule  Commonly known as:  REVLIMID  Take 1 capsule (10 mg total) by mouth daily.     levofloxacin 500 MG tablet  Commonly known as:  LEVAQUIN  Take 1 tablet (500 mg total) by mouth daily.     Magnesium 400 MG Tabs  Take 400 mg by mouth daily.     metFORMIN 500 MG tablet  Commonly known as:  GLUCOPHAGE  Take 500 mg by mouth 2 (two) times daily with a meal. On days of Chemo only  increase dose to 1074m in the evening as needed for rise in blood sugar levels     metroNIDAZOLE 500 MG tablet  Commonly known as:  FLAGYL  Take 1 tablet (500 mg total) by mouth 3 (three) times daily.     multivitamin tablet  Take 1 tablet by mouth daily.     omeprazole 20  MG capsule  Commonly known as:  PRILOSEC  Take 20 mg by mouth daily.     oxyCODONE 5 MG immediate release tablet  Commonly known as:  Oxy IR/ROXICODONE  Take 1 tablet (5 mg total) by mouth every 6 (six) hours as needed for severe pain.     pravastatin 40 MG tablet  Commonly known as:  PRAVACHOL  Take 40 mg by mouth daily. Daily     saccharomyces boulardii 250 MG capsule  Commonly known as:  FLORASTOR  Take 1 capsule (250 mg total) by mouth 2 (two) times daily.     Vitamin D3 2000 UNITS Tabs  Take 1 tablet by mouth daily.         PHYSICAL EXAMINATION  Oncology Vitals 06/15/2014 06/15/2014 06/14/2014 06/04/2014 05/28/2014 05/27/2014 05/27/2014  Height - - - 183 cm - - -  Weight - - 89.313 kg 86.728 kg - - -  Weight (lbs)  - - 196 lbs 14 oz 191 lbs 3 oz - - -  BMI (kg/m2) - - - 25.93 kg/m2 - - -  Temp 98 98.8 98.8 98.3 98 98 97.6  Pulse 69 81 95 94 65 65 68  Resp 18 16 20 18 16 18 18   SpO2 - - 98 - 96 100 98  BSA (m2) - - - 2.1 m2 - - -   BP Readings from Last 3 Encounters:  06/15/14 124/63  06/15/14 122/66  06/14/14 126/70    Physical Exam  Constitutional: He is oriented to person, place, and time. Vital signs are normal. He appears dehydrated. He appears unhealthy.  HENT:  Head: Normocephalic and atraumatic.  Mouth/Throat: Oropharynx is clear and moist.  Eyes: Conjunctivae and EOM are normal. Pupils are equal, round, and reactive to light. Right eye exhibits no discharge. Left eye exhibits no discharge. No scleral icterus.  Neck: Normal range of motion. Neck supple. No JVD present. No tracheal deviation present. No thyromegaly present.  Cardiovascular: Normal rate, regular rhythm, normal heart sounds and intact distal pulses.   Pulmonary/Chest: Effort normal and breath sounds normal. No respiratory distress. He has no wheezes. He has no rales. He exhibits no tenderness.  Abdominal: Soft. Bowel sounds are normal. He exhibits no distension and no mass. There is no tenderness. There is no rebound and no guarding.  Musculoskeletal: Normal range of motion. He exhibits no edema or tenderness.  Lymphadenopathy:    He has no cervical adenopathy.  Neurological: He is alert and oriented to person, place, and time. Gait normal.  Skin: Skin is warm and dry. No rash noted. No erythema. No pallor.  Psychiatric: Affect normal.  Nursing note and vitals reviewed.   LABORATORY DATA:. Hospital Outpatient Visit on 06/15/2014  Component Date Value Ref Range Status  . C difficile by pcr 06/15/2014 POSITIVE* NEGATIVE Final   Comment: CRITICAL RESULT CALLED TO, READ BACK BY AND VERIFIED WITH: KERR,M. RN @1420  ON 3.25.16 BY HOOKER,B.   Appointment on 06/14/2014  Component Date Value Ref Range Status  . WBC  06/14/2014 7.0  4.0 - 10.3 10e3/uL Final  . NEUT# 06/14/2014 5.4  1.5 - 6.5 10e3/uL Final  . HGB 06/14/2014 12.1* 13.0 - 17.1 g/dL Final  . HCT 06/14/2014 37.0* 38.4 - 49.9 % Final  . Platelets 06/14/2014 216  140 - 400 10e3/uL Final  . MCV 06/14/2014 91.1  79.3 - 98.0 fL Final  . MCH 06/14/2014 29.9  27.2 - 33.4 pg Final  . MCHC 06/14/2014 32.8  32.0 - 36.0 g/dL  Final  . RBC 06/14/2014 4.06* 4.20 - 5.82 10e6/uL Final  . RDW 06/14/2014 15.7* 11.0 - 14.6 % Final  . lymph# 06/14/2014 0.6* 0.9 - 3.3 10e3/uL Final  . MONO# 06/14/2014 0.7  0.1 - 0.9 10e3/uL Final  . Eosinophils Absolute 06/14/2014 0.3  0.0 - 0.5 10e3/uL Final  . Basophils Absolute 06/14/2014 0.0  0.0 - 0.1 10e3/uL Final  . NEUT% 06/14/2014 77.3* 39.0 - 75.0 % Final  . LYMPH% 06/14/2014 8.2* 14.0 - 49.0 % Final  . MONO% 06/14/2014 10.1  0.0 - 14.0 % Final  . EOS% 06/14/2014 4.1  0.0 - 7.0 % Final  . BASO% 06/14/2014 0.3  0.0 - 2.0 % Final  . Sodium 06/14/2014 137  136 - 145 mEq/L Final  . Potassium 06/14/2014 3.7  3.5 - 5.1 mEq/L Final  . Chloride 06/14/2014 100  98 - 109 mEq/L Final  . CO2 06/14/2014 26  22 - 29 mEq/L Final  . Glucose 06/14/2014 99  70 - 140 mg/dl Final  . BUN 06/14/2014 16.6  7.0 - 26.0 mg/dL Final  . Creatinine 06/14/2014 1.1  0.7 - 1.3 mg/dL Final  . Total Bilirubin 06/14/2014 0.85  0.20 - 1.20 mg/dL Final  . Alkaline Phosphatase 06/14/2014 64  40 - 150 U/L Final  . AST 06/14/2014 34  5 - 34 U/L Final  . ALT 06/14/2014 46  0 - 55 U/L Final  . Total Protein 06/14/2014 7.8  6.4 - 8.3 g/dL Final  . Albumin 06/14/2014 4.1  3.5 - 5.0 g/dL Final  . Calcium 06/14/2014 9.9  8.4 - 10.4 mg/dL Final  . Anion Gap 06/14/2014 11  3 - 11 mEq/L Final  . EGFR 06/14/2014 73* >90 ml/min/1.73 m2 Final   eGFR is calculated using the CKD-EPI Creatinine Equation (2009)  . Glucose 06/14/2014 Negative  Negative mg/dL Final  . Bilirubin (Urine) 06/14/2014 Negative  Negative Final  . Ketones 06/14/2014 Negative  Negative  mg/dL Final  . Specific Gravity, Urine 06/14/2014 1.005  1.003 - 1.035 Final  . Blood 06/14/2014 Negative  Negative Final  . pH 06/14/2014 7.5  4.6 - 8.0 Final  . Protein 06/14/2014 Negative  Negative- <30 mg/dL Final  . Urobilinogen, UR 06/14/2014 0.2  0.2 - 1 mg/dL Final  . Nitrite 06/14/2014 Negative  Negative Final  . Leukocyte Esterase 06/14/2014 Negative  Negative Final  . RBC / HPF 06/14/2014 Negative  0 - 2 Final  . WBC, UA 06/14/2014 0-2  0 - 2 Final  . Urine Culture, Routine 06/14/2014 Culture, Urine   Final   Comment: Final - ===== COLONY COUNT: ===== NO GROWTH NO GROWTH   . Technologist Review 06/14/2014 Metas and Myelocytes present   Final     RADIOGRAPHIC STUDIES: Dg Chest 2 View  06/14/2014   CLINICAL DATA:  History of multiple myeloma. Chills and fever today.  EXAM: CHEST  2 VIEW  COMPARISON:  05/25/2014; 05/04/2014; 04/19/2014; chest CT- 02/02/2014  FINDINGS: Grossly unchanged cardiac silhouette and mediastinal contours. There is mild perihilar predominant interstitial thickening. Unchanged left basilar linear heterogeneous opacities favored to represent atelectasis or scar. No new focal airspace opacities. No pleural effusion or pneumothorax. Unchanged bones including asymmetric increased sclerosis involving the anterior aspect of the left fifth rib.  IMPRESSION: Findings suggestive of mild centralized airways disease / bronchitis. No new discrete focal airspace opacities to suggest pneumonia.   Electronically Signed   By: Sandi Mariscal M.D.   On: 06/14/2014 18:20    ASSESSMENT/PLAN:  Colitis, Clostridium difficile C-diff stool was positive for c-diff.  Pt advised to hold any further levaquin abx; and to start Flagyl for tx of c-diff.      Dehydration Pt continues to feel slightly dehydrated; and will receive 1 liter NS IV fluids today.  He was also advised to push fluids as much as possible.    Fever Pt was afebrile today while at the cancer center. Pt advised to  hold any further levaquin abx; since c-diff was tested as positive. Pt will initiate flagyl.    Multiple myeloma Pt will continue to hold Revlimid for time being- until he is recovered from c-diff.     Patient stated understanding of all instructions; and was in agreement with this plan of care. The patient knows to call the clinic with any problems, questions or concerns.   Review/collaboration with Dr. Alvy Bimler regarding all aspects of patient's visit today.   Total time spent with patient was 40 minutes;  with greater than 75 percent of that time spent in face to face counseling regarding patient's symptoms,  and coordination of care and follow up.  Disclaimer: This note was dictated with voice recognition software. Similar sounding words can inadvertently be transcribed and may not be corrected upon review.   Drue Second, NP 06/15/2014

## 2014-06-15 NOTE — Progress Notes (Unsigned)
Gave Positive Clostridium Difficile result to Tyler Willis 06/15/14 1426

## 2014-06-15 NOTE — Progress Notes (Signed)
SYMPTOM MANAGEMENT CLINIC   HPI: Tyler Willis 64 y.o. male diagnosed with multiple myeloma.  Currently undergoing Revlimid oral therapy.    Patient with a one-week history of diarrhea.  Patient is taking intermittent Imodium only.  Patient states that he is now only having bowel movements once to twice per day; and that the stool is now essentially formed.  He also complains of some mild abdominal cramping associated with the bowel movements.  He denies any blood in stool.  He is also complaining of a fever to maximum 100.3 last night.  Temperature while in the cancer Center was 98.8.  Patient's wife states that patient typically presents with a non--watery stool and fever when diagnosed twice in the past for C. Difficile.  Patient denies any nausea or vomiting.   HPI  ROS  Past Medical History  Diagnosis Date  . Diabetes mellitus without complication   . GERD (gastroesophageal reflux disease)   . 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 Multiple myeloma 02/19/2014    Past Surgical History  Procedure Laterality Date  . Testicle removal Right 2004  . Sternum biopsy      for dx of multiple myeloma    has Diverticulosis; Lytic bone lesions on xray; Multiple myeloma; Amyloidosis; Coronary artery calcification seen on CAT scan; Diabetes mellitus without complication; Seminoma of right testis; Lesion of mandible; Bleeding hemorrhoid; Insomnia; HLD (hyperlipidemia); GERD (gastroesophageal reflux disease); Fever; Hypotension; Blood poisoning; HCAP (healthcare-associated pneumonia); Anemia of chronic disease; Sepsis; Colitis, Clostridium difficile; SIRS (systemic inflammatory response syndrome); Pyrexia; Diarrhea; Abdominal tenderness in right flank; Hypogammaglobulinemia; Anemia in neoplastic disease; Weakness generalized; Pneumonia; and Dehydration on his problem list.    is allergic to velcade and zometa.    Medication List       This list is accurate as of: 06/14/14 11:59 PM.  Always use  your most recent med list.               acetaminophen 325 MG tablet  Commonly known as:  TYLENOL  Take 2 tablets (650 mg total) by mouth every 6 (six) hours as needed for mild pain (or Fever >/= 101).     CALCIUM PO  Take 1,000 mg by mouth daily.     folic acid 1 MG tablet  Commonly known as:  FOLVITE  Take 1 tablet (1 mg total) by mouth daily.     insulin glargine 100 UNIT/ML injection  Commonly known as:  LANTUS  Inject 5-6 Units into the skin at bedtime. Take 5 units at Bedtime Everyday Except on Days of Chemo Take 6 units if blood sugar rises     lenalidomide 10 MG capsule  Commonly known as:  REVLIMID  Take 1 capsule (10 mg total) by mouth daily.     levofloxacin 500 MG tablet  Commonly known as:  LEVAQUIN  Take 1 tablet (500 mg total) by mouth daily.     Magnesium 400 MG Tabs  Take 400 mg by mouth daily.     metFORMIN 500 MG tablet  Commonly known as:  GLUCOPHAGE  Take 500 mg by mouth 2 (two) times daily with a meal. On days of Chemo only  increase dose to 1094m in the evening as needed for rise in blood sugar levels     metroNIDAZOLE 500 MG tablet  Commonly known as:  FLAGYL  Take 1 tablet (500 mg total) by mouth 3 (three) times daily.     multivitamin tablet  Take 1 tablet by  mouth daily.     omeprazole 20 MG capsule  Commonly known as:  PRILOSEC  Take 20 mg by mouth daily.     oxyCODONE 5 MG immediate release tablet  Commonly known as:  Oxy IR/ROXICODONE  Take 1 tablet (5 mg total) by mouth every 6 (six) hours as needed for severe pain.     pravastatin 40 MG tablet  Commonly known as:  PRAVACHOL  Take 40 mg by mouth daily. Daily     saccharomyces boulardii 250 MG capsule  Commonly known as:  FLORASTOR  Take 1 capsule (250 mg total) by mouth 2 (two) times daily.     Vitamin D3 2000 UNITS Tabs  Take 1 tablet by mouth daily.         PHYSICAL EXAMINATION  Oncology Vitals 06/15/2014 06/15/2014 06/14/2014 06/04/2014 05/28/2014 05/27/2014 05/27/2014    Height - - - 183 cm - - -  Weight - - 89.313 kg 86.728 kg - - -  Weight (lbs) - - 196 lbs 14 oz 191 lbs 3 oz - - -  BMI (kg/m2) - - - 25.93 kg/m2 - - -  Temp 98 98.8 98.8 98.3 98 98 97.6  Pulse 69 81 95 94 65 65 68  Resp 18 16 20 18 16 18 18   SpO2 - - 98 - 96 100 98  BSA (m2) - - - 2.1 m2 - - -   BP Readings from Last 3 Encounters:  06/15/14 124/63  06/15/14 122/66  06/14/14 126/70    Physical Exam  Constitutional: He is oriented to person, place, and time. Vital signs are normal. He appears dehydrated. He appears unhealthy.  HENT:  Head: Normocephalic and atraumatic.  Mouth/Throat: Oropharynx is clear and moist.  Eyes: Conjunctivae and EOM are normal. Pupils are equal, round, and reactive to light. Right eye exhibits no discharge. Left eye exhibits no discharge. No scleral icterus.  Neck: Normal range of motion. Neck supple. No JVD present. No tracheal deviation present. No thyromegaly present.  Cardiovascular: Normal rate, regular rhythm, normal heart sounds and intact distal pulses.   Pulmonary/Chest: Effort normal and breath sounds normal. No respiratory distress. He has no wheezes. He has no rales. He exhibits no tenderness.  Abdominal: Soft. Bowel sounds are normal. He exhibits no distension and no mass. There is no tenderness. There is no rebound and no guarding.  Musculoskeletal: Normal range of motion. He exhibits no edema or tenderness.  Lymphadenopathy:    He has no cervical adenopathy.  Neurological: He is alert and oriented to person, place, and time. Gait normal.  Skin: Skin is warm and dry. No rash noted. No erythema. No pallor.  Psychiatric: Affect normal.  Nursing note and vitals reviewed.   LABORATORY DATA:. Appointment on 06/14/2014  Component Date Value Ref Range Status  . WBC 06/14/2014 7.0  4.0 - 10.3 10e3/uL Final  . NEUT# 06/14/2014 5.4  1.5 - 6.5 10e3/uL Final  . HGB 06/14/2014 12.1* 13.0 - 17.1 g/dL Final  . HCT 06/14/2014 37.0* 38.4 - 49.9 % Final   . Platelets 06/14/2014 216  140 - 400 10e3/uL Final  . MCV 06/14/2014 91.1  79.3 - 98.0 fL Final  . MCH 06/14/2014 29.9  27.2 - 33.4 pg Final  . MCHC 06/14/2014 32.8  32.0 - 36.0 g/dL Final  . RBC 06/14/2014 4.06* 4.20 - 5.82 10e6/uL Final  . RDW 06/14/2014 15.7* 11.0 - 14.6 % Final  . lymph# 06/14/2014 0.6* 0.9 - 3.3 10e3/uL Final  . MONO# 06/14/2014  0.7  0.1 - 0.9 10e3/uL Final  . Eosinophils Absolute 06/14/2014 0.3  0.0 - 0.5 10e3/uL Final  . Basophils Absolute 06/14/2014 0.0  0.0 - 0.1 10e3/uL Final  . NEUT% 06/14/2014 77.3* 39.0 - 75.0 % Final  . LYMPH% 06/14/2014 8.2* 14.0 - 49.0 % Final  . MONO% 06/14/2014 10.1  0.0 - 14.0 % Final  . EOS% 06/14/2014 4.1  0.0 - 7.0 % Final  . BASO% 06/14/2014 0.3  0.0 - 2.0 % Final  . Sodium 06/14/2014 137  136 - 145 mEq/L Final  . Potassium 06/14/2014 3.7  3.5 - 5.1 mEq/L Final  . Chloride 06/14/2014 100  98 - 109 mEq/L Final  . CO2 06/14/2014 26  22 - 29 mEq/L Final  . Glucose 06/14/2014 99  70 - 140 mg/dl Final  . BUN 06/14/2014 16.6  7.0 - 26.0 mg/dL Final  . Creatinine 06/14/2014 1.1  0.7 - 1.3 mg/dL Final  . Total Bilirubin 06/14/2014 0.85  0.20 - 1.20 mg/dL Final  . Alkaline Phosphatase 06/14/2014 64  40 - 150 U/L Final  . AST 06/14/2014 34  5 - 34 U/L Final  . ALT 06/14/2014 46  0 - 55 U/L Final  . Total Protein 06/14/2014 7.8  6.4 - 8.3 g/dL Final  . Albumin 06/14/2014 4.1  3.5 - 5.0 g/dL Final  . Calcium 06/14/2014 9.9  8.4 - 10.4 mg/dL Final  . Anion Gap 06/14/2014 11  3 - 11 mEq/L Final  . EGFR 06/14/2014 73* >90 ml/min/1.73 m2 Final   eGFR is calculated using the CKD-EPI Creatinine Equation (2009)  . Glucose 06/14/2014 Negative  Negative mg/dL Final  . Bilirubin (Urine) 06/14/2014 Negative  Negative Final  . Ketones 06/14/2014 Negative  Negative mg/dL Final  . Specific Gravity, Urine 06/14/2014 1.005  1.003 - 1.035 Final  . Blood 06/14/2014 Negative  Negative Final  . pH 06/14/2014 7.5  4.6 - 8.0 Final  . Protein 06/14/2014  Negative  Negative- <30 mg/dL Final  . Urobilinogen, UR 06/14/2014 0.2  0.2 - 1 mg/dL Final  . Nitrite 06/14/2014 Negative  Negative Final  . Leukocyte Esterase 06/14/2014 Negative  Negative Final  . RBC / HPF 06/14/2014 Negative  0 - 2 Final  . WBC, UA 06/14/2014 0-2  0 - 2 Final  . Urine Culture, Routine 06/14/2014 Culture, Urine   Final   Comment: Final - ===== COLONY COUNT: ===== NO GROWTH NO GROWTH   . Technologist Review 06/14/2014 Metas and Myelocytes present   Final     RADIOGRAPHIC STUDIES: Dg Chest 2 View  06/14/2014   CLINICAL DATA:  History of multiple myeloma. Chills and fever today.  EXAM: CHEST  2 VIEW  COMPARISON:  05/25/2014; 05/04/2014; 04/19/2014; chest CT- 02/02/2014  FINDINGS: Grossly unchanged cardiac silhouette and mediastinal contours. There is mild perihilar predominant interstitial thickening. Unchanged left basilar linear heterogeneous opacities favored to represent atelectasis or scar. No new focal airspace opacities. No pleural effusion or pneumothorax. Unchanged bones including asymmetric increased sclerosis involving the anterior aspect of the left fifth rib.  IMPRESSION: Findings suggestive of mild centralized airways disease / bronchitis. No new discrete focal airspace opacities to suggest pneumonia.   Electronically Signed   By: Sandi Mariscal M.D.   On: 06/14/2014 18:20    ASSESSMENT/PLAN:    Dehydration Patient with complaint of diarrhea for the past week.  He does feel dehydrated today; but prefers to wait until return tomorrow for IV fluid rehydration.  In the meantime, patient was advised  to push fluids is much as possible.   Diarrhea Patient with a one-week history of diarrhea.  Patient is taking intermittent Imodium only.  Patient states that he is now only having bowel movements once to twice per day; and that the stool is now essentially formed.  He also complains of some mild abdominal cramping associated with the bowel movements.  He denies any blood  in stool.  He is also complaining of a fever to maximum 100.3 last night.  Temperature while in the cancer Center was 98.8.  Patient's wife states that patient typically presents with a non--watery stool and fever when diagnosed twice in the past for C. Difficile. C. Difficile and culture were obtained this evening.  Advised both patient and his wife that will most likely be unable to obtain c-diff results this evening.  Is to return for follow up, IV fluids, and to review c diff results.    Fever Patient with fever to 100.3 last night.  Patient is complaining of chills.  Patient reports [place] one-week history of diarrhea is now improved to only one formed stool per day on average.  Patient continues to complain of some cramping abdominal discomfort.  Temperature while cancer Center was 98.8.  Patient has history C. Difficile in the past.  Stool for C. Difficile obtained; but will not results for several hours.  Urinalysis was negative for infection; pending urine culture.  Also obtained blood cultures x2. Chest xray obtained today was negative for acute findings.   Given patient's fever to maximum 100.3 within the past 24 hours-will prescribe Levaquin for the patient to start this evening.  Patient was advised to go to the emergency department any new worries or concerns.   Multiple myeloma Patient was advised to hold all further Revlimid for the time being.   Pt will return in morning for follow up of c-diff results, IV fluid rehydration.    He is also scheduled for labs and follow up with Dr. Alvy Bimler on 06/18/14.    Patient stated understanding of all instructions; and was in agreement with this plan of care. The patient knows to call the clinic with any problems, questions or concerns.   Review/collaboration with Dr. Alvy Bimler regarding all aspects of patient's visit today.   Total time spent with patient was 40 minutes;  with greater than 75 percent of that time spent in face to  face counseling regarding patient's symptoms,  and coordination of care and follow up.  Disclaimer: This note was dictated with voice recognition software. Similar sounding words can inadvertently be transcribed and may not be corrected upon review.   Drue Second, NP 06/15/2014

## 2014-06-18 ENCOUNTER — Other Ambulatory Visit: Payer: BC Managed Care – PPO

## 2014-06-18 ENCOUNTER — Encounter: Payer: Self-pay | Admitting: Hematology and Oncology

## 2014-06-18 ENCOUNTER — Telehealth: Payer: Self-pay

## 2014-06-18 ENCOUNTER — Other Ambulatory Visit (HOSPITAL_BASED_OUTPATIENT_CLINIC_OR_DEPARTMENT_OTHER): Payer: BC Managed Care – PPO

## 2014-06-18 ENCOUNTER — Ambulatory Visit (HOSPITAL_BASED_OUTPATIENT_CLINIC_OR_DEPARTMENT_OTHER): Payer: BC Managed Care – PPO | Admitting: Hematology and Oncology

## 2014-06-18 ENCOUNTER — Telehealth: Payer: Self-pay | Admitting: Hematology and Oncology

## 2014-06-18 VITALS — BP 119/76 | HR 77 | Temp 98.1°F | Resp 18 | Ht 72.0 in | Wt 195.0 lb

## 2014-06-18 DIAGNOSIS — R509 Fever, unspecified: Secondary | ICD-10-CM | POA: Diagnosis not present

## 2014-06-18 DIAGNOSIS — D649 Anemia, unspecified: Secondary | ICD-10-CM | POA: Diagnosis not present

## 2014-06-18 DIAGNOSIS — C9000 Multiple myeloma not having achieved remission: Secondary | ICD-10-CM

## 2014-06-18 DIAGNOSIS — A0472 Enterocolitis due to Clostridium difficile, not specified as recurrent: Secondary | ICD-10-CM

## 2014-06-18 DIAGNOSIS — A047 Enterocolitis due to Clostridium difficile: Secondary | ICD-10-CM

## 2014-06-18 DIAGNOSIS — D630 Anemia in neoplastic disease: Secondary | ICD-10-CM

## 2014-06-18 LAB — CBC WITH DIFFERENTIAL/PLATELET
BASO%: 0.2 % (ref 0.0–2.0)
BASOS ABS: 0 10*3/uL (ref 0.0–0.1)
EOS%: 7.2 % — ABNORMAL HIGH (ref 0.0–7.0)
Eosinophils Absolute: 0.4 10*3/uL (ref 0.0–0.5)
HCT: 37.9 % — ABNORMAL LOW (ref 38.4–49.9)
HGB: 12.7 g/dL — ABNORMAL LOW (ref 13.0–17.1)
LYMPH%: 33.5 % (ref 14.0–49.0)
MCH: 30.6 pg (ref 27.2–33.4)
MCHC: 33.5 g/dL (ref 32.0–36.0)
MCV: 91.3 fL (ref 79.3–98.0)
MONO#: 0.8 10*3/uL (ref 0.1–0.9)
MONO%: 14.3 % — ABNORMAL HIGH (ref 0.0–14.0)
NEUT%: 44.8 % (ref 39.0–75.0)
NEUTROS ABS: 2.5 10*3/uL (ref 1.5–6.5)
Platelets: 171 10*3/uL (ref 140–400)
RBC: 4.15 10*6/uL — ABNORMAL LOW (ref 4.20–5.82)
RDW: 15.1 % — ABNORMAL HIGH (ref 11.0–14.6)
WBC: 5.6 10*3/uL (ref 4.0–10.3)
lymph#: 1.9 10*3/uL (ref 0.9–3.3)

## 2014-06-18 LAB — COMPREHENSIVE METABOLIC PANEL (CC13)
ALK PHOS: 59 U/L (ref 40–150)
ALT: 34 U/L (ref 0–55)
AST: 26 U/L (ref 5–34)
Albumin: 3.9 g/dL (ref 3.5–5.0)
Anion Gap: 10 mEq/L (ref 3–11)
BUN: 16.9 mg/dL (ref 7.0–26.0)
CO2: 23 mEq/L (ref 22–29)
Calcium: 9.1 mg/dL (ref 8.4–10.4)
Chloride: 106 mEq/L (ref 98–109)
Creatinine: 1 mg/dL (ref 0.7–1.3)
EGFR: 78 mL/min/{1.73_m2} — AB (ref >90)
GLUCOSE: 124 mg/dL (ref 70–140)
POTASSIUM: 3.9 meq/L (ref 3.5–5.1)
SODIUM: 138 meq/L (ref 136–145)
TOTAL PROTEIN: 7.7 g/dL (ref 6.4–8.3)
Total Bilirubin: 1.02 mg/dL (ref 0.20–1.20)

## 2014-06-18 LAB — STOOL CULTURE

## 2014-06-18 NOTE — Assessment & Plan Note (Signed)
The patient had persistent fever of unknown etiology. I will refer him to infectious disease for further management. Clinically, the patient does not appear to be septic.

## 2014-06-18 NOTE — Assessment & Plan Note (Signed)
Unfortunately, the patient has recurrent fever of unknown origin. Chest x-ray, urine culture blood culture and stool culture were all negative except for persistent positive test for C. difficile PCR. I spoke with infectious disease consultant on call today. He felt it is appropriate to get the patient referred to outpatient infectious disease for further workup. In the meantime, I will continue to hold off his chemotherapy treatment until active infection has been ruled out.

## 2014-06-18 NOTE — Telephone Encounter (Signed)
[name] follow up. Pt on flagyl, not feeling cold chills, not feel like having fever spikes. He does have appt with Dr Alvy Bimler today.

## 2014-06-18 NOTE — Progress Notes (Signed)
[facility] OFFICE PROGRESS NOTE  Patient Care Team: Kevin Little, MD as PCP - General (Family Medicine) Ni Gorsuch, MD as Consulting Physician (Hematology and Oncology)  SUMMARY OF ONCOLOGIC HISTORY: Oncology History   ISS Staging II, M-spike 1.7, IgG 2420, kappa 30.1, beta2microglobulin 4.48, Creatinine 1.2, calcium 9.8, hemoglobin 13.2, albumin 4.2     Multiple myeloma   01/10/2003 Pathology Results [id number] surgical pathology showed pure seminoma with lymphovascular invasion, pT2 NX MX   01/10/2003 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 congo red staing reveals amyloid deposition within some of the amorphous material and within blood vessels. Normal cytogenetics.   02/14/2014 Imaging PET CT scan showed diffuse skeletal involvement   02/26/2014 - 04/25/2014 Chemotherapy He is started on chemotherapy with Velcade, Cytoxan and dexamethasone. Cytoxan was discontinued due to recurrent admission and recent clostridium Diff infection.  Velcade was suspected to be a cause of fever that was discontinued.   03/09/2014 - 03/11/2014 Hospital Admission He was admitted to the hospital with fevers and chills and was diagnosed with pneumonia   03/22/2014 - 03/26/2014 Hospital Admission He is admitted to the hospital again for fever, chills and possible pneumonia   04/19/2014 - 04/22/2014 Hospital Admission He is admitted to the hospital for fever, chills and diarrhea and was subsequently found to have C. difficile infection   05/04/2014 - 05/05/2014 Hospital  Admission The patient was admitted to the hospital again with fever and chills. No source of infection was found and he was not given antibiotics.   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   06/04/2014 - 06/14/2014 Chemotherapy He was restarted back on Revlimid with dexamethasone only. Treatment was put on hold due to recurrent fever of unknown origin    INTERVAL HISTORY: Please see below for problem oriented charting. He was seen last week for further evaluation due to recurrent fever. He had some chills when he discovered to have fever at work. He denies any abdominal cramping or loose stool. He had multiple tests performed and was found to have positive C. difficile by PCR in his stool and was started on metronidazole. Since then, he denies further fevers or chills.  REVIEW OF SYSTEMS:   Eyes: Denies blurriness of vision Ears, nose, mouth, throat, and face: Denies mucositis or sore throat Respiratory: Denies cough, dyspnea or wheezes Cardiovascular: Denies palpitation, chest discomfort or lower extremity swelling Gastrointestinal:  Denies nausea, heartburn or change in bowel habits Skin: Denies abnormal skin rashes Lymphatics: Denies new lymphadenopathy or easy bruising Neurological:Denies numbness, tingling or new weaknesses Behavioral/Psych: Mood is stable, no new changes  All other systems were reviewed with the patient and are negative.  I have reviewed the past medical history, past surgical history, social history and family history with the patient and they are unchanged from previous note.  ALLERGIES:  is allergic to velcade and zometa.  MEDICATIONS:  Current Outpatient Prescriptions  Medication Sig Dispense Refill  . acetaminophen (TYLENOL) 325 MG tablet Take 2   tablets (650 mg total) by mouth every 6 (six) hours as needed for mild pain (or  Fever >/= 101). 30 tablet 0  . acyclovir (ZOVIRAX) 400 MG tablet Take 400 mg by mouth daily.    . aspirin 81 MG tablet Take 81 mg by mouth daily.    . CALCIUM PO Take 1,000 mg by mouth daily.    . Cholecalciferol (VITAMIN D3) 2000 UNITS TABS Take 1 tablet by mouth daily.     . folic acid (FOLVITE) 1 MG tablet Take 1 tablet (1 mg total) by mouth daily. 30 tablet 2  . insulin glargine (LANTUS) 100 UNIT/ML injection Inject 5-6 Units into the skin at bedtime. Take 5 units at Bedtime Everyday Except on Days of Chemo Take 6 units if blood sugar rises    . lenalidomide (REVLIMID) 10 MG capsule Take 1 capsule (10 mg total) by mouth daily. 21 capsule 0  . Magnesium 400 MG TABS Take 400 mg by mouth daily.    . metFORMIN (GLUCOPHAGE) 500 MG tablet Take 500 mg by mouth 2 (two) times daily with a meal. On days of Chemo only  increase dose to 1000mg in the evening as needed for rise in blood sugar levels    . metroNIDAZOLE (FLAGYL) 500 MG tablet Take 1 tablet (500 mg total) by mouth 3 (three) times daily. 45 tablet 1  . Multiple Vitamin (MULTIVITAMIN) tablet Take 1 tablet by mouth daily.    . omeprazole (PRILOSEC) 20 MG capsule Take 20 mg by mouth daily.    . oxyCODONE (OXY IR/ROXICODONE) 5 MG immediate release tablet Take 1 tablet (5 mg total) by mouth every 6 (six) hours as needed for severe pain. 30 tablet 0  . pravastatin (PRAVACHOL) 40 MG tablet Take 40 mg by mouth daily. Daily  1  . saccharomyces boulardii (FLORASTOR) 250 MG capsule Take 1 capsule (250 mg total) by mouth 2 (two) times daily. 60 capsule 2   No current facility-administered medications for this visit.    PHYSICAL EXAMINATION: ECOG PERFORMANCE STATUS: 0 - Asymptomatic  Filed Vitals:   06/18/14 1204  BP: 119/76  Pulse: 77  Temp: 98.1 F (36.7 C)  Resp: 18   Filed Weights   06/18/14 1204  Weight: 195 lb (88.451 kg)    GENERAL:alert, no distress and comfortable SKIN: skin color, texture, turgor are normal, no rashes or  significant lesions EYES: normal, Conjunctiva are pink and non-injected, sclera clear OROPHARYNX:no exudate, no erythema and lips, buccal mucosa, and tongue normal  NECK: supple, thyroid normal size, non-tender, without nodularity LYMPH:  no palpable lymphadenopathy in the cervical, axillary or inguinal LUNGS: clear to auscultation and percussion with normal breathing effort HEART: regular rate & rhythm and no murmurs and no lower extremity edema ABDOMEN:abdomen soft, non-tender and normal bowel sounds Musculoskeletal:no cyanosis of digits and no clubbing  NEURO: alert & oriented x 3 with fluent speech, no focal motor/sensory deficits  LABORATORY DATA:  I have reviewed the data as listed    Component Value Date/Time   NA 138 06/18/2014 1152   NA 139 05/27/2014 1000   K 3.9 06/18/2014 1152   K 3.3* 05/27/2014 1000   CL 107 05/27/2014 1000   CO2 23 06/18/2014 1152   CO2 27 05/27/2014 1000   GLUCOSE 124 06/18/2014 1152   GLUCOSE 127* 05/27/2014 1000   BUN 16.9 06/18/2014 1152   BUN 12 05/27/2014 1000   CREATININE 1.0 06/18/2014 1152   CREATININE 1.10 05/27/2014 1000   CALCIUM 9.1 06/18/2014   1152   CALCIUM 8.6 05/27/2014 1000   PROT 7.7 06/18/2014 1152   PROT 7.9 05/27/2014 1000   ALBUMIN 3.9 06/18/2014 1152   ALBUMIN 3.7 05/27/2014 1000   AST 26 06/18/2014 1152   AST 37 05/27/2014 1000   ALT 34 06/18/2014 1152   ALT 32 05/27/2014 1000   ALKPHOS 59 06/18/2014 1152   ALKPHOS 47 05/27/2014 1000   BILITOT 1.02 06/18/2014 1152   BILITOT 0.5 05/27/2014 1000   GFRNONAA 70* 05/27/2014 1000   GFRAA 81* 05/27/2014 1000    No results found for: SPEP, UPEP  Lab Results  Component Value Date   WBC 5.6 06/18/2014   NEUTROABS 2.5 06/18/2014   HGB 12.7* 06/18/2014   HCT 37.9* 06/18/2014   MCV 91.3 06/18/2014   PLT 171 06/18/2014      Chemistry      Component Value Date/Time   NA 138 06/18/2014 1152   NA 139 05/27/2014 1000   K 3.9 06/18/2014 1152   K 3.3* 05/27/2014 1000    CL 107 05/27/2014 1000   CO2 23 06/18/2014 1152   CO2 27 05/27/2014 1000   BUN 16.9 06/18/2014 1152   BUN 12 05/27/2014 1000   CREATININE 1.0 06/18/2014 1152   CREATININE 1.10 05/27/2014 1000      Component Value Date/Time   CALCIUM 9.1 06/18/2014 1152   CALCIUM 8.6 05/27/2014 1000   ALKPHOS 59 06/18/2014 1152   ALKPHOS 47 05/27/2014 1000   AST 26 06/18/2014 1152   AST 37 05/27/2014 1000   ALT 34 06/18/2014 1152   ALT 32 05/27/2014 1000   BILITOT 1.02 06/18/2014 1152   BILITOT 0.5 05/27/2014 1000       RADIOGRAPHIC STUDIES: I have reviewed his most recent chest x-ray I have personally reviewed the radiological images as listed and agreed with the findings in the report.   ASSESSMENT & PLAN:  Multiple myeloma Unfortunately, the patient has recurrent fever of unknown origin. Chest x-ray, urine culture blood culture and stool culture were all negative except for persistent positive test for C. difficile PCR. I spoke with infectious disease consultant on call today. He felt it is appropriate to get the patient referred to outpatient infectious disease for further workup. In the meantime, I will continue to hold off his chemotherapy treatment until active infection has been ruled out.   Anemia in neoplastic disease This is likely due to recent treatment. The patient denies recent history of bleeding such as epistaxis, hematuria or hematochezia. He is asymptomatic from the anemia. I will observe for now.   Colitis, Clostridium difficile The patient has persistent positive C. difficile stool tests. However, clinically, he does not have any abdominal cramping or diarrhea. He was given another course of metronidazole and I told him to finish the course while awaiting for referral to see infectious disease as an outpatient.   Fever The patient had persistent fever of unknown etiology. I will refer him to infectious disease for further management. Clinically, the patient does  not appear to be septic.    Orders Placed This Encounter  Procedures  . Ambulatory referral to Infectious Disease    Referral Priority:  Urgent    Referral Type:  Consultation    Referral Reason:  Specialty Services Required    Requested Specialty:  Infectious Diseases    Number of Visits Requested:  1   All questions were answered. The patient knows to call the clinic with any problems, questions or concerns. No barriers to learning   was detected. I spent 25 minutes counseling the patient face to face. The total time spent in the appointment was 30 minutes and more than 50% was on counseling and review of test results     GORSUCH, NI, MD 06/18/2014 1:33 PM    

## 2014-06-18 NOTE — Telephone Encounter (Signed)
Gave avs & calendar for April. °

## 2014-06-18 NOTE — Assessment & Plan Note (Signed)
This is likely due to recent treatment. The patient denies recent history of bleeding such as epistaxis, hematuria or hematochezia. He is asymptomatic from the anemia. I will observe for now.    

## 2014-06-18 NOTE — Assessment & Plan Note (Signed)
The patient has persistent positive C. difficile stool tests. However, clinically, he does not have any abdominal cramping or diarrhea. He was given another course of metronidazole and I told him to finish the course while awaiting for referral to see infectious disease as an outpatient.

## 2014-06-19 NOTE — Telephone Encounter (Signed)
Pt seen by Selena Lesser NP.

## 2014-06-20 LAB — SPEP & IFE WITH QIG
ALPHA-1-GLOBULIN: 0.2 g/dL (ref 0.2–0.3)
Abnormal Protein Band1: 1.1 g/dL
Albumin ELP: 4.2 g/dL (ref 3.8–4.8)
Alpha-2-Globulin: 0.9 g/dL (ref 0.5–0.9)
BETA 2: 0.2 g/dL (ref 0.2–0.5)
BETA GLOBULIN: 0.4 g/dL (ref 0.4–0.6)
Gamma Globulin: 1.5 g/dL (ref 0.8–1.7)
IGA: 25 mg/dL — AB (ref 68–379)
IGG (IMMUNOGLOBIN G), SERUM: 1590 mg/dL (ref 650–1600)
IgM, Serum: 22 mg/dL — ABNORMAL LOW (ref 41–251)
Total Protein, Serum Electrophoresis: 7.4 g/dL (ref 6.1–8.1)

## 2014-06-20 LAB — CULTURE, BLOOD (SINGLE)

## 2014-06-20 LAB — KAPPA/LAMBDA LIGHT CHAINS
Kappa free light chain: 10.6 mg/dL — ABNORMAL HIGH (ref 0.33–1.94)
Kappa:Lambda Ratio: 13.09 — ABNORMAL HIGH (ref 0.26–1.65)
Lambda Free Lght Chn: 0.81 mg/dL (ref 0.57–2.63)

## 2014-06-22 ENCOUNTER — Telehealth: Payer: Self-pay | Admitting: Hematology and Oncology

## 2014-06-22 NOTE — Telephone Encounter (Signed)
Pt called about r/s visit here states his referral apt with Dr. Rhina Brackett Dam is after his visit with Dr. Alvy Bimler and wants to know does he need to r/s this apt to after his referral apt, transferred pt to Dr. Alvy Bimler desk nurse.... KJ

## 2014-06-22 NOTE — Telephone Encounter (Signed)
Tell him to keep the appt so I can check on him

## 2014-06-22 NOTE — Telephone Encounter (Signed)
Pt left VM states he has appt to see Dr. Tommy Medal on 4/20.  He has appt to see Dr. Alvy Bimler on 4/11 and asks if this appt needs to be moved to after he can see Dr. Tommy Medal?   He is also asking when he is able to start chemo again?

## 2014-06-22 NOTE — Telephone Encounter (Signed)
Informed pt of Dr. Calton Dach reply to keep appt w/ her as scheduled on 4/11. He verbalized understanding.

## 2014-06-27 ENCOUNTER — Telehealth: Payer: Self-pay | Admitting: *Deleted

## 2014-06-27 NOTE — Telephone Encounter (Signed)
Patient called this morning.  He has had a bad cough for a couple of weeks and he has an appt.to see his PCP this afternoon at 2:30.  He left message asking if he should see Dr. Alvy Bimler instead?   (His infectious disease consult is not until 07-11-14).  Will route this to Dr. Alvy Bimler and Cameo RN.  His call back is [phone]

## 2014-06-27 NOTE — Telephone Encounter (Signed)
Informed pt of Dr. Calton Dach reply below,  Keep appt w/ PCP as scheduled today.  He verbalized understanding.

## 2014-06-27 NOTE — Telephone Encounter (Signed)
I have done extensive work-up in the past and have no other ideas/suggestions Maybe the PCP can give a different perspective

## 2014-07-02 ENCOUNTER — Other Ambulatory Visit (HOSPITAL_BASED_OUTPATIENT_CLINIC_OR_DEPARTMENT_OTHER): Payer: BC Managed Care – PPO

## 2014-07-02 ENCOUNTER — Ambulatory Visit (HOSPITAL_BASED_OUTPATIENT_CLINIC_OR_DEPARTMENT_OTHER): Payer: BC Managed Care – PPO | Admitting: Hematology and Oncology

## 2014-07-02 ENCOUNTER — Telehealth: Payer: Self-pay | Admitting: Hematology and Oncology

## 2014-07-02 ENCOUNTER — Encounter: Payer: Self-pay | Admitting: Hematology and Oncology

## 2014-07-02 VITALS — BP 131/77 | HR 78 | Temp 98.1°F | Resp 16 | Ht 72.0 in | Wt 193.1 lb

## 2014-07-02 DIAGNOSIS — E859 Amyloidosis, unspecified: Secondary | ICD-10-CM

## 2014-07-02 DIAGNOSIS — R05 Cough: Secondary | ICD-10-CM

## 2014-07-02 DIAGNOSIS — R509 Fever, unspecified: Secondary | ICD-10-CM | POA: Diagnosis not present

## 2014-07-02 DIAGNOSIS — R059 Cough, unspecified: Secondary | ICD-10-CM

## 2014-07-02 DIAGNOSIS — M79661 Pain in right lower leg: Secondary | ICD-10-CM | POA: Insufficient documentation

## 2014-07-02 DIAGNOSIS — C9000 Multiple myeloma not having achieved remission: Secondary | ICD-10-CM | POA: Diagnosis not present

## 2014-07-02 DIAGNOSIS — M79662 Pain in left lower leg: Secondary | ICD-10-CM

## 2014-07-02 HISTORY — DX: Pain in right lower leg: M79.661

## 2014-07-02 HISTORY — DX: Cough, unspecified: R05.9

## 2014-07-02 LAB — CBC WITH DIFFERENTIAL/PLATELET
BASO%: 0.9 % (ref 0.0–2.0)
Basophils Absolute: 0.1 10*3/uL (ref 0.0–0.1)
EOS%: 5.1 % (ref 0.0–7.0)
Eosinophils Absolute: 0.3 10*3/uL (ref 0.0–0.5)
HEMATOCRIT: 38.5 % (ref 38.4–49.9)
HEMOGLOBIN: 12.7 g/dL — AB (ref 13.0–17.1)
LYMPH#: 2 10*3/uL (ref 0.9–3.3)
LYMPH%: 36.2 % (ref 14.0–49.0)
MCH: 30.2 pg (ref 27.2–33.4)
MCHC: 33 g/dL (ref 32.0–36.0)
MCV: 91.5 fL (ref 79.3–98.0)
MONO#: 0.8 10*3/uL (ref 0.1–0.9)
MONO%: 15.1 % — ABNORMAL HIGH (ref 0.0–14.0)
NEUT#: 2.4 10*3/uL (ref 1.5–6.5)
NEUT%: 42.7 % (ref 39.0–75.0)
Platelets: 220 10*3/uL (ref 140–400)
RBC: 4.2 10*6/uL (ref 4.20–5.82)
RDW: 15.8 % — AB (ref 11.0–14.6)
WBC: 5.5 10*3/uL (ref 4.0–10.3)

## 2014-07-02 LAB — COMPREHENSIVE METABOLIC PANEL (CC13)
ALT: 24 U/L (ref 0–55)
ANION GAP: 9 meq/L (ref 3–11)
AST: 26 U/L (ref 5–34)
Albumin: 4 g/dL (ref 3.5–5.0)
Alkaline Phosphatase: 56 U/L (ref 40–150)
BILIRUBIN TOTAL: 0.63 mg/dL (ref 0.20–1.20)
BUN: 12.4 mg/dL (ref 7.0–26.0)
CO2: 29 meq/L (ref 22–29)
CREATININE: 1.1 mg/dL (ref 0.7–1.3)
Calcium: 9.6 mg/dL (ref 8.4–10.4)
Chloride: 103 mEq/L (ref 98–109)
EGFR: 71 mL/min/{1.73_m2} — AB (ref >90)
GLUCOSE: 106 mg/dL (ref 70–140)
Potassium: 3.6 mEq/L (ref 3.5–5.1)
Sodium: 142 mEq/L (ref 136–145)
Total Protein: 7.5 g/dL (ref 6.4–8.3)

## 2014-07-02 NOTE — Telephone Encounter (Signed)
Called patient and he is aware of his doppler appointment

## 2014-07-03 NOTE — Assessment & Plan Note (Signed)
I told the patient they are available data of using combination of Revlimid, Velcade and dexamethasone to treat amyloidosis. His M spike protein is still greater than 1 g. The patient will continue treatment for now and once I can document good response to treatment, I will refer him to his transplant physician for bone marrow transplant in the near future

## 2014-07-03 NOTE — Assessment & Plan Note (Signed)
He has bilateral calf pain. Certainly, I would be concerned about risk of blood clot. I recommend bilateral lower extremity Doppler ultrasound to exclude DVT.

## 2014-07-03 NOTE — Assessment & Plan Note (Signed)
Unfortunately, the patient has recurrent fever of unknown origin. Chest x-ray, urine culture blood culture and stool culture were all negative except for persistent positive test for C. difficile PCR. I will continue to hold treatment until after infectious disease consultation I reassured the patient and his wife that his myeloma tests showed stable disease control.

## 2014-07-03 NOTE — Assessment & Plan Note (Signed)
He has recurrence cough of unknown etiology. I reviewed the imaging from last CT scan showed bibasilar atelectasis. The lung is thought to be the source of his fever. He has appointment to see pulmonologist and infectious disease. I recommend CT scan of the chest to exclude ongoing infection or bronchiectasis, especially given his immunocompromise state

## 2014-07-03 NOTE — Progress Notes (Signed)
[facility] OFFICE PROGRESS NOTE  Patient Care Team: Hulan Fess, MD as PCP - General (Family Medicine) Heath Lark, MD as Consulting Physician (Hematology and Oncology) Truman Hayward, MD as Consulting Physician (Infectious Diseases)  SUMMARY OF ONCOLOGIC HISTORY: Oncology History   ISS Staging II, M-spike 1.7, IgG 2420, kappa 30.1, beta20mcroglobulin 4.48, Creatinine 1.2, calcium 9.8, hemoglobin 13.2, albumin 4.2     Multiple myeloma   01/10/2003 Pathology Results [id number] pathology showed pure seminoma with lymphovascular invasion, pT2 NX MX   01/10/2003 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 congo red staing reveals amyloid deposition within some of the amorphous material and within blood vessels. Normal cytogenetics.   02/14/2014 Imaging PET CT scan showed diffuse skeletal involvement   02/26/2014 - 04/25/2014 Chemotherapy He is started on chemotherapy with Velcade, Cytoxan and dexamethasone. Cytoxan was discontinued due to recurrent admission and recent clostridium Diff infection.  Velcade was suspected to be a cause of fever that was discontinued.   03/09/2014 - 03/11/2014 Hospital Admission He was admitted to the hospital with fevers and chills and was diagnosed with pneumonia   03/22/2014 - 03/26/2014 Hospital Admission He is admitted to the hospital again for fever, chills and possible pneumonia   04/19/2014 - 04/22/2014 Hospital Admission He is admitted to the hospital for fever, chills and diarrhea and was subsequently  found to have C. difficile infection   05/04/2014 - 05/05/2014 Hospital Admission The patient was admitted to the hospital again with fever and chills. No source of infection was found and he was not given antibiotics.   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   06/04/2014 - 06/14/2014 Chemotherapy He was restarted back on Revlimid with dexamethasone only. Treatment was put on hold due to recurrent fever of unknown origin    INTERVAL HISTORY: Please see below for problem oriented charting. He returns for further follow-up. He continued to have persistent low-grade fever and chronic cough which is generally nonproductive. Denies diarrhea or change in bowel habits. He complained of bilateral lower extremity cramps without swelling.  REVIEW OF SYSTEMS:   Constitutional: Denies fevers, chills or abnormal weight loss Eyes: Denies blurriness of vision Ears, nose, mouth, throat, and face: Denies mucositis or sore throat Cardiovascular: Denies palpitation, chest discomfort or lower extremity swelling Gastrointestinal:  Denies nausea, heartburn or change in bowel habits Skin: Denies abnormal skin rashes Lymphatics: Denies new lymphadenopathy or easy bruising Neurological:Denies numbness, tingling or new weaknesses Behavioral/Psych: Mood is stable, no new changes  All other systems were reviewed with the patient and are negative.  I have reviewed the past medical history, past surgical history, social history and family history with the patient and they are unchanged from previous note.  ALLERGIES:  is allergic to velcade and zometa.  MEDICATIONS:  Current Outpatient Prescriptions  Medication Sig Dispense Refill  . acetaminophen (TYLENOL) 325 MG tablet Take 2 tablets (650 mg total) by mouth every 6 (six) hours as needed for mild pain (or  Fever >/= 101). 30 tablet 0   . acyclovir (ZOVIRAX) 400 MG tablet Take 400 mg by mouth daily.    Marland Kitchen aspirin 81 MG tablet Take 81 mg by mouth daily.    Marland Kitchen CALCIUM PO Take 1,000 mg by mouth daily.    . Cholecalciferol (VITAMIN D3) 2000 UNITS TABS Take 1 tablet by mouth daily.     . folic acid (FOLVITE) 1 MG tablet Take 1 tablet (1 mg total) by mouth daily. 30 tablet 2  . insulin glargine (LANTUS) 100 UNIT/ML injection Inject 5-6 Units into the skin at bedtime. Take 5 units at Bedtime Everyday Except on Days of Chemo Take 6 units if blood sugar rises    . Magnesium 400 MG TABS Take 400 mg by mouth daily.    . metFORMIN (GLUCOPHAGE) 500 MG tablet Take 500 mg by mouth 2 (two) times daily with a meal. On days of Chemo only  increase dose to 1092m in the evening as needed for rise in blood sugar levels    . mometasone (ASMANEX) 220 MCG/INH inhaler Inhale 2 puffs into the lungs 2 (two) times daily.    . Multiple Vitamin (MULTIVITAMIN) tablet Take 1 tablet by mouth daily.    .Marland KitchenoxyCODONE (OXY IR/ROXICODONE) 5 MG immediate release tablet Take 1 tablet (5 mg total) by mouth every 6 (six) hours as needed for severe pain. 30 tablet 0  . pravastatin (PRAVACHOL) 40 MG tablet Take 40 mg by mouth daily. Daily  1  . saccharomyces boulardii (FLORASTOR) 250 MG capsule Take 1 capsule (250 mg total) by mouth 2 (two) times daily. 60 capsule 2  . lenalidomide (REVLIMID) 10 MG capsule Take 1 capsule (10 mg total) by mouth daily. (Patient not taking: Reported on 07/02/2014) 21 capsule 0   No current facility-administered medications for this visit.    PHYSICAL EXAMINATION: ECOG PERFORMANCE STATUS: 1 - Symptomatic but completely ambulatory  Filed Vitals:   07/02/14 1111  BP: 131/77  Pulse: 78  Temp: 98.1 F (36.7 C)  Resp: 16   Filed Weights   07/02/14 1111  Weight: 193 lb 1.6 oz (87.59 kg)    GENERAL:alert, no distress and comfortable SKIN: skin color, texture, turgor are normal, no rashes or significant lesions EYES: normal,  Conjunctiva are pink and non-injected, sclera clear OROPHARYNX:no exudate, no erythema and lips, buccal mucosa, and tongue normal  NECK: supple, thyroid normal size, non-tender, without nodularity LYMPH:  no palpable lymphadenopathy in the cervical, axillary or inguinal LUNGS: clear to auscultation and percussion with normal breathing effort HEART: regular rate & rhythm and no murmurs and no lower extremity edema ABDOMEN:abdomen soft, non-tender and normal bowel sounds Musculoskeletal:no cyanosis of digits and no clubbing  NEURO: alert & oriented x 3 with fluent speech, no focal motor/sensory deficits  LABORATORY DATA:  I have reviewed the data as listed    Component Value Date/Time   NA 142 07/02/2014 1101   NA 139 05/27/2014 1000   K 3.6 07/02/2014 1101   K 3.3* 05/27/2014 1000   CL 107 05/27/2014 1000   CO2 29 07/02/2014 1101   CO2 27 05/27/2014 1000   GLUCOSE 106 07/02/2014 1101   GLUCOSE 127* 05/27/2014 1000   BUN 12.4 07/02/2014 1101   BUN 12 05/27/2014 1000   CREATININE 1.1 07/02/2014 1101   CREATININE 1.10 05/27/2014 1000   CALCIUM 9.6 07/02/2014 1101   CALCIUM 8.6 05/27/2014 1000   PROT 7.5 07/02/2014 1101   PROT 7.9 05/27/2014 1000   ALBUMIN 4.0  07/02/2014 1101   ALBUMIN 3.7 05/27/2014 1000   AST 26 07/02/2014 1101   AST 37 05/27/2014 1000   ALT 24 07/02/2014 1101   ALT 32 05/27/2014 1000   ALKPHOS 56 07/02/2014 1101   ALKPHOS 47 05/27/2014 1000   BILITOT 0.63 07/02/2014 1101   BILITOT 0.5 05/27/2014 1000   GFRNONAA 70* 05/27/2014 1000   GFRAA 81* 05/27/2014 1000    No results found for: SPEP, UPEP  Lab Results  Component Value Date   WBC 5.5 07/02/2014   NEUTROABS 2.4 07/02/2014   HGB 12.7* 07/02/2014   HCT 38.5 07/02/2014   MCV 91.5 07/02/2014   PLT 220 07/02/2014      Chemistry      Component Value Date/Time   NA 142 07/02/2014 1101   NA 139 05/27/2014 1000   K 3.6 07/02/2014 1101   K 3.3* 05/27/2014 1000   CL 107 05/27/2014 1000   CO2 29  07/02/2014 1101   CO2 27 05/27/2014 1000   BUN 12.4 07/02/2014 1101   BUN 12 05/27/2014 1000   CREATININE 1.1 07/02/2014 1101   CREATININE 1.10 05/27/2014 1000      Component Value Date/Time   CALCIUM 9.6 07/02/2014 1101   CALCIUM 8.6 05/27/2014 1000   ALKPHOS 56 07/02/2014 1101   ALKPHOS 47 05/27/2014 1000   AST 26 07/02/2014 1101   AST 37 05/27/2014 1000   ALT 24 07/02/2014 1101   ALT 32 05/27/2014 1000   BILITOT 0.63 07/02/2014 1101   BILITOT 0.5 05/27/2014 1000     ASSESSMENT & PLAN:  Multiple myeloma Unfortunately, the patient has recurrent fever of unknown origin. Chest x-ray, urine culture blood culture and stool culture were all negative except for persistent positive test for C. difficile PCR. I will continue to hold treatment until after infectious disease consultation I reassured the patient and his wife that his myeloma tests showed stable disease control.     Amyloidosis I told the patient they are available data of using combination of Revlimid, Velcade and dexamethasone to treat amyloidosis. His M spike protein is still greater than 1 g. The patient will continue treatment for now and once I can document good response to treatment, I will refer him to his transplant physician for bone marrow transplant in the near future   Cough He has recurrence cough of unknown etiology. I reviewed the imaging from last CT scan showed bibasilar atelectasis. The lung is thought to be the source of his fever. He has appointment to see pulmonologist and infectious disease. I recommend CT scan of the chest to exclude ongoing infection or bronchiectasis, especially given his immunocompromise state   Bilateral calf pain He has bilateral calf pain. Certainly, I would be concerned about risk of blood clot. I recommend bilateral lower extremity Doppler ultrasound to exclude DVT.    Orders Placed This Encounter  Procedures  . CT Chest Wo Contrast    Standing Status: Future      Number of Occurrences:      Standing Expiration Date: 09/01/2015    Order Specific Question:  Reason for Exam (SYMPTOM  OR DIAGNOSIS REQUIRED)    Answer:  chronic cough, recurrent lung infection    Order Specific Question:  Preferred imaging location?    Answer:  [facility]  . Lower Extremity Venous Duplex Bilateral    Standing Status: Future     Number of Occurrences:      Standing Expiration Date: 07/02/2015    Order Specific Question:  Laterality    Answer:  Bilateral    Order Specific Question:  Where should this test be performed:    Answer:  [facility]   All questions were answered. The patient knows to call the clinic with any problems, questions or concerns. No barriers to learning was detected. I spent 25 minutes counseling the patient face to face. The total time spent in the appointment was 30 minutes and more than 50% was on counseling and review of test results     [facility], [place], MD 07/03/2014 12:11 PM

## 2014-07-06 ENCOUNTER — Encounter (HOSPITAL_COMMUNITY): Payer: BC Managed Care – PPO

## 2014-07-06 ENCOUNTER — Ambulatory Visit (HOSPITAL_COMMUNITY)
Admission: RE | Admit: 2014-07-06 | Discharge: 2014-07-06 | Disposition: A | Discharge status: Home / Self Care | Payer: BC Managed Care – PPO | Source: Ambulatory Visit | Attending: Hematology and Oncology | Admitting: Hematology and Oncology

## 2014-07-06 ENCOUNTER — Encounter (HOSPITAL_COMMUNITY): Payer: Self-pay

## 2014-07-06 ENCOUNTER — Telehealth: Payer: Self-pay | Admitting: Medical Oncology

## 2014-07-06 ENCOUNTER — Ambulatory Visit (HOSPITAL_COMMUNITY): Payer: BC Managed Care – PPO

## 2014-07-06 DIAGNOSIS — C9000 Multiple myeloma not having achieved remission: Secondary | ICD-10-CM | POA: Insufficient documentation

## 2014-07-06 DIAGNOSIS — R05 Cough: Secondary | ICD-10-CM | POA: Insufficient documentation

## 2014-07-06 DIAGNOSIS — M79661 Pain in right lower leg: Secondary | ICD-10-CM

## 2014-07-06 DIAGNOSIS — M79609 Pain in unspecified limb: Secondary | ICD-10-CM | POA: Diagnosis not present

## 2014-07-06 DIAGNOSIS — R059 Cough, unspecified: Secondary | ICD-10-CM

## 2014-07-06 DIAGNOSIS — M79662 Pain in left lower leg: Principal | ICD-10-CM

## 2014-07-06 NOTE — Progress Notes (Signed)
*  PRELIMINARY RESULTS* Vascular Ultrasound Lower extremity venous duplex has been completed.  Preliminary findings: Negative for DVT.  Attempted call report to [phone]. Left message on triage line.   Landry Mellow, RDMS, RVT  07/06/2014, 9:49 AM

## 2014-07-06 NOTE — Telephone Encounter (Signed)
Results of venous duplex negative -note to [place] and [employer]

## 2014-07-09 ENCOUNTER — Encounter: Payer: Self-pay | Admitting: Internal Medicine

## 2014-07-09 ENCOUNTER — Ambulatory Visit (INDEPENDENT_AMBULATORY_CARE_PROVIDER_SITE_OTHER): Payer: BC Managed Care – PPO | Admitting: Internal Medicine

## 2014-07-09 VITALS — BP 102/58 | HR 77 | Ht 72.0 in | Wt 190.0 lb

## 2014-07-09 DIAGNOSIS — R05 Cough: Secondary | ICD-10-CM

## 2014-07-09 DIAGNOSIS — R053 Chronic cough: Secondary | ICD-10-CM

## 2014-07-09 MED ORDER — FLUCONAZOLE 100 MG PO TABS: ORAL_TABLET | ORAL | Status: DC

## 2014-07-09 MED ORDER — FLUTICASONE PROPIONATE 50 MCG/ACT NA SUSP: 2.0000 | Freq: Every day | NASAL | Status: DC

## 2014-07-09 NOTE — Patient Instructions (Addendum)
ICD-9-CM ICD-10-CM   1. Chronic cough 786.2 R05    Cough is from probably from sinus drainage, possible acid reflux, possible adult onset  Asthma All of this is working together to cause cyclical cough/LPR cough Curently no evidence of drug toxicity or pulmonary fibrosis causing cough  #Sinus drainage  -  start nasal steroid generic fluticasone inhaler 2 squirts each nostril daily as advised (nurse will do script)  #Possible Acid Reflux  - continue daily prilosec   - avoid colas, spices, cheeses, spirits, red meats, beer, chocolates, fried foods etc.,   - sleep with head end of bed elevated  - eat small frequent meals  - do not go to bed for 3 hours after last meal  #Possible Asthma  - continue asmanex  -FeNO testing at next visit #Cyclical cough  - please choose 2 days and observe complete voice rest - no talking or whispering  - at all times there  there is urge to cough, drink water or swallow or sip on throat lozenge - refer MR Schinke for cough void rehab - per your discussion will hold off gabapentin for now  #oral thrush fluconazole 200 mg loading dose, followed by 100 mg daily for 7days    #Followup - I will see you in 4-6 weeks.  - any problems call or come sooner - RSI cough score at followup

## 2014-07-09 NOTE — Progress Notes (Signed)
Subjective:    Patient ID: Tyler Chandley., male    DOB: 1950/08/16, 64 y.o.   MRN: [id number] PCP Gennette Pac, MD  HPI  OV 07/09/2014  Chief Complaint  Patient presents with  . Pulmonary Consult    Pt referred by Dr. Hulan Fess for chronic cough x 2 months.     64 year old male accompanied by his wife gives most of the history. He suffers from multiple myeloma since the fall of 2015. Reports a history of insidious onset of chronic cough for the last year and a half. It is gotten progressively worse for the last several months. It is mostly a dry cough. Is not associated with shortness of breath or wheezing or sputum production. It is present during day and night. It is rated as moderate in intensity overall but occasionally severe RSI cough score is 18 and suggest moderate severity of cough but also presents for multifactorial cough suggestive of irritable larynx syndrome.   Cough associated history  - Sinus drainage: He might have postnasal drip but no obvious sinus disease issues  - Allergies: No history of any allergies  - Acid reflux: He does have this and is on proton pump inhibitor and H2 blockade  - Drugs: He was on Cytoxan and Velcade earlier in the year but due to toxicity this was stopped. Subsequently was on Revlimid [this is associated with the 25% incidence of cough and respiratory issues] but the wife and he insists cough preceded Revlimid and also he has been off Revlimid for the last 5 weeks.  - Pulmonary parenchyma:CT chest free of nodules or ILD  (only bony mets due to myeloma). REcently started on asmanex by PCP Gennette Pac, MD and seems to be helping  -ECho Jan 2016: ef 55%  Dr Lorenza Cambridge Reflux Symptom Index (> 13-15 suggestive of LPR cough) 07/09/2014   Hoarseness of problem with voice 0  Clearing  Of Throat 3  Excess throat mucus or feeling of post nasal drip 1  Difficulty swallowing food, liquid or tablets 0  Cough after eating or lying  down 3  Breathing difficulties or choking episodes 0  Troublesome or annoying cough 5  Sensation of something sticking in throat or lump in throat 1  Heartburn, chest pain, indigestion, or stomach acid coming up 5  TOTAL 18       has a past medical history of Diabetes mellitus without complication; GERD (gastroesophageal reflux disease); Cough (07/02/2014); Bilateral calf pain (07/02/2014); Cancer (2004); and Multiple myeloma (02/19/2014).   reports that he quit smoking about 29 years ago. His smoking use included Pipe. He has never used smokeless tobacco.  Past Surgical History  Procedure Laterality Date  . Testicle removal Right 2004  . Sternum biopsy      for dx of multiple myeloma  . Orif ulnar fracture      Allergies  Allergen Reactions  . Velcade [Bortezomib] Other (See Comments)    Fever and chills  . Zometa [Zoledronic Acid] Other (See Comments)    Suspect fever and chills    Immunization History  Administered Date(s) Administered  . Influenza-Unspecified 01/19/2014    Family History  Problem Relation Age of Onset  . Diabetes Father   . Cancer Maternal Uncle     lung cancer  . Cancer Cousin     myeloma     Current outpatient prescriptions:  .  acetaminophen (TYLENOL) 325 MG tablet, Take 2 tablets (650 mg total) by mouth every 6 (six) hours as  needed for mild pain (or Fever >/= 101)., Disp: 30 tablet, Rfl: 0 .  acyclovir (ZOVIRAX) 400 MG tablet, Take 400 mg by mouth daily., Disp: , Rfl:  .  aspirin 81 MG tablet, Take 81 mg by mouth daily., Disp: , Rfl:  .  CALCIUM PO, Take 1,000 mg by mouth daily., Disp: , Rfl:  .  Cholecalciferol (VITAMIN D3) 2000 UNITS TABS, Take 1 tablet by mouth daily. , Disp: , Rfl:  .  famotidine (PEPCID) 40 MG tablet, Take 40 mg by mouth daily as needed for heartburn or indigestion., Disp: , Rfl:  .  folic acid (FOLVITE) 1 MG tablet, Take 1 tablet (1 mg total) by mouth daily., Disp: 30 tablet, Rfl: 2 .  guaiFENesin-codeine (ROBITUSSIN  AC) 100-10 MG/5ML syrup, Take 5 mLs by mouth 3 (three) times daily as needed for cough., Disp: , Rfl:  .  insulin glargine (LANTUS) 100 UNIT/ML injection, Inject 5-6 Units into the skin at bedtime. Take 5 units at Bedtime Everyday Except on Days of Chemo Take 6 units if blood sugar rises, Disp: , Rfl:  .  Magnesium 400 MG TABS, Take 400 mg by mouth daily., Disp: , Rfl:  .  metFORMIN (GLUCOPHAGE) 500 MG tablet, Take 500 mg by mouth 2 (two) times daily with a meal. On days of Chemo only  increase dose to 108m in the evening as needed for rise in blood sugar levels, Disp: , Rfl:  .  mometasone (ASMANEX) 220 MCG/INH inhaler, Inhale 2 puffs into the lungs 2 (two) times daily., Disp: , Rfl:  .  Multiple Vitamin (MULTIVITAMIN) tablet, Take 1 tablet by mouth daily., Disp: , Rfl:  .  omeprazole (PRILOSEC) 20 MG capsule, Take 20 mg by mouth daily., Disp: , Rfl:  .  oxyCODONE (OXY IR/ROXICODONE) 5 MG immediate release tablet, Take 1 tablet (5 mg total) by mouth every 6 (six) hours as needed for severe pain., Disp: 30 tablet, Rfl: 0 .  pravastatin (PRAVACHOL) 40 MG tablet, Take 40 mg by mouth daily. Daily, Disp: , Rfl: 1 .  saccharomyces boulardii (FLORASTOR) 250 MG capsule, Take 1 capsule (250 mg total) by mouth 2 (two) times daily., Disp: 60 capsule, Rfl: 2 .  lenalidomide (REVLIMID) 10 MG capsule, Take 1 capsule (10 mg total) by mouth daily. (Patient not taking: Reported on 07/02/2014), Disp: 21 capsule, Rfl: 0    Review of Systems  Constitutional: Negative for fever and unexpected weight change.  HENT: Positive for congestion. Negative for dental problem, ear pain, nosebleeds, postnasal drip, rhinorrhea, sinus pressure, sneezing, sore throat and trouble swallowing.   Eyes: Negative for redness and itching.  Respiratory: Positive for cough. Negative for chest tightness, shortness of breath and wheezing.   Cardiovascular: Negative for palpitations and leg swelling.  Gastrointestinal: Negative for nausea  and vomiting.  Genitourinary: Negative for dysuria.  Musculoskeletal: Positive for myalgias. Negative for joint swelling.  Skin: Negative for rash.  Neurological: Negative for headaches.  Hematological: Does not bruise/bleed easily.  Psychiatric/Behavioral: Negative for dysphoric mood. The patient is not nervous/anxious.        Objective:   Physical Exam  Constitutional: He is oriented to person, place, and time. He appears well-developed and well-nourished. No distress.  HENT:  Head: Normocephalic and atraumatic.  Right Ear: External ear normal.  Left Ear: External ear normal.  Mouth/Throat: Oropharynx is clear and moist. No oropharyngeal exudate.  Mild oral thrush + Mild post nasal drip +  Eyes: Conjunctivae and EOM are normal. Pupils are equal,  round, and reactive to light. Right eye exhibits no discharge. Left eye exhibits no discharge. No scleral icterus.  Neck: Normal range of motion. Neck supple. No JVD present. No tracheal deviation present. No thyromegaly present.  Cardiovascular: Normal rate, regular rhythm and intact distal pulses.  Exam reveals no gallop and no friction rub.   No murmur heard. Pulmonary/Chest: Effort normal and breath sounds normal. No respiratory distress. He has no wheezes. He has no rales. He exhibits no tenderness.  Abdominal: Soft. Bowel sounds are normal. He exhibits no distension and no mass. There is no tenderness. There is no rebound and no guarding.  Musculoskeletal: Normal range of motion. He exhibits no edema or tenderness.  Lymphadenopathy:    He has no cervical adenopathy.  Neurological: He is alert and oriented to person, place, and time. He has normal reflexes. No cranial nerve deficit. Coordination normal.  Skin: Skin is warm and dry. No rash noted. He is not diaphoretic. No erythema. No pallor.  Psychiatric: He has a normal mood and affect. His behavior is normal. Judgment and thought content normal.  Nursing note and vitals  reviewed.   Filed Vitals:   07/09/14 1140  BP: 102/58  Pulse: 77  Height: 6' (1.829 m)  Weight: 190 lb (86.183 kg)  SpO2: 97%         Assessment & Plan:     ICD-9-CM ICD-10-CM   1. Chronic cough 786.2 R05     Cough is from probably from sinus drainage, possible acid reflux, possible adult onset  Asthma All of this is working together to cause cyclical cough/LPR cough Curently no evidence of drug toxicity or pulmonary fibrosis causing cough  #Sinus drainage  -  start nasal steroid generic fluticasone inhaler 2 squirts each nostril daily as advised (nurse will do script)  #Possible Acid Reflux  - continue daily prilosec   - avoid colas, spices, cheeses, spirits, red meats, beer, chocolates, fried foods etc.,   - sleep with head end of bed elevated  - eat small frequent meals  - do not go to bed for 3 hours after last meal  #Possible Asthma  - continue asmanex  -FeNO testing at next visit   #Cyclical cough  - please choose 2 days and observe complete voice rest - no talking or whispering  - at all times there  there is urge to cough, drink water or swallow or sip on throat lozenge - refer MR Schinke for cough void rehab - per your discussion will hold off gabapentin for now  #oral thrush fluconazole 200 mg loading dose, followed by 100 mg daily for 7days    #Followup - I will see you in 4-6 weeks.  - any problems call or come sooner - RSI cough score at followup - feno test at fu      Dr. Brand Males, M.D., [facility].C.P Pulmonary and Critical Care Medicine Staff Physician [facility] Pager: [phone], If no answer or between  15:00h - 7:00h: call [phone]  07/09/2014 6:20 PM

## 2014-07-11 ENCOUNTER — Ambulatory Visit (INDEPENDENT_AMBULATORY_CARE_PROVIDER_SITE_OTHER): Payer: BC Managed Care – PPO | Admitting: Infectious Disease

## 2014-07-11 ENCOUNTER — Encounter: Payer: Self-pay | Admitting: Infectious Disease

## 2014-07-11 VITALS — BP 108/73 | HR 76 | Temp 98.7°F | Wt 190.0 lb

## 2014-07-11 DIAGNOSIS — E859 Amyloidosis, unspecified: Secondary | ICD-10-CM

## 2014-07-11 DIAGNOSIS — A047 Enterocolitis due to Clostridium difficile: Secondary | ICD-10-CM

## 2014-07-11 DIAGNOSIS — A0472 Enterocolitis due to Clostridium difficile, not specified as recurrent: Secondary | ICD-10-CM

## 2014-07-11 DIAGNOSIS — C9000 Multiple myeloma not having achieved remission: Secondary | ICD-10-CM

## 2014-07-11 DIAGNOSIS — R509 Fever, unspecified: Secondary | ICD-10-CM | POA: Diagnosis not present

## 2014-07-11 DIAGNOSIS — A0471 Enterocolitis due to Clostridium difficile, recurrent: Secondary | ICD-10-CM

## 2014-07-11 DIAGNOSIS — D801 Nonfamilial hypogammaglobulinemia: Secondary | ICD-10-CM

## 2014-07-11 NOTE — Progress Notes (Signed)
 Subjective:    Patient ID: Tyler Hudgins Jr., male    DOB: 05/22/1950, 64 y.o.   MRN: [id number]   Reason for consult: recurrent fevers with chemotherapy  Requesting physician: Dr. Gorsuch  HPI  64 year old with multiple myeloma, amyloidosis, hypogammaglobulinemia who has been hospitalized with regularity following chemotherapy for his multiple myeloma. On first few occasions thought to have Pneumonia but several times since diagnosed with obvious clostridium difficile colitis, most recently diagnosed by + PCR though his oncologist, Dr. Gorsuch was skeptical of diagnosis.  Chemotherapeutic regimen has been changed with no improvement. He has at times avoided PPI--though back on them now and has been on probiotics  He was referred to us to assist in workup of his recurrent fevers. He has had CT chest and abdomen that have failed to show cause for fevers beyond his known multiple myeloma and questionable residual findings from CDI  Prior to getting chemotherapy he has NOT been someone who succumbed to recurrent infections and I have NO reason to believe that there is something wrong with his immune system when he is not on chemotherapy.  [place], has been to [place], [place], but no recent travel.   Review of Systems  Constitutional: Negative for fever, chills, diaphoresis, activity change, appetite change, fatigue and unexpected weight change.  HENT: Negative for congestion, rhinorrhea, sinus pressure, sneezing, sore throat and trouble swallowing.   Eyes: Negative for photophobia and visual disturbance.  Respiratory: Negative for cough, chest tightness, shortness of breath, wheezing and stridor.   Cardiovascular: Negative for chest pain, palpitations and leg swelling.  Gastrointestinal: Negative for nausea, vomiting, abdominal pain, diarrhea, constipation, blood in stool, abdominal distention and anal  bleeding.  Genitourinary: Negative for dysuria, hematuria, flank pain and difficulty urinating.  Musculoskeletal: Negative for myalgias, back pain, joint swelling, arthralgias and gait problem.  Skin: Negative for color change, pallor, rash and wound.  Neurological: Negative for dizziness, tremors, weakness and light-headedness.  Hematological: Negative for adenopathy. Bruises/bleeds easily.  Psychiatric/Behavioral: Negative for behavioral problems, confusion, sleep disturbance, dysphoric mood, decreased concentration and agitation.       Objective:   Physical Exam  Constitutional: He is oriented to person, place, and time. He appears well-developed and well-nourished.  HENT:  Head: Normocephalic and atraumatic.  Eyes: Conjunctivae and EOM are normal.  Neck: Normal range of motion. Neck supple.  Cardiovascular: Normal rate, regular rhythm and normal heart sounds.  Exam reveals no gallop and no friction rub.   No murmur heard. Pulmonary/Chest: Effort normal and breath sounds normal. No respiratory distress. He has no wheezes.  Abdominal: Soft. Bowel sounds are normal. He exhibits no distension. There is no tenderness. There is no rebound.  Musculoskeletal: Normal range of motion. He exhibits no edema or tenderness.  Neurological: He is alert and oriented to person, place, and time.  Skin: Skin is warm and dry. No rash noted. No erythema. No pallor.  Psychiatric: He has a normal mood and affect. His behavior is normal. Judgment and thought content normal.          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 Certainly the test cannot distinguish between colonization and infection BY ITSELF but IF he is having more than 3-5 BM per day with fever in setting of chemotherapy and PPI then certainly recurrent CDI would be high on the list.    Unfortunately probiotics are not very efficacious and in him I would worry about him  becoming fungemic while on probiotics and getting chemotherapy  AVOIDING PPI will be helpful  One could consider a stool transplant from CDiff PCR negative donor and I can discuss this with Dr. Snider who works with Dr. Gessner if it is clear that pts recurrent fevers are coming from recurrent CDI. Now efficacy of a stool transplant while MUCH MUCH better than with antibiotic rx is not fool proof if pt is to be yet again exposed to broad spectrum abx, chemotherapy and PPI  Certainly IF he gets CDI again he needs a vancomycin pulse and taper approach to try to eradicate it.  I have not done this before(and not aware of prophylactic vancomycin for CDI) but one also might consider starting him on oral vancomycin leading into chemotherapy so that if he got chemotherapy and the fevers are really due to recurrent CDI the vancomycin could immediately start going to work. Flagyl would NOT work as since it can itself cause CDI  For starters I would have him stop his PPI and avoid Fluoroquinolones, clindamycin  Making sure his hypogammaglobulinemia is corrected with immunoglobulin also a very good idea since antibody to CDI is also an issue in those who get recurrent infection.  I will discuss further with my partner Dr. Snider and will also discuss with Dr. Gorsuch  We will send off labs for ID causes of fever such as HIV, hep viruses and for CTD though they would not explain his apparently predictable fevers with chemotherapy  Again I see no reason to investigate neutrophil burst test etc in this pt with such a clear cut pattern of fever after chemotherapy  WE also need to be involved with this pt every time he is admitted to help out more in real time  I spent greater than 60 minutes with the patient including greater than 50% of time in face to face counsel of the patient and in coordination of their care.   

## 2014-07-12 ENCOUNTER — Telehealth: Payer: Self-pay | Admitting: Hematology and Oncology

## 2014-07-12 ENCOUNTER — Telehealth: Payer: Self-pay | Admitting: *Deleted

## 2014-07-12 LAB — HIV ANTIBODY (ROUTINE TESTING W REFLEX): HIV: NONREACTIVE

## 2014-07-12 LAB — EPSTEIN-BARR VIRUS VCA ANTIBODY PANEL
EBV EA IgG: <5 U/mL (ref <9.0)
EBV NA IgG: 106 U/mL — ABNORMAL HIGH (ref <18.0)
EBV VCA IgG: 289 U/mL — ABNORMAL HIGH (ref <18.0)
EBV VCA IgM: <10 U/mL (ref <36.0)

## 2014-07-12 LAB — ANA: Anti Nuclear Antibody(ANA): NEGATIVE

## 2014-07-12 LAB — ANGIOTENSIN CONVERTING ENZYME: Angiotensin-Converting Enzyme: 21 U/L (ref 8–52)

## 2014-07-12 LAB — EPSTEIN-BARR VIRUS EARLY D ANTIGEN ANTIBODY, IGG: EBV EA IgG: <5 U/mL (ref <9.0)

## 2014-07-12 LAB — C-REACTIVE PROTEIN: CRP: <0.5 mg/dL (ref <0.60)

## 2014-07-12 LAB — HEPATITIS PANEL, ACUTE
HCV Ab: NEGATIVE
HEP B C IGM: NONREACTIVE
HEP B S AG: NEGATIVE
Hep A IgM: NONREACTIVE

## 2014-07-12 LAB — CK: Total CK: 73 U/L (ref 7–232)

## 2014-07-12 LAB — EPSTEIN-BARR VIRUS NUCLEAR ANTIGEN ANTIBODY, IGG: EBV NA IgG: 106 U/mL — ABNORMAL HIGH (ref <18.0)

## 2014-07-12 LAB — RHEUMATOID FACTOR

## 2014-07-12 LAB — SEDIMENTATION RATE: Sed Rate: 7 mm/hr (ref 0–20)

## 2014-07-12 NOTE — Telephone Encounter (Signed)
s.w. pt and advised on 4.27 appt @ 1:30.Marland Kitchen..pt ok and aware

## 2014-07-12 NOTE — Telephone Encounter (Signed)
Pt already notified by Scheduler.

## 2014-07-12 NOTE — Telephone Encounter (Signed)
-----   Message from Heath Lark, MD sent at 07/12/2014  8:49 AM EDT ----- Regarding: review treatment options Can you call patient to see if he can come in Wednesday next week at 130 pm to review everything?

## 2014-07-13 LAB — QUANTIFERON TB GOLD ASSAY (BLOOD)
INTERFERON GAMMA RELEASE ASSAY: NEGATIVE
Quantiferon Nil Value: 0.04 IU/mL
Quantiferon Tb Ag Minus Nil Value: 0 IU/mL
TB Ag value: 0.04 IU/mL

## 2014-07-13 LAB — CMV IGM: CMV IgM: <8 AU/mL (ref <30.00)

## 2014-07-13 LAB — CYTOMEGALOVIRUS ANTIBODY, IGG: Cytomegalovirus Ab-IgG: >10 U/mL — ABNORMAL HIGH (ref <0.60)

## 2014-07-16 LAB — CRYOGLOBULIN

## 2014-07-18 ENCOUNTER — Encounter: Payer: Self-pay | Admitting: Hematology and Oncology

## 2014-07-18 ENCOUNTER — Ambulatory Visit (HOSPITAL_BASED_OUTPATIENT_CLINIC_OR_DEPARTMENT_OTHER): Payer: BC Managed Care – PPO | Admitting: Hematology and Oncology

## 2014-07-18 VITALS — BP 131/70 | HR 74 | Temp 98.1°F | Resp 19 | Ht 72.0 in | Wt 191.8 lb

## 2014-07-18 DIAGNOSIS — C9000 Multiple myeloma not having achieved remission: Secondary | ICD-10-CM

## 2014-07-18 DIAGNOSIS — D801 Nonfamilial hypogammaglobulinemia: Secondary | ICD-10-CM

## 2014-07-18 DIAGNOSIS — M79661 Pain in right lower leg: Secondary | ICD-10-CM | POA: Diagnosis not present

## 2014-07-18 DIAGNOSIS — M79662 Pain in left lower leg: Secondary | ICD-10-CM

## 2014-07-18 NOTE — Progress Notes (Signed)
[facility] OFFICE PROGRESS NOTE  Patient Care Team: Hulan Fess, MD as PCP - General (Family Medicine) Heath Lark, MD as Consulting Physician (Hematology and Oncology) Truman Hayward, MD as Consulting Physician (Infectious Diseases)  SUMMARY OF ONCOLOGIC HISTORY: Oncology History   ISS Staging II, M-spike 1.7, IgG 2420, kappa 30.1, beta80mcroglobulin 4.48, Creatinine 1.2, calcium 9.8, hemoglobin 13.2, albumin 4.2     Multiple myeloma   01/10/2003 Pathology Results [id number] pathology showed pure seminoma with lymphovascular invasion, pT2 NX MX   01/10/2003 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 congo red staing reveals amyloid deposition within some of the amorphous material and within blood vessels. Normal cytogenetics.   02/14/2014 Imaging PET CT scan showed diffuse skeletal involvement   02/26/2014 - 04/25/2014 Chemotherapy He is started on chemotherapy with Velcade, Cytoxan and dexamethasone. Cytoxan was discontinued due to recurrent admission and recent clostridium Diff infection.  Velcade was suspected to be a cause of fever that was discontinued.   03/09/2014 - 03/11/2014 Hospital Admission He was admitted to the hospital with fevers and chills and was diagnosed with pneumonia   03/22/2014 - 03/26/2014 Hospital Admission He is admitted to the hospital again for fever, chills and possible pneumonia   04/19/2014 - 04/22/2014 Hospital Admission He is admitted to the hospital for fever, chills and diarrhea and was subsequently  found to have C. difficile infection   05/04/2014 - 05/05/2014 Hospital Admission The patient was admitted to the hospital again with fever and chills. No source of infection was found and he was not given antibiotics.   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   06/04/2014 - 06/14/2014 Chemotherapy He was restarted back on Revlimid with dexamethasone only. Treatment was put on hold due to recurrent fever of unknown origin    INTERVAL HISTORY: Please see below for problem oriented charting. I saw the patient and his wife today to review treatment recommendation. Since the last time I saw him, he denies further fever episodes. He continues to have intermittent right, pain that comes and goes. Ultrasound venous Doppler excluded DVT. He denies recent febrile illness, cough or diarrhea.  REVIEW OF SYSTEMS:   Constitutional: Denies fevers, chills or abnormal weight loss Eyes: Denies blurriness of vision Ears, nose, mouth, throat, and face: Denies mucositis or sore throat Respiratory: Denies cough, dyspnea or wheezes Cardiovascular: Denies palpitation, chest discomfort or lower extremity swelling Gastrointestinal:  Denies nausea, heartburn or change in bowel habits Skin: Denies abnormal skin rashes Lymphatics: Denies new lymphadenopathy or easy bruising Neurological:Denies numbness, tingling or new weaknesses Behavioral/Psych: Mood is stable, no new changes  All other systems were reviewed with the patient and are negative.  I have reviewed the past medical history, past surgical history, social history and family history with the patient and they are unchanged from previous note.  ALLERGIES:  is allergic to velcade and zometa.  MEDICATIONS:  Current Outpatient Prescriptions  Medication Sig Dispense Refill  . acetaminophen (TYLENOL) 325 MG  tablet Take 2 tablets  (650 mg total) by mouth every 6 (six) hours as needed for mild pain (or Fever >/= 101). 30 tablet 0  . acyclovir (ZOVIRAX) 400 MG tablet Take 400 mg by mouth daily.    Marland Kitchen aspirin 81 MG tablet Take 81 mg by mouth daily.    Marland Kitchen CALCIUM PO Take 1,000 mg by mouth daily.    . Cholecalciferol (VITAMIN D3) 2000 UNITS TABS Take 1 tablet by mouth daily.     . famotidine (PEPCID) 40 MG tablet Take 40 mg by mouth daily as needed for heartburn or indigestion.    . fluconazole (DIFLUCAN) 100 MG tablet Take 2 tablets 1st day, then 1 tablet daily 9 tablet 0  . fluticasone (FLONASE) 50 MCG/ACT nasal spray Place 2 sprays into both nostrils daily. 16 g 2  . folic acid (FOLVITE) 1 MG tablet Take 1 tablet (1 mg total) by mouth daily. 30 tablet 2  . guaiFENesin-codeine (ROBITUSSIN AC) 100-10 MG/5ML syrup Take 5 mLs by mouth 3 (three) times daily as needed for cough.    . insulin glargine (LANTUS) 100 UNIT/ML injection Inject 5-6 Units into the skin at bedtime. Take 5 units at Bedtime Everyday Except on Days of Chemo Take 6 units if blood sugar rises    . lenalidomide (REVLIMID) 10 MG capsule Take 1 capsule (10 mg total) by mouth daily. 21 capsule 0  . Magnesium 400 MG TABS Take 400 mg by mouth daily.    . metFORMIN (GLUCOPHAGE) 500 MG tablet Take 500 mg by mouth 2 (two) times daily with a meal. On days of Chemo only  increase dose to 1032m in the evening as needed for rise in blood sugar levels    . mometasone (ASMANEX) 220 MCG/INH inhaler Inhale 2 puffs into the lungs 2 (two) times daily.    . Multiple Vitamin (MULTIVITAMIN) tablet Take 1 tablet by mouth daily.    .Marland Kitchenomeprazole (PRILOSEC) 20 MG capsule Take 20 mg by mouth daily.    .Marland KitchenoxyCODONE (OXY IR/ROXICODONE) 5 MG immediate release tablet Take 1 tablet (5 mg total) by mouth every 6 (six) hours as needed for severe pain. 30 tablet 0  . pravastatin (PRAVACHOL) 40 MG tablet Take 40 mg by mouth daily. Daily  1  . saccharomyces boulardii (FLORASTOR) 250 MG capsule Take 1  capsule (250 mg total) by mouth 2 (two) times daily. 60 capsule 2   No current facility-administered medications for this visit.    PHYSICAL EXAMINATION: ECOG PERFORMANCE STATUS: 0 - Asymptomatic  Filed Vitals:   07/18/14 1349  BP: 131/70  Pulse: 74  Temp: 98.1 F (36.7 C)  Resp: 19   Filed Weights   07/18/14 1349  Weight: 191 lb 12.8 oz (87 kg)    GENERAL:alert, no distress and comfortable SKIN: skin color, texture, turgor are normal, no rashes or significant lesions EYES: normal, Conjunctiva are pink and non-injected, sclera clear OROPHARYNX:no exudate, no erythema and lips, buccal mucosa, and tongue normal  NECK: supple, thyroid normal size, non-tender, without nodularity LYMPH:  no palpable lymphadenopathy in the cervical, axillary or inguinal LUNGS: clear to auscultation and percussion with normal breathing effort HEART: regular rate & rhythm and no murmurs and no lower extremity edema ABDOMEN:abdomen soft, non-tender and normal bowel sounds Musculoskeletal:no cyanosis of digits and no clubbing  NEURO: alert & oriented x 3 with fluent speech, no focal motor/sensory deficits  LABORATORY DATA:  I have reviewed the data as listed    Component Value Date/Time  NA 142 07/02/2014 1101   NA 139 05/27/2014 1000   K 3.6 07/02/2014 1101   K 3.3* 05/27/2014 1000   CL 107 05/27/2014 1000   CO2 29 07/02/2014 1101   CO2 27 05/27/2014 1000   GLUCOSE 106 07/02/2014 1101   GLUCOSE 127* 05/27/2014 1000   BUN 12.4 07/02/2014 1101   BUN 12 05/27/2014 1000   CREATININE 1.1 07/02/2014 1101   CREATININE 1.10 05/27/2014 1000   CALCIUM 9.6 07/02/2014 1101   CALCIUM 8.6 05/27/2014 1000   PROT 7.5 07/02/2014 1101   PROT 7.9 05/27/2014 1000   ALBUMIN 4.0 07/02/2014 1101   ALBUMIN 3.7 05/27/2014 1000   AST 26 07/02/2014 1101   AST 37 05/27/2014 1000   ALT 24 07/02/2014 1101   ALT 32 05/27/2014 1000   ALKPHOS 56 07/02/2014 1101   ALKPHOS 47 05/27/2014 1000   BILITOT 0.63  07/02/2014 1101   BILITOT 0.5 05/27/2014 1000   GFRNONAA 70* 05/27/2014 1000   GFRAA 81* 05/27/2014 1000    No results found for: SPEP, UPEP  Lab Results  Component Value Date   WBC 5.5 07/02/2014   NEUTROABS 2.4 07/02/2014   HGB 12.7* 07/02/2014   HCT 38.5 07/02/2014   MCV 91.5 07/02/2014   PLT 220 07/02/2014      Chemistry      Component Value Date/Time   NA 142 07/02/2014 1101   NA 139 05/27/2014 1000   K 3.6 07/02/2014 1101   K 3.3* 05/27/2014 1000   CL 107 05/27/2014 1000   CO2 29 07/02/2014 1101   CO2 27 05/27/2014 1000   BUN 12.4 07/02/2014 1101   BUN 12 05/27/2014 1000   CREATININE 1.1 07/02/2014 1101   CREATININE 1.10 05/27/2014 1000      Component Value Date/Time   CALCIUM 9.6 07/02/2014 1101   CALCIUM 8.6 05/27/2014 1000   ALKPHOS 56 07/02/2014 1101   ALKPHOS 47 05/27/2014 1000   AST 26 07/02/2014 1101   AST 37 05/27/2014 1000   ALT 24 07/02/2014 1101   ALT 32 05/27/2014 1000   BILITOT 0.63 07/02/2014 1101   BILITOT 0.5 05/27/2014 1000     ASSESSMENT & PLAN:  Multiple myeloma This is a very challenging case. I review all the recommendation from pulmonologist and infectious disease. The pulmonologist wants the patient to take anti-acids because reflux could exacerbate some of his cough and shortness of breath. On the other hand, infectious disease consultant wants him to avoid proton pump inhibitor due to association with risk of recurrent Clostridium Difficile infection. He also recommended IVIG replacement due to acquired hypogammaglobulinemia. I told the patient and his wife that previously, I gave him 1 dose of IVIG at 1 g/kg on 05/21/2014 and that did not prevent another febrile illness/hospitalization. His insurance also declined to pay for it I will reattempt to submit another insurance preauthorization to see if we can increase the dose of IVIG to 1 g/kg 2 days to be given the week prior to restarting him back on chemotherapy. I have reached  out to his Bone Marrow Transplant physician at [facility] and discussed his case and we agreed for second opinion within the myeloma group to see what other suggestions they may have to treat this patient. I reviewed his most recent serum protein electrophoresis results which showed that his disease so far is stable despite stopping treatment recently.   Bilateral calf pain He has mild intermittent calf pain. Recent ultrasound excluded DVT. I reassured him and his  wife that this could be muscular spasm.   Hypogammaglobulinemia As mentioned above, the patient have recurrent febrile illness which I think is related to his immunocompromised state and I felt IVIG may be helpful. I gave him 1 dose of IVIG on 05/21/2014 at 1 g/kg. I will submit insurance preauthorization to try 1 g/kg 2 days to be given 1 week before chemotherapy in the future to see if this would help.    No orders of the defined types were placed in this encounter.   All questions were answered. The patient knows to call the clinic with any problems, questions or concerns. No barriers to learning was detected. I spent 25 minutes counseling the patient face to face. The total time spent in the appointment was 30 minutes and more than 50% was on counseling and review of test results     [facility], [place], MD 07/18/2014 4:38 PM

## 2014-07-18 NOTE — Assessment & Plan Note (Signed)
As mentioned above, the patient have recurrent febrile illness which I think is related to his immunocompromised state and I felt IVIG may be helpful. I gave him 1 dose of IVIG on 05/21/2014 at 1 g/kg. I will submit insurance preauthorization to try 1 g/kg 2 days to be given 1 week before chemotherapy in the future to see if this would help.

## 2014-07-18 NOTE — Assessment & Plan Note (Signed)
This is a very challenging case. I review all the recommendation from pulmonologist and infectious disease. The pulmonologist wants the patient to take anti-acids because reflux could exacerbate some of his cough and shortness of breath. On the other hand, infectious disease consultant wants him to avoid proton pump inhibitor due to association with risk of recurrent Clostridium Difficile infection. He also recommended IVIG replacement due to acquired hypogammaglobulinemia. I told the patient and his wife that previously, I gave him 1 dose of IVIG at 1 g/kg on 05/21/2014 and that did not prevent another febrile illness/hospitalization. His insurance also declined to pay for it I will reattempt to submit another insurance preauthorization to see if we can increase the dose of IVIG to 1 g/kg 2 days to be given the week prior to restarting him back on chemotherapy. I have reached out to his Bone Marrow Transplant physician at [facility] and discussed his case and we agreed for second opinion within the myeloma group to see what other suggestions they may have to treat this patient. I reviewed his most recent serum protein electrophoresis results which showed that his disease so far is stable despite stopping treatment recently.

## 2014-07-18 NOTE — Assessment & Plan Note (Signed)
He has mild intermittent calf pain. Recent ultrasound excluded DVT. I reassured him and his wife that this could be muscular spasm.

## 2014-07-19 ENCOUNTER — Telehealth: Payer: Self-pay | Admitting: *Deleted

## 2014-07-19 NOTE — Telephone Encounter (Signed)
Faxed office notes to Dr Harvel Ricks at [facility]

## 2014-07-24 ENCOUNTER — Telehealth: Payer: Self-pay

## 2014-07-24 NOTE — Telephone Encounter (Signed)
Pt lvm that Dr Sherri Sear at [facility] wants [place] to fax his records. Dr Alvy Bimler referred pt to [facility]. Routed to medical records.

## 2014-07-25 ENCOUNTER — Ambulatory Visit (INDEPENDENT_AMBULATORY_CARE_PROVIDER_SITE_OTHER): Payer: BC Managed Care – PPO | Admitting: Infectious Disease

## 2014-07-25 ENCOUNTER — Telehealth: Payer: Self-pay | Admitting: Hematology and Oncology

## 2014-07-25 ENCOUNTER — Encounter: Payer: Self-pay | Admitting: Infectious Disease

## 2014-07-25 VITALS — BP 118/75 | HR 72 | Temp 98.1°F | Wt 192.1 lb

## 2014-07-25 DIAGNOSIS — A047 Enterocolitis due to Clostridium difficile: Secondary | ICD-10-CM

## 2014-07-25 DIAGNOSIS — C9000 Multiple myeloma not having achieved remission: Secondary | ICD-10-CM

## 2014-07-25 DIAGNOSIS — R509 Fever, unspecified: Secondary | ICD-10-CM

## 2014-07-25 DIAGNOSIS — D801 Nonfamilial hypogammaglobulinemia: Secondary | ICD-10-CM

## 2014-07-25 DIAGNOSIS — E859 Amyloidosis, unspecified: Secondary | ICD-10-CM | POA: Diagnosis not present

## 2014-07-25 DIAGNOSIS — A0472 Enterocolitis due to Clostridium difficile, not specified as recurrent: Secondary | ICD-10-CM

## 2014-07-25 DIAGNOSIS — K219 Gastro-esophageal reflux disease without esophagitis: Secondary | ICD-10-CM

## 2014-07-25 NOTE — Progress Notes (Signed)
Subjective:    Patient ID: Tyler Willis., male    DOB: 1951/01/10, 64 y.o.   MRN: [id number]    HPI   64 year old with multiple myeloma, amyloidosis, hypogammaglobulinemia who has been hospitalized with regularity following chemotherapy for his multiple myeloma. On first few occasions thought to have Pneumonia but several times since diagnosed with obvious clostridium difficile colitis, most recently diagnosed by + PCR though his oncologist, Dr. Alvy Bimler was skeptical of diagnosis.  Chemotherapeutic regimen has been changed with no improvement. He has at times avoided PPI--though back on them now and has been on probiotics  He was referred to [place] to assist in workup of his recurrent fevers. He has had CT chest and abdomen that have failed to show cause for fevers beyond his known multiple myeloma and questionable residual findings from CDI  Prior to getting chemotherapy he has NOT been someone who succumbed to recurrent infections and I have NO reason to believe that there is something wrong with his immune system when he is not on chemotherapy.  He has lived largely on [place] and travelled throughout the [place] but not [place], has been to [place], [place], but no recent travel.   We did further workup for additional serologies including HIV negative, viral hepatitis, EBV and CMV that showed findings c/w prior infection.  SInce NOT getting chemotherapy he has not had fevers or other symptoms.   He has been referred to [facility] and  Dr. Harvel Ricks for further chemotherapy and he is to be given Veldex with avoidance of cytoxan and other myelosuppressive  therapy that might cause febrile neutropenia.   While CCM, Pulmonary would like to have pt continue on PP given his hiatal hernia and concern for GERD though I would like him to avoid them to minimize the risk of CDI.     Review of Systems  Constitutional: Negative for fever, chills, diaphoresis, activity change, appetite  change, fatigue and unexpected weight change.  HENT: Negative for congestion, rhinorrhea, sinus pressure, sneezing, sore throat and trouble swallowing.   Eyes: Negative for photophobia and visual disturbance.  Respiratory: Negative for cough, chest tightness, shortness of breath, wheezing and stridor.   Cardiovascular: Negative for chest pain, palpitations and leg swelling.  Gastrointestinal: Negative for nausea, vomiting, abdominal pain, diarrhea, constipation, blood in stool, abdominal distention and anal bleeding.  Genitourinary: Negative for dysuria, hematuria, flank pain and difficulty urinating.  Musculoskeletal: Negative for myalgias, back pain, joint swelling, arthralgias and gait problem.  Skin: Negative for color change, pallor, rash and wound.  Neurological: Negative for dizziness, tremors, weakness and light-headedness.  Hematological: Negative for adenopathy. Bruises/bleeds easily.  Psychiatric/Behavioral: Negative for behavioral problems, confusion, sleep disturbance, dysphoric mood, decreased concentration and agitation.       Objective:   Physical Exam  Constitutional: He is oriented to person, place, and time. He appears well-developed and well-nourished.  HENT:  Head: Normocephalic and atraumatic.  Eyes: Conjunctivae and EOM are normal.  Neck: Normal range of motion. Neck supple.  Cardiovascular: Normal rate, regular rhythm and normal heart sounds.  Exam reveals no gallop and no friction rub.   No murmur heard. Pulmonary/Chest: Effort normal and breath sounds normal. No respiratory distress. He has no wheezes.  Abdominal: Soft. Bowel sounds are normal. He exhibits no distension. There is no tenderness. There is no rebound.  Musculoskeletal: Normal range of motion. He exhibits no edema or tenderness.  Neurological: He is alert and oriented to person, place, and time.  Skin: Skin is warm and dry. No rash noted. No erythema. No pallor.  Psychiatric: He has a normal mood  and affect. His behavior is normal. Judgment and thought content normal.          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 Certainly the test cannot distinguish between colonization and infection BY ITSELF but IF he is having more than 3-5 BM per day with fever in setting of chemotherapy and PPI then certainly recurrent CDI would be high on the list.  Agree with [facility] in avoiding myelosuppressive therapy if possible  I discussed case with Dr. Alvy Bimler and we discussed use of IVIG because he has had hypogammaglobulinemia (that has been difficult to distinguish in background of his myeloma) and would be supportive of IVIG but has been very difficult to obtain this   Plan for now is for him to proceed with cheomtherapy and IF he fevers again to be evaluated by [place], most expediently as an inpatient but we could also try to get expedited outpatient management, inpatient workup may be easier  Avoid unnecessary abx esp high risk abx such as FQ, clindamycin, 2nd and 3rd gen ceph  I spent greater than 25 minutes with the patient including greater than 50% of time in face to face counsel of the patient regarding all of the labs we had done and in plan for further workupand in coordination of their care.   GERD: can also try teaspoonful of mustard daily rather than PPI or H2 blocker  #3 Multiple myeloma and amyloidosis: to start veldex

## 2014-07-25 NOTE — Telephone Encounter (Signed)
Patient was see on on 05/04 by Dr. Harvel Ricks @ [facility]

## 2014-07-30 ENCOUNTER — Other Ambulatory Visit: Payer: Self-pay | Admitting: Nurse Practitioner

## 2014-08-14 ENCOUNTER — Ambulatory Visit: Payer: BC Managed Care – PPO | Admitting: Internal Medicine

## 2014-09-05 ENCOUNTER — Other Ambulatory Visit: Payer: Self-pay | Admitting: Hematology and Oncology

## 2014-09-17 ENCOUNTER — Other Ambulatory Visit: Payer: Self-pay

## 2014-10-03 ENCOUNTER — Telehealth: Payer: Self-pay | Admitting: *Deleted

## 2014-10-03 NOTE — Telephone Encounter (Signed)
TC from patient stating that his handicap tag expires the end of this month and it needs to be re-newed. Needs form filled out by Dr. Alvy Bimler. Call patient when available.

## 2014-10-03 NOTE — Telephone Encounter (Signed)
Notified patient that form is ready for him to pick up

## 2014-10-04 ENCOUNTER — Other Ambulatory Visit: Payer: Self-pay | Admitting: Hematology and Oncology

## 2014-11-04 ENCOUNTER — Other Ambulatory Visit: Payer: Self-pay | Admitting: Hematology and Oncology

## 2014-11-18 ENCOUNTER — Other Ambulatory Visit: Payer: Self-pay | Admitting: Hematology and Oncology

## 2014-11-22 ENCOUNTER — Other Ambulatory Visit: Payer: Self-pay | Admitting: Hematology and Oncology

## 2014-12-14 ENCOUNTER — Encounter: Payer: Self-pay | Admitting: *Deleted

## 2014-12-14 NOTE — Progress Notes (Signed)
Note received from BMT at [facility] w/ schedule.  Pt starts testing for Autologous stem cell transplant today on 9/23.  Transplant day scheduled for 01/15/15.

## 2015-01-05 ENCOUNTER — Emergency Department (HOSPITAL_BASED_OUTPATIENT_CLINIC_OR_DEPARTMENT_OTHER)
Admission: EM | Admit: 2015-01-05 | Discharge: 2015-01-05 | Disposition: A | Discharge status: Home / Self Care | Payer: BC Managed Care – PPO | Attending: Physician Assistant | Admitting: Physician Assistant

## 2015-01-05 ENCOUNTER — Encounter (HOSPITAL_BASED_OUTPATIENT_CLINIC_OR_DEPARTMENT_OTHER): Payer: Self-pay | Admitting: Emergency Medicine

## 2015-01-05 DIAGNOSIS — Z87891 Personal history of nicotine dependence: Secondary | ICD-10-CM | POA: Insufficient documentation

## 2015-01-05 DIAGNOSIS — Z794 Long term (current) use of insulin: Secondary | ICD-10-CM | POA: Diagnosis not present

## 2015-01-05 DIAGNOSIS — K219 Gastro-esophageal reflux disease without esophagitis: Secondary | ICD-10-CM | POA: Insufficient documentation

## 2015-01-05 DIAGNOSIS — E119 Type 2 diabetes mellitus without complications: Secondary | ICD-10-CM | POA: Diagnosis not present

## 2015-01-05 DIAGNOSIS — Z79899 Other long term (current) drug therapy: Secondary | ICD-10-CM | POA: Diagnosis not present

## 2015-01-05 DIAGNOSIS — Z4801 Encounter for change or removal of surgical wound dressing: Secondary | ICD-10-CM | POA: Insufficient documentation

## 2015-01-05 DIAGNOSIS — Z7982 Long term (current) use of aspirin: Secondary | ICD-10-CM | POA: Diagnosis not present

## 2015-01-05 DIAGNOSIS — Z8579 Personal history of other malignant neoplasms of lymphoid, hematopoietic and related tissues: Secondary | ICD-10-CM | POA: Insufficient documentation

## 2015-01-05 DIAGNOSIS — Z8547 Personal history of malignant neoplasm of testis: Secondary | ICD-10-CM | POA: Insufficient documentation

## 2015-01-05 DIAGNOSIS — Z5189 Encounter for other specified aftercare: Secondary | ICD-10-CM

## 2015-01-05 NOTE — Discharge Instructions (Signed)
Please return if the bleeding worsens, Tegaderm gets loose.

## 2015-01-05 NOTE — ED Provider Notes (Signed)
CSN: [id number]     Arrival date & time 01/05/15  6168 History   First MD Initiated Contact with Patient 01/05/15 [phone]     Chief Complaint  Patient presents with  . Vascular Access Problem     (Consider location/radiation/quality/duration/timing/severity/associated sxs/prior Treatment) HPI   Patient had triple lumen placement OR at [facility]. Here because wife is worried about bleeding from the site. Patient had no fevers, no pain around the site.  Past Medical History  Diagnosis Date  . Diabetes mellitus without complication ([place])   . GERD (gastroesophageal reflux disease)   . Cough 07/02/2014  . Bilateral calf pain 07/02/2014  . 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 Multiple myeloma ([place]) 02/19/2014  . Multiple myeloma [facility])    Past Surgical History  Procedure Laterality Date  . Testicle removal Right 2004  . Sternum biopsy      for dx of multiple myeloma  . Orif ulnar fracture     Family History  Problem Relation Age of Onset  . Diabetes Father   . Cancer Maternal Uncle     lung cancer  . Cancer Cousin     myeloma   Social History  Substance Use Topics  . Smoking status: Former Smoker    Types: Pipe    Quit date: 05/24/1985  . Smokeless tobacco: Never Used  . Alcohol Use: No    Review of Systems  Constitutional: Negative for activity change.  Respiratory: Negative for shortness of breath.   Cardiovascular: Negative for chest pain.  Gastrointestinal: Negative for abdominal pain.      Allergies  Velcade and Zometa  Home Medications   Prior to Admission medications   Medication Sig Start Date End Date Taking? Authorizing Provider  CALCIUM PO Take 1,000 mg by mouth daily.   Yes Historical Provider, MD  Cholecalciferol (VITAMIN D3) 2000 UNITS TABS Take 1 tablet by mouth daily.    Yes Historical Provider, MD  famotidine (PEPCID) 40 MG tablet Take 40 mg by mouth daily as needed for heartburn or indigestion.   Yes Historical Provider, MD  insulin glargine  (LANTUS) 100 UNIT/ML injection Inject 5-6 Units into the skin at bedtime. Take 5 units at Bedtime Everyday Except on Days of Chemo Take 6 units if blood sugar rises   Yes Historical Provider, MD  Magnesium 400 MG TABS Take 400 mg by mouth daily.   Yes Historical Provider, MD  metFORMIN (GLUCOPHAGE) 500 MG tablet Take 500 mg by mouth 2 (two) times daily with a meal. On days of Chemo only  increase dose to 1035m in the evening as needed for rise in blood sugar levels   Yes Historical Provider, MD  mometasone (ASMANEX) 220 MCG/INH inhaler Inhale 2 puffs into the lungs 2 (two) times daily.   Yes Historical Provider, MD  Multiple Vitamin (MULTIVITAMIN) tablet Take 1 tablet by mouth daily.   Yes Historical Provider, MD  omeprazole (PRILOSEC) 20 MG capsule Take 20 mg by mouth daily.   Yes Historical Provider, MD  pravastatin (PRAVACHOL) 40 MG tablet Take 40 mg by mouth daily. Daily 11/20/13  Yes Historical Provider, MD  acyclovir (ZOVIRAX) 400 MG tablet Take 400 mg by mouth daily.    Historical Provider, MD  aspirin 81 MG tablet Take 81 mg by mouth daily.    Historical Provider, MD  folic acid (FOLVITE) 1 MG tablet TAKE 1 TABLET BY MOUTH EVERY DAY 10/04/14   NHeath Lark MD  lenalidomide (REVLIMID) 10 MG capsule Take 1 capsule (  10 mg total) by mouth daily. 06/11/14   Heath Lark, MD  oxyCODONE (OXY IR/ROXICODONE) 5 MG immediate release tablet Take 1 tablet (5 mg total) by mouth every 6 (six) hours as needed for severe pain. 04/26/14   Ni Gorsuch, MD   BP 140/76 mmHg  Pulse 84  Resp 18  Ht 6' (1.829 m)  Wt 194 lb (87.998 kg)  BMI 26.31 kg/m2  SpO2 96% Physical Exam  Constitutional: He is oriented to person, place, and time. He appears well-nourished.  HENT:  Head: Normocephalic.  Eyes: Conjunctivae are normal.  Cardiovascular: Normal rate.   Pulmonary/Chest: Effort normal.  Triple-lumen and right upper chest wall. Scant amount of dried blood.  Neurological: He is oriented to person, place, and time.   Skin: Skin is warm and dry. He is not diaphoretic.  Psychiatric: He has a normal mood and affect. His behavior is normal.    ED Course  Procedures (including critical care time) Labs Review Labs Reviewed - No data to display  Imaging Review No results found. I have personally reviewed and evaluated these images and lab results as part of my medical decision-making.   EKG Interpretation None      MDM   Final diagnoses:  None    Patient is 64 year old male who had triple lumen in place in the OR for stemcell treatment. Here about concern for the site of placement. There is scant amount of dried blood. Tegaderm still intact on  top of the site. I think the risk is higher of infection to replace this then to leave it as is. We'll have patient follow up as needed.  Dorethea Strubel Julio Alm, MD 01/05/15 1001

## 2015-01-05 NOTE — ED Notes (Signed)
Pt had triple lumen port placed yesterday at [facility], this am awoke with small amount of bleeding under dressing

## 2015-01-25 ENCOUNTER — Telehealth: Payer: Self-pay | Admitting: Hematology and Oncology

## 2015-01-25 ENCOUNTER — Other Ambulatory Visit: Payer: Self-pay | Admitting: Hematology and Oncology

## 2015-01-25 DIAGNOSIS — C9001 Multiple myeloma in remission: Secondary | ICD-10-CM

## 2015-01-25 NOTE — Telephone Encounter (Signed)
Called again and was to reach patient and gave him weekly appointments for 11/7 thru 11/28. Per patient was currently still in the hosp at [facility] and may go home Sunday but it's still iffy. Patient informed that this appointments may have possibly been scheduled at the request of [facility] and transferred to leave message for desk nurse re his current status at [facility].

## 2015-01-25 NOTE — Telephone Encounter (Signed)
Not able to reach patient or leave message on primary phone number. Left message for patient on home phone re appointments for 11/7, 11/14, 11/21 and 11/28. Patient also mychart active and informed he could view appointments on mychart.

## 2015-01-28 ENCOUNTER — Ambulatory Visit: Payer: BC Managed Care – PPO | Admitting: Hematology and Oncology

## 2015-01-28 ENCOUNTER — Other Ambulatory Visit: Payer: BC Managed Care – PPO

## 2015-01-28 ENCOUNTER — Other Ambulatory Visit: Payer: Self-pay | Admitting: Hematology and Oncology

## 2015-01-31 ENCOUNTER — Ambulatory Visit (HOSPITAL_COMMUNITY)
Admission: RE | Admit: 2015-01-31 | Discharge: 2015-01-31 | Disposition: A | Discharge status: Home / Self Care | Payer: BC Managed Care – PPO | Source: Ambulatory Visit | Attending: Hematology and Oncology | Admitting: Hematology and Oncology

## 2015-02-01 ENCOUNTER — Other Ambulatory Visit: Payer: Self-pay | Admitting: Hematology and Oncology

## 2015-02-04 ENCOUNTER — Encounter: Payer: Self-pay | Admitting: Hematology and Oncology

## 2015-02-04 ENCOUNTER — Other Ambulatory Visit (HOSPITAL_BASED_OUTPATIENT_CLINIC_OR_DEPARTMENT_OTHER): Payer: BC Managed Care – PPO

## 2015-02-04 ENCOUNTER — Telehealth: Payer: Self-pay | Admitting: Hematology and Oncology

## 2015-02-04 ENCOUNTER — Ambulatory Visit (HOSPITAL_BASED_OUTPATIENT_CLINIC_OR_DEPARTMENT_OTHER): Payer: BC Managed Care – PPO | Admitting: Hematology and Oncology

## 2015-02-04 VITALS — BP 119/70 | HR 105 | Temp 98.1°F | Resp 18 | Ht 72.0 in | Wt 188.0 lb

## 2015-02-04 DIAGNOSIS — C9001 Multiple myeloma in remission: Secondary | ICD-10-CM | POA: Diagnosis not present

## 2015-02-04 DIAGNOSIS — E853 Secondary systemic amyloidosis: Secondary | ICD-10-CM

## 2015-02-04 DIAGNOSIS — E858 Other amyloidosis: Secondary | ICD-10-CM

## 2015-02-04 DIAGNOSIS — D630 Anemia in neoplastic disease: Secondary | ICD-10-CM

## 2015-02-04 DIAGNOSIS — Z9481 Bone marrow transplant status: Secondary | ICD-10-CM | POA: Diagnosis not present

## 2015-02-04 LAB — COMPREHENSIVE METABOLIC PANEL (CC13)
ALT: 24 U/L (ref 0–55)
ANION GAP: 10 meq/L (ref 3–11)
AST: 27 U/L (ref 5–34)
Albumin: 3.5 g/dL (ref 3.5–5.0)
Alkaline Phosphatase: 65 U/L (ref 40–150)
BILIRUBIN TOTAL: 0.48 mg/dL (ref 0.20–1.20)
BUN: 9.8 mg/dL (ref 7.0–26.0)
CALCIUM: 9.9 mg/dL (ref 8.4–10.4)
CHLORIDE: 108 meq/L (ref 98–109)
CO2: 23 meq/L (ref 22–29)
Creatinine: 1.2 mg/dL (ref 0.7–1.3)
EGFR: 67 mL/min/{1.73_m2} — AB (ref >90)
Glucose: 174 mg/dl — ABNORMAL HIGH (ref 70–140)
Potassium: 4.2 mEq/L (ref 3.5–5.1)
Sodium: 141 mEq/L (ref 136–145)
Total Protein: 7.1 g/dL (ref 6.4–8.3)

## 2015-02-04 LAB — CBC WITH DIFFERENTIAL/PLATELET
BASO%: 1.6 % (ref 0.0–2.0)
BASOS ABS: 0.1 10*3/uL (ref 0.0–0.1)
EOS%: 0.3 % (ref 0.0–7.0)
Eosinophils Absolute: 0 10*3/uL (ref 0.0–0.5)
HEMATOCRIT: 33.5 % — AB (ref 38.4–49.9)
HGB: 11 g/dL — ABNORMAL LOW (ref 13.0–17.1)
LYMPH#: 1.9 10*3/uL (ref 0.9–3.3)
LYMPH%: 26.8 % (ref 14.0–49.0)
MCH: 30.7 pg (ref 27.2–33.4)
MCHC: 32.8 g/dL (ref 32.0–36.0)
MCV: 93.7 fL (ref 79.3–98.0)
MONO#: 1.2 10*3/uL — AB (ref 0.1–0.9)
MONO%: 16.7 % — ABNORMAL HIGH (ref 0.0–14.0)
NEUT#: 3.8 10*3/uL (ref 1.5–6.5)
NEUT%: 54.6 % (ref 39.0–75.0)
PLATELETS: 434 10*3/uL — AB (ref 140–400)
RBC: 3.58 10*6/uL — AB (ref 4.20–5.82)
RDW: 18.3 % — ABNORMAL HIGH (ref 11.0–14.6)
WBC: 7 10*3/uL (ref 4.0–10.3)

## 2015-02-04 LAB — TECHNOLOGIST REVIEW

## 2015-02-04 LAB — MAGNESIUM (CC13): MAGNESIUM: 2.3 mg/dL (ref 1.5–2.5)

## 2015-02-04 LAB — HOLD TUBE, BLOOD BANK

## 2015-02-04 NOTE — Telephone Encounter (Signed)
Gave and printed appt sched and avs fo rpt; for NOV  °

## 2015-02-04 NOTE — Assessment & Plan Note (Signed)
This is likely anemia of chronic disease. The patient denies recent history of bleeding such as epistaxis, hematuria or hematochezia. He is asymptomatic from the anemia. We will observe for now.  He does not require transfusion now.   

## 2015-02-04 NOTE — Assessment & Plan Note (Signed)
He has recovered fully from recent neutropenic fever. The patient will return next week to [facility] for further care. He has appointment for blood draw here on 02/18/2015. He does not require any transfusion support now and is fully engrafted. He will continue on antimicrobial prophylaxis. I will defer treatment decision to his transplant physician at [place] regarding the start date of maintenance treatment.

## 2015-02-04 NOTE — Assessment & Plan Note (Signed)
He has no signs and symptoms of congestive heart failure.

## 2015-02-04 NOTE — Assessment & Plan Note (Signed)
He is on neutropenic precaution. He is taking antimicrobial prophylaxis. He will be due for post transplant vaccination in the near future at [place].

## 2015-02-04 NOTE — Progress Notes (Signed)
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 congo red staing reveals amyloid deposition within some of the amorphous material and within blood vessels. Normal cytogenetics.   02/14/2014 Imaging PET CT scan showed diffuse skeletal involvement   02/26/2014 - 04/25/2014 Chemotherapy He is started on chemotherapy with Velcade, Cytoxan and dexamethasone. Cytoxan was discontinued due to recurrent admission and recent clostridium Diff infection.  Velcade was suspected to be a cause of fever that was discontinued.   03/09/2014 - 03/11/2014 Hospital Admission He was admitted to the hospital with fevers and chills and was diagnosed with pneumonia   03/22/2014 - 03/26/2014 Hospital Admission He is admitted to the hospital again for fever, chills and possible pneumonia   04/19/2014 - 04/22/2014 Hospital  Admission He is admitted to the hospital for fever, chills and diarrhea and was subsequently found to have C. difficile infection   05/04/2014 - 05/05/2014 Hospital Admission The patient was admitted to the hospital again with fever and chills. No source of infection was found and he was not given antibiotics.   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   06/04/2014 - 06/14/2014 Chemotherapy He was restarted back on Revlimid with dexamethasone only. Treatment was put on hold due to recurrent fever of unknown origin   01/14/2015 - 01/14/2015 Chemotherapy He received high dose melphalan   01/15/2015 Bone Marrow Transplant He received autologous BMT   01/22/2015 - 01/27/2015 Hospital Admission He was admitted to [facility] for neutropenic fever & was found to have E Coli bacteremia    INTERVAL HISTORY: Please see below for problem oriented charting. He returns today for post transplant follow-up. He has recovered fully from recent neutropenic fever. He has lost a little bit overweight. He denies recent fevers or chills.  REVIEW OF SYSTEMS:   Constitutional: Denies fevers, chills or abnormal weight loss Eyes: Denies blurriness of vision Ears, nose, mouth, throat, and face: Denies mucositis or sore throat Respiratory: Denies cough, dyspnea or wheezes Cardiovascular: Denies palpitation, chest discomfort or lower extremity swelling Gastrointestinal:  Denies nausea, heartburn or change in bowel habits Skin: Denies abnormal skin rashes Lymphatics: Denies new lymphadenopathy or easy bruising Neurological:Denies numbness, tingling or new weaknesses Behavioral/Psych: Mood is stable, no new changes  All other systems were reviewed with the patient and are negative.  I have reviewed the past medical history, past surgical history, social history and family history   with the  patient and they are unchanged from previous note.  ALLERGIES:  is allergic to velcade and zometa.  MEDICATIONS:  Current Outpatient Prescriptions  Medication Sig Dispense Refill  . acyclovir (ZOVIRAX) 400 MG tablet Take 400 mg by mouth daily.    . CALCIUM PO Take 1,000 mg by mouth daily.    . Cholecalciferol (VITAMIN D3) 2000 UNITS TABS Take 1 tablet by mouth daily.     . folic acid (FOLVITE) 1 MG tablet TAKE 1 TABLET BY MOUTH EVERY DAY 30 tablet 0  . insulin glargine (LANTUS) 100 UNIT/ML injection Inject 5-6 Units into the skin at bedtime. Take 5 units at Bedtime Everyday Except on Days of Chemo Take 6 units if blood sugar rises    . loperamide (IMODIUM) 2 MG capsule Take 2 mg by mouth.    . metFORMIN (GLUCOPHAGE) 500 MG tablet Take 500 mg by mouth 2 (two) times daily with a meal. On days of Chemo only  increase dose to 1000mg in the evening as needed for rise in blood sugar levels    . Multiple Vitamin (MULTIVITAMIN) tablet Take 1 tablet by mouth daily.    . pantoprazole (PROTONIX) 40 MG tablet Take 40 mg by mouth.    . potassium chloride (MICRO-K) 10 MEQ CR capsule Take 20 mEq by mouth.    . pravastatin (PRAVACHOL) 40 MG tablet Take 40 mg by mouth daily. Daily  1  . prochlorperazine (COMPAZINE) 10 MG tablet Take 10 mg by mouth.    . Magnesium 400 MG TABS Take 400 mg by mouth daily.    . ondansetron (ZOFRAN) 8 MG tablet Take 8 mg by mouth.    . oxyCODONE (OXY IR/ROXICODONE) 5 MG immediate release tablet Take 1 tablet (5 mg total) by mouth every 6 (six) hours as needed for severe pain. (Patient not taking: Reported on 02/04/2015) 30 tablet 0  . [START ON 02/15/2015] sulfamethoxazole-trimethoprim (BACTRIM DS,SEPTRA DS) 800-160 MG tablet Take 1 tablet by mouth.     No current facility-administered medications for this visit.    PHYSICAL EXAMINATION: ECOG PERFORMANCE STATUS: 0 - Asymptomatic  Filed Vitals:   02/04/15 1017  BP: 119/70  Pulse: 105  Temp: 98.1 F (36.7 C)  Resp: 18    Filed Weights   02/04/15 1017  Weight: 188 lb (85.276 kg)    GENERAL:alert, no distress and comfortable SKIN: skin color, texture, turgor are normal, no rashes or significant lesions EYES: normal, Conjunctiva are pink and non-injected, sclera clear OROPHARYNX:no exudate, no erythema and lips, buccal mucosa, and tongue normal  NECK: supple, thyroid normal size, non-tender, without nodularity LYMPH:  no palpable lymphadenopathy in the cervical, axillary or inguinal LUNGS: clear to auscultation and percussion with normal breathing effort HEART: regular rate & rhythm and no murmurs and no lower extremity edema ABDOMEN:abdomen soft, non-tender and normal bowel sounds Musculoskeletal:no cyanosis of digits and no clubbing  NEURO: alert & oriented x 3 with fluent speech, no focal motor/sensory deficits  LABORATORY DATA:  I have reviewed the data as listed    Component Value Date/Time   NA 141 02/04/2015 1003   NA 139 05/27/2014 1000   K 4.2 02/04/2015 1003   K 3.3* 05/27/2014 1000   CL 107 05/27/2014 1000   CO2 23 02/04/2015 1003   CO2 27 05/27/2014 1000   GLUCOSE 174* 02/04/2015 1003   GLUCOSE 127* 05/27/2014 1000   BUN 9.8 02/04/2015 1003   BUN 12 05/27/2014 1000   CREATININE 1.2 02/04/2015 1003     CREATININE 1.10 05/27/2014 1000   CALCIUM 9.9 02/04/2015 1003   CALCIUM 8.6 05/27/2014 1000   PROT 7.1 02/04/2015 1003   PROT 7.9 05/27/2014 1000   ALBUMIN 3.5 02/04/2015 1003   ALBUMIN 3.7 05/27/2014 1000   AST 27 02/04/2015 1003   AST 37 05/27/2014 1000   ALT 24 02/04/2015 1003   ALT 32 05/27/2014 1000   ALKPHOS 65 02/04/2015 1003   ALKPHOS 47 05/27/2014 1000   BILITOT 0.48 02/04/2015 1003   BILITOT 0.5 05/27/2014 1000   GFRNONAA 70* 05/27/2014 1000   GFRAA 81* 05/27/2014 1000    No results found for: SPEP, UPEP  Lab Results  Component Value Date   WBC 7.0 02/04/2015   NEUTROABS 3.8 02/04/2015   HGB 11.0* 02/04/2015   HCT 33.5* 02/04/2015   MCV 93.7 02/04/2015    PLT 434* 02/04/2015      Chemistry      Component Value Date/Time   NA 141 02/04/2015 1003   NA 139 05/27/2014 1000   K 4.2 02/04/2015 1003   K 3.3* 05/27/2014 1000   CL 107 05/27/2014 1000   CO2 23 02/04/2015 1003   CO2 27 05/27/2014 1000   BUN 9.8 02/04/2015 1003   BUN 12 05/27/2014 1000   CREATININE 1.2 02/04/2015 1003   CREATININE 1.10 05/27/2014 1000      Component Value Date/Time   CALCIUM 9.9 02/04/2015 1003   CALCIUM 8.6 05/27/2014 1000   ALKPHOS 65 02/04/2015 1003   ALKPHOS 47 05/27/2014 1000   AST 27 02/04/2015 1003   AST 37 05/27/2014 1000   ALT 24 02/04/2015 1003   ALT 32 05/27/2014 1000   BILITOT 0.48 02/04/2015 1003   BILITOT 0.5 05/27/2014 1000     ASSESSMENT & PLAN:  Multiple myeloma in remission (HCC) He has recovered fully from recent neutropenic fever. The patient will return next week to [facility] for further care. He has appointment for blood draw here on 02/18/2015. He does not require any transfusion support now and is fully engrafted. He will continue on antimicrobial prophylaxis. I will defer treatment decision to his transplant physician at [facility] regarding the start date of maintenance treatment.  Secondary amyloidosis (HCC)  He has no signs and symptoms of congestive heart failure.  Anemia in neoplastic disease This is likely anemia of chronic disease. The patient denies recent history of bleeding such as epistaxis, hematuria or hematochezia. He is asymptomatic from the anemia. We will observe for now.  He does not require transfusion now.    S/P bone marrow transplant (HCC) He is on neutropenic precaution. He is taking antimicrobial prophylaxis. He will be due for post transplant vaccination in the near future at [facility].   No orders of the defined types were placed in this encounter.   All questions were answered. The patient knows to call the clinic with any problems, questions or concerns. No barriers to learning was  detected. I spent 20 minutes counseling the patient face to face. The total time spent in the appointment was 25 minutes and more than 50% was on counseling and review of test results     GORSUCH, NI, MD 02/04/2015 10:51 AM    

## 2015-02-11 ENCOUNTER — Other Ambulatory Visit: Payer: BC Managed Care – PPO

## 2015-02-18 ENCOUNTER — Telehealth: Payer: Self-pay | Admitting: Hematology and Oncology

## 2015-02-18 ENCOUNTER — Telehealth: Payer: Self-pay | Admitting: *Deleted

## 2015-02-18 ENCOUNTER — Other Ambulatory Visit (HOSPITAL_BASED_OUTPATIENT_CLINIC_OR_DEPARTMENT_OTHER): Payer: BC Managed Care – PPO

## 2015-02-18 ENCOUNTER — Other Ambulatory Visit: Payer: Self-pay | Admitting: Hematology and Oncology

## 2015-02-18 DIAGNOSIS — C9001 Multiple myeloma in remission: Secondary | ICD-10-CM

## 2015-02-18 LAB — CBC WITH DIFFERENTIAL/PLATELET
BASO%: 1.2 % (ref 0.0–2.0)
Basophils Absolute: 0.1 10*3/uL (ref 0.0–0.1)
EOS%: 10.1 % — ABNORMAL HIGH (ref 0.0–7.0)
Eosinophils Absolute: 0.8 10*3/uL — ABNORMAL HIGH (ref 0.0–0.5)
HEMATOCRIT: 36.8 % — AB (ref 38.4–49.9)
HEMOGLOBIN: 12.2 g/dL — AB (ref 13.0–17.1)
LYMPH#: 2.1 10*3/uL (ref 0.9–3.3)
LYMPH%: 27.1 % (ref 14.0–49.0)
MCH: 31 pg (ref 27.2–33.4)
MCHC: 33.1 g/dL (ref 32.0–36.0)
MCV: 93.4 fL (ref 79.3–98.0)
MONO#: 1 10*3/uL — AB (ref 0.1–0.9)
MONO%: 13.3 % (ref 0.0–14.0)
NEUT%: 48.3 % (ref 39.0–75.0)
NEUTROS ABS: 3.7 10*3/uL (ref 1.5–6.5)
PLATELETS: 207 10*3/uL (ref 140–400)
RBC: 3.94 10*6/uL — ABNORMAL LOW (ref 4.20–5.82)
RDW: 16.8 % — ABNORMAL HIGH (ref 11.0–14.6)
WBC: 7.6 10*3/uL (ref 4.0–10.3)

## 2015-02-18 LAB — COMPREHENSIVE METABOLIC PANEL (CC13)
ALBUMIN: 3.7 g/dL (ref 3.5–5.0)
ALT: 31 U/L (ref 0–55)
AST: 29 U/L (ref 5–34)
Alkaline Phosphatase: 66 U/L (ref 40–150)
Anion Gap: 10 mEq/L (ref 3–11)
BILIRUBIN TOTAL: 0.66 mg/dL (ref 0.20–1.20)
BUN: 9.4 mg/dL (ref 7.0–26.0)
CALCIUM: 9.5 mg/dL (ref 8.4–10.4)
CO2: 22 mEq/L (ref 22–29)
Chloride: 107 mEq/L (ref 98–109)
Creatinine: 1.3 mg/dL (ref 0.7–1.3)
EGFR: 59 mL/min/{1.73_m2} — AB (ref >90)
Glucose: 176 mg/dl — ABNORMAL HIGH (ref 70–140)
POTASSIUM: 4 meq/L (ref 3.5–5.1)
Sodium: 138 mEq/L (ref 136–145)
TOTAL PROTEIN: 7.3 g/dL (ref 6.4–8.3)

## 2015-02-18 NOTE — Telephone Encounter (Signed)
per pof to sch pt apppt-gave pt copy of avs-sent MW emailt o sch trmt-pt to get update copy on MY CHART-req no calls

## 2015-02-18 NOTE — Telephone Encounter (Signed)
Per staff message and POF I have scheduled appts. Advised scheduler of appts. JMW  

## 2015-02-25 ENCOUNTER — Telehealth: Payer: Self-pay | Admitting: Hematology and Oncology

## 2015-02-25 NOTE — Telephone Encounter (Signed)
MOVED 12/8 F/U TO 1:30 PM AND ADJUSTED ASSOCIATED APPOINTMENTS DUE TO NG MEETING. SPOKE WITH PATIENT RE CHANGE AND NEW TIME FOR 12/8 @ 1 PM.

## 2015-02-28 ENCOUNTER — Other Ambulatory Visit (HOSPITAL_BASED_OUTPATIENT_CLINIC_OR_DEPARTMENT_OTHER): Payer: BC Managed Care – PPO

## 2015-02-28 ENCOUNTER — Ambulatory Visit (HOSPITAL_BASED_OUTPATIENT_CLINIC_OR_DEPARTMENT_OTHER): Payer: BC Managed Care – PPO

## 2015-02-28 ENCOUNTER — Ambulatory Visit (HOSPITAL_BASED_OUTPATIENT_CLINIC_OR_DEPARTMENT_OTHER): Payer: BC Managed Care – PPO | Admitting: Hematology and Oncology

## 2015-02-28 ENCOUNTER — Telehealth: Payer: Self-pay | Admitting: Hematology and Oncology

## 2015-02-28 ENCOUNTER — Encounter: Payer: Self-pay | Admitting: Hematology and Oncology

## 2015-02-28 VITALS — BP 112/63 | HR 76 | Temp 98.1°F | Resp 18 | Ht 72.0 in | Wt 190.6 lb

## 2015-02-28 DIAGNOSIS — C9001 Multiple myeloma in remission: Secondary | ICD-10-CM

## 2015-02-28 DIAGNOSIS — D638 Anemia in other chronic diseases classified elsewhere: Secondary | ICD-10-CM | POA: Diagnosis not present

## 2015-02-28 DIAGNOSIS — Z9481 Bone marrow transplant status: Secondary | ICD-10-CM | POA: Diagnosis not present

## 2015-02-28 LAB — COMPREHENSIVE METABOLIC PANEL
ALBUMIN: 3.9 g/dL (ref 3.5–5.0)
ALK PHOS: 67 U/L (ref 40–150)
ALT: 38 U/L (ref 0–55)
AST: 36 U/L — ABNORMAL HIGH (ref 5–34)
Anion Gap: 11 mEq/L (ref 3–11)
BUN: 11.2 mg/dL (ref 7.0–26.0)
CALCIUM: 9.6 mg/dL (ref 8.4–10.4)
CO2: 21 mEq/L — ABNORMAL LOW (ref 22–29)
Chloride: 109 mEq/L (ref 98–109)
Creatinine: 1.2 mg/dL (ref 0.7–1.3)
EGFR: 62 mL/min/{1.73_m2} — AB (ref >90)
GLUCOSE: 96 mg/dL (ref 70–140)
POTASSIUM: 4.3 meq/L (ref 3.5–5.1)
SODIUM: 141 meq/L (ref 136–145)
Total Bilirubin: 0.81 mg/dL (ref 0.20–1.20)
Total Protein: 7.3 g/dL (ref 6.4–8.3)

## 2015-02-28 LAB — CBC WITH DIFFERENTIAL/PLATELET
BASO%: 0.8 % (ref 0.0–2.0)
BASOS ABS: 0.1 10*3/uL (ref 0.0–0.1)
EOS ABS: 0.6 10*3/uL — AB (ref 0.0–0.5)
EOS%: 6.4 % (ref 0.0–7.0)
HCT: 37.8 % — ABNORMAL LOW (ref 38.4–49.9)
HEMOGLOBIN: 12.4 g/dL — AB (ref 13.0–17.1)
LYMPH%: 32.4 % (ref 14.0–49.0)
MCH: 30.7 pg (ref 27.2–33.4)
MCHC: 32.8 g/dL (ref 32.0–36.0)
MCV: 93.7 fL (ref 79.3–98.0)
MONO#: 1.2 10*3/uL — ABNORMAL HIGH (ref 0.1–0.9)
MONO%: 13.6 % (ref 0.0–14.0)
NEUT#: 4.3 10*3/uL (ref 1.5–6.5)
NEUT%: 46.8 % (ref 39.0–75.0)
Platelets: 195 10*3/uL (ref 140–400)
RBC: 4.03 10*6/uL — ABNORMAL LOW (ref 4.20–5.82)
RDW: 16.2 % — AB (ref 11.0–14.6)
WBC: 9.1 10*3/uL (ref 4.0–10.3)
lymph#: 3 10*3/uL (ref 0.9–3.3)

## 2015-02-28 MED ORDER — LENALIDOMIDE 10 MG PO CAPS: 10.0000 mg | ORAL_CAPSULE | Freq: Every day | ORAL | Status: DC

## 2015-02-28 MED ORDER — ZOLEDRONIC ACID 4 MG/100ML IV SOLN
4.0000 mg | Freq: Once | INTRAVENOUS | Status: DC
  Filled 2015-02-28: qty 100

## 2015-02-28 MED ORDER — SODIUM CHLORIDE 0.9 % IV SOLN
Freq: Once | INTRAVENOUS | Status: AC
  Administered 2015-02-28: 14:00:00 via INTRAVENOUS

## 2015-02-28 MED ORDER — ZOLEDRONIC ACID 4 MG/100ML IV SOLN
4.0000 mg | Freq: Once | INTRAVENOUS | Status: AC
  Administered 2015-02-28: 4 mg via INTRAVENOUS
  Filled 2015-02-28: qty 100

## 2015-02-28 NOTE — Patient Instructions (Signed)

## 2015-02-28 NOTE — Progress Notes (Signed)
Clarified orders with [place] RN, Dr. Calton Dach nurse. Pt is to receive Zometa only today.  Pt tolerated treatment well. Pt in stable condition at time of discharge.

## 2015-02-28 NOTE — Telephone Encounter (Signed)
Gave and printed appt sched and avs for pt for Jan and Feb °

## 2015-03-01 ENCOUNTER — Telehealth: Payer: Self-pay | Admitting: *Deleted

## 2015-03-01 NOTE — Assessment & Plan Note (Signed)
He is on neutropenic precaution. He is taking antimicrobial prophylaxis. He will be due for post transplant vaccination in the near future at [facility]. The patient is still quite debilitated from recent transplant. I gave him a renewal of disability parking permit for 6 more months

## 2015-03-01 NOTE — Assessment & Plan Note (Signed)
This is likely anemia of chronic disease. The patient denies recent history of bleeding such as epistaxis, hematuria or hematochezia. He is asymptomatic from the anemia. We will observe for now.  He does not require transfusion now.   

## 2015-03-01 NOTE — Assessment & Plan Note (Signed)
I reviewed the recommendations from [facility]. He will return next month for further staging information to confirm remission status. Per recommendation, I will schedule him to receive monthly Zometa for 2 years per guidelines. He has obtained dental clearance recently. He will continue calcium with vitamin D supplements. I will see him in February to review final recommendation regarding the role and starting him on Revlimid post transplant.

## 2015-03-01 NOTE — Telephone Encounter (Signed)
Spoke w/ Transplant Coordinator, Urban Gibson, At [facility].   Asked when pt should be started on maintenance Revlimid and if they want Dr. Alvy Bimler to  prescribe?   She says pt scheduled for tests on 04/17/15 and has appt w/ MD on 04/23/15.  Wells Guiles requests we wait to order Revlimid until after 1/31.  They will let us know when to order and will appreciate Dr. Alvy Bimler ordering/managing the Revlimid.

## 2015-03-01 NOTE — Progress Notes (Signed)
[place] OFFICE PROGRESS NOTE  Patient Care Team: Hulan Fess, MD as PCP - General (Family Medicine) Heath Lark, MD as Consulting Physician (Hematology and Oncology) Truman Hayward, MD as Consulting Physician (Infectious Diseases) Garry Heater, DO as Referring Physician (Hematology)  SUMMARY OF ONCOLOGIC HISTORY: Oncology History   ISS Staging II, M-spike 1.7, IgG 2420, kappa 30.1, beta47mcroglobulin 4.48, Creatinine 1.2, calcium 9.8, hemoglobin 13.2, albumin 4.2     Multiple myeloma in remission ([place]   01/10/2003 Pathology Results [id number] pathology showed pure seminoma with lymphovascular invasion, pT2 NX MX   01/10/2003 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 congo red staing reveals amyloid deposition within some of the amorphous material and within blood vessels. Normal cytogenetics.   02/14/2014 Imaging PET CT scan showed diffuse skeletal involvement   02/26/2014 - 04/25/2014 Chemotherapy He is started on chemotherapy with Velcade, Cytoxan and dexamethasone. Cytoxan was discontinued due to recurrent admission and recent clostridium Diff infection.  Velcade was suspected to be a cause of fever that was discontinued.   03/09/2014 - 03/11/2014 Hospital Admission He was admitted to the hospital with fevers and chills and was diagnosed with pneumonia   03/22/2014 - 03/26/2014 Hospital Admission He is admitted to the hospital again for fever, chills and possible pneumonia   04/19/2014 - 04/22/2014 Hospital  Admission He is admitted to the hospital for fever, chills and diarrhea and was subsequently found to have C. difficile infection   05/04/2014 - 05/05/2014 Hospital Admission The patient was admitted to the hospital again with fever and chills. No source of infection was found and he was not given antibiotics.   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   06/04/2014 - 06/14/2014 Chemotherapy He was restarted back on Revlimid with dexamethasone only. Treatment was put on hold due to recurrent fever of unknown origin   01/14/2015 - 01/14/2015 Chemotherapy He received high dose melphalan   01/15/2015 Bone Marrow Transplant He received autologous BMT   01/22/2015 - 01/27/2015 [facility] was found to have E Coli bacteremia    INTERVAL HISTORY: Please see below for problem oriented charting. He returns for further follow-up. He feels well. Denies recent infection. He continues to have fatigue with minimal exertion. He has his dental visit recently and is cleared to proceed with Zometa.  REVIEW OF SYSTEMS:   Constitutional: Denies fevers, chills or abnormal weight loss Eyes: Denies blurriness of vision Ears, nose, mouth, throat, and face: Denies mucositis or sore throat Respiratory: Denies cough, dyspnea or wheezes Cardiovascular: Denies palpitation, chest discomfort or lower extremity swelling Gastrointestinal:  Denies nausea, heartburn or change in bowel habits Skin: Denies abnormal skin rashes Lymphatics: Denies new lymphadenopathy or easy bruising Neurological:Denies numbness, tingling or new weaknesses Behavioral/Psych: Mood is stable, no new changes  All other systems were reviewed with the patient and are negative.  I have reviewed the past medical history, past surgical history, social  history and  family history with the patient and they are unchanged from previous note.  ALLERGIES:  is allergic to velcade and zometa.  MEDICATIONS:  Current Outpatient Prescriptions  Medication Sig Dispense Refill  . acyclovir (ZOVIRAX) 400 MG tablet Take 400 mg by mouth daily.    Marland Kitchen CALCIUM PO Take 1,000 mg by mouth daily.    . Cholecalciferol (VITAMIN D3) 2000 UNITS TABS Take 1 tablet by mouth daily.     . folic acid (FOLVITE) 1 MG tablet TAKE 1 TABLET BY MOUTH EVERY DAY 30 tablet 0  . insulin glargine (LANTUS) 100 UNIT/ML injection Inject 5-6 Units into the skin at bedtime. Take 5 units at Bedtime Everyday Except on Days of Chemo Take 6 units if blood sugar rises    . metFORMIN (GLUCOPHAGE) 500 MG tablet Take 500 mg by mouth 2 (two) times daily with a meal. On days of Chemo only  increase dose to 1059m in the evening as needed for rise in blood sugar levels    . Multiple Vitamin (MULTIVITAMIN) tablet Take 1 tablet by mouth daily.    . pantoprazole (PROTONIX) 40 MG tablet Take 40 mg by mouth.    . potassium chloride (MICRO-K) 10 MEQ CR capsule Take 10 mEq by mouth 2 (two) times daily.     .Marland Kitchensulfamethoxazole-trimethoprim (BACTRIM DS,SEPTRA DS) 800-160 MG tablet Take 1 tablet by mouth 3 (three) times a week.     . lenalidomide (REVLIMID) 10 MG capsule Take 1 capsule (10 mg total) by mouth daily. 28 capsule 11  . prochlorperazine (COMPAZINE) 10 MG tablet Take 10 mg by mouth.     No current facility-administered medications for this visit.    PHYSICAL EXAMINATION: ECOG PERFORMANCE STATUS: 0 - Asymptomatic  Filed Vitals:   02/28/15 1311  BP: 112/63  Pulse: 76  Temp: 98.1 F (36.7 C)  Resp: 18   Filed Weights   02/28/15 1311  Weight: 190 lb 9.6 oz (86.456 kg)    GENERAL:alert, no distress and comfortable SKIN: skin color, texture, turgor are normal, no rashes or significant lesions EYES: normal, Conjunctiva are pink and non-injected, sclera clear OROPHARYNX:no exudate, no erythema and  lips, buccal mucosa, and tongue normal  NECK: supple, thyroid normal size, non-tender, without nodularity LYMPH:  no palpable lymphadenopathy in the cervical, axillary or inguinal LUNGS: clear to auscultation and percussion with normal breathing effort HEART: regular rate & rhythm and no murmurs and no lower extremity edema ABDOMEN:abdomen soft, non-tender and normal bowel sounds Musculoskeletal:no cyanosis of digits and no clubbing  NEURO: alert & oriented x 3 with fluent speech, no focal motor/sensory deficits  LABORATORY DATA:  I have reviewed the data as listed    Component Value Date/Time   NA 141 02/28/2015 1300   NA 139 05/27/2014 1000   K 4.3 02/28/2015 1300   K 3.3* 05/27/2014 1000   CL 107 05/27/2014 1000   CO2 21* 02/28/2015 1300   CO2 27 05/27/2014 1000   GLUCOSE 96 02/28/2015 1300   GLUCOSE 127* 05/27/2014 1000   BUN 11.2 02/28/2015 1300   BUN 12 05/27/2014 1000   CREATININE 1.2 02/28/2015 1300   CREATININE 1.10 05/27/2014 1000   CALCIUM 9.6 02/28/2015 1300   CALCIUM 8.6 05/27/2014 1000   PROT 7.3 02/28/2015 1300   PROT 7.9 05/27/2014 1000   ALBUMIN 3.9 02/28/2015 1300   ALBUMIN 3.7 05/27/2014 1000   AST 36* 02/28/2015 1300   AST 37 05/27/2014 1000   ALT 38 02/28/2015 1300  ALT 32 05/27/2014 1000   ALKPHOS 67 02/28/2015 1300   ALKPHOS 47 05/27/2014 1000   BILITOT 0.81 02/28/2015 1300   BILITOT 0.5 05/27/2014 1000   GFRNONAA 70* 05/27/2014 1000   GFRAA 81* 05/27/2014 1000    No results found for: SPEP, UPEP  Lab Results  Component Value Date   WBC 9.1 02/28/2015   NEUTROABS 4.3 02/28/2015   HGB 12.4* 02/28/2015   HCT 37.8* 02/28/2015   MCV 93.7 02/28/2015   PLT 195 02/28/2015      Chemistry      Component Value Date/Time   NA 141 02/28/2015 1300   NA 139 05/27/2014 1000   K 4.3 02/28/2015 1300   K 3.3* 05/27/2014 1000   CL 107 05/27/2014 1000   CO2 21* 02/28/2015 1300   CO2 27 05/27/2014 1000   BUN 11.2 02/28/2015 1300   BUN 12  05/27/2014 1000   CREATININE 1.2 02/28/2015 1300   CREATININE 1.10 05/27/2014 1000      Component Value Date/Time   CALCIUM 9.6 02/28/2015 1300   CALCIUM 8.6 05/27/2014 1000   ALKPHOS 67 02/28/2015 1300   ALKPHOS 47 05/27/2014 1000   AST 36* 02/28/2015 1300   AST 37 05/27/2014 1000   ALT 38 02/28/2015 1300   ALT 32 05/27/2014 1000   BILITOT 0.81 02/28/2015 1300   BILITOT 0.5 05/27/2014 1000     ASSESSMENT & PLAN:  Multiple myeloma in remission [facility]) I reviewed the recommendations from [facility]. He will return next month for further staging information to confirm remission status. Per recommendation, I will schedule him to receive monthly Zometa for 2 years per guidelines. He has obtained dental clearance recently. He will continue calcium with vitamin D supplements. I will see him in February to review final recommendation regarding the role and starting him on Revlimid post transplant.  Anemia in chronic illness This is likely anemia of chronic disease. The patient denies recent history of bleeding such as epistaxis, hematuria or hematochezia. He is asymptomatic from the anemia. We will observe for now.  He does not require transfusion now.    S/P bone marrow transplant [facility]) He is on neutropenic precaution. He is taking antimicrobial prophylaxis. He will be due for post transplant vaccination in the near future at [facility]. The patient is still quite debilitated from recent transplant. I gave him a renewal of disability parking permit for 6 more months     No orders of the defined types were placed in this encounter.   All questions were answered. The patient knows to call the clinic with any problems, questions or concerns. No barriers to learning was detected. I spent 20 minutes counseling the patient face to face. The total time spent in the appointment was 25 minutes and more than 50% was on counseling and review of test results     [facility], Icie Kuznicki,  MD 03/01/2015 8:14 AM

## 2015-03-04 LAB — SPEP & IFE WITH QIG
ABNORMAL PROTEIN BAND1: 0.4 g/dL
ALBUMIN ELP: 4 g/dL (ref 3.8–4.8)
ALPHA-1-GLOBULIN: 0.4 g/dL — AB (ref 0.2–0.3)
ALPHA-2-GLOBULIN: 0.9 g/dL (ref 0.5–0.9)
BETA GLOBULIN: 0.4 g/dL (ref 0.4–0.6)
Beta 2: 0.2 g/dL (ref 0.2–0.5)
Gamma Globulin: 1 g/dL (ref 0.8–1.7)
IgA: 57 mg/dL — ABNORMAL LOW (ref 68–379)
IgG (Immunoglobin G), Serum: 1110 mg/dL (ref 650–1600)
IgM, Serum: 21 mg/dL — ABNORMAL LOW (ref 41–251)
TOTAL PROTEIN, SERUM ELECTROPHOR: 6.8 g/dL (ref 6.1–8.1)

## 2015-03-04 LAB — KAPPA/LAMBDA LIGHT CHAINS
Kappa free light chain: 2.66 mg/dL — ABNORMAL HIGH (ref 0.33–1.94)
Kappa:Lambda Ratio: 3.02 — ABNORMAL HIGH (ref 0.26–1.65)
Lambda Free Lght Chn: 0.88 mg/dL (ref 0.57–2.63)

## 2015-04-01 ENCOUNTER — Ambulatory Visit (HOSPITAL_BASED_OUTPATIENT_CLINIC_OR_DEPARTMENT_OTHER): Payer: BC Managed Care – PPO

## 2015-04-01 ENCOUNTER — Other Ambulatory Visit (HOSPITAL_BASED_OUTPATIENT_CLINIC_OR_DEPARTMENT_OTHER): Payer: BC Managed Care – PPO

## 2015-04-01 VITALS — BP 116/64 | HR 75 | Temp 97.9°F | Resp 16

## 2015-04-01 DIAGNOSIS — C9001 Multiple myeloma in remission: Secondary | ICD-10-CM

## 2015-04-01 LAB — CBC WITH DIFFERENTIAL/PLATELET
BASO%: 0.4 % (ref 0.0–2.0)
BASOS ABS: 0 10*3/uL (ref 0.0–0.1)
EOS ABS: 0.4 10*3/uL (ref 0.0–0.5)
EOS%: 3.7 % (ref 0.0–7.0)
HEMATOCRIT: 39.6 % (ref 38.4–49.9)
HEMOGLOBIN: 13.1 g/dL (ref 13.0–17.1)
LYMPH#: 4.4 10*3/uL — AB (ref 0.9–3.3)
LYMPH%: 42.9 % (ref 14.0–49.0)
MCH: 30.3 pg (ref 27.2–33.4)
MCHC: 33 g/dL (ref 32.0–36.0)
MCV: 91.9 fL (ref 79.3–98.0)
MONO#: 1.4 10*3/uL — AB (ref 0.1–0.9)
MONO%: 13.3 % (ref 0.0–14.0)
NEUT#: 4.1 10*3/uL (ref 1.5–6.5)
NEUT%: 39.7 % (ref 39.0–75.0)
PLATELETS: 199 10*3/uL (ref 140–400)
RBC: 4.31 10*6/uL (ref 4.20–5.82)
RDW: 15 % — ABNORMAL HIGH (ref 11.0–14.6)
WBC: 10.3 10*3/uL (ref 4.0–10.3)

## 2015-04-01 LAB — COMPREHENSIVE METABOLIC PANEL
ALBUMIN: 4.2 g/dL (ref 3.5–5.0)
ALK PHOS: 57 U/L (ref 40–150)
ALT: 37 U/L (ref 0–55)
AST: 27 U/L (ref 5–34)
Anion Gap: 8 mEq/L (ref 3–11)
BUN: 16.7 mg/dL (ref 7.0–26.0)
CALCIUM: 9.6 mg/dL (ref 8.4–10.4)
CO2: 26 mEq/L (ref 22–29)
CREATININE: 1.3 mg/dL (ref 0.7–1.3)
Chloride: 105 mEq/L (ref 98–109)
EGFR: 60 mL/min/{1.73_m2} — ABNORMAL LOW (ref >90)
Glucose: 86 mg/dl (ref 70–140)
Potassium: 3.9 mEq/L (ref 3.5–5.1)
Sodium: 138 mEq/L (ref 136–145)
Total Bilirubin: 1.09 mg/dL (ref 0.20–1.20)
Total Protein: 7.1 g/dL (ref 6.4–8.3)

## 2015-04-01 MED ORDER — ZOLEDRONIC ACID 4 MG/100ML IV SOLN
4.0000 mg | Freq: Once | INTRAVENOUS | Status: AC
  Administered 2015-04-01: 4 mg via INTRAVENOUS
  Filled 2015-04-01: qty 100

## 2015-04-01 MED ORDER — SODIUM CHLORIDE 0.9 % IV SOLN
Freq: Once | INTRAVENOUS | Status: AC
  Administered 2015-04-01: 15:00:00 via INTRAVENOUS

## 2015-04-01 NOTE — Patient Instructions (Signed)

## 2015-04-02 ENCOUNTER — Encounter: Payer: Self-pay | Admitting: Hematology and Oncology

## 2015-04-02 ENCOUNTER — Telehealth: Payer: Self-pay | Admitting: *Deleted

## 2015-04-02 NOTE — Telephone Encounter (Signed)
[employer] program at [facility] form signed by Dr. Alvy Bimler.  Informed pt ready to pick up and left at front desk lobby.  He verbalized understanding.

## 2015-04-02 NOTE — Progress Notes (Signed)
I placed form from [facility] for dr. Alvy Bimler to sign.

## 2015-05-02 ENCOUNTER — Ambulatory Visit (HOSPITAL_BASED_OUTPATIENT_CLINIC_OR_DEPARTMENT_OTHER): Payer: BC Managed Care – PPO | Admitting: Hematology and Oncology

## 2015-05-02 ENCOUNTER — Telehealth: Payer: Self-pay | Admitting: Hematology and Oncology

## 2015-05-02 ENCOUNTER — Other Ambulatory Visit (HOSPITAL_BASED_OUTPATIENT_CLINIC_OR_DEPARTMENT_OTHER): Payer: BC Managed Care – PPO

## 2015-05-02 ENCOUNTER — Ambulatory Visit (HOSPITAL_BASED_OUTPATIENT_CLINIC_OR_DEPARTMENT_OTHER): Payer: BC Managed Care – PPO

## 2015-05-02 ENCOUNTER — Encounter: Payer: Self-pay | Admitting: Hematology and Oncology

## 2015-05-02 ENCOUNTER — Telehealth: Payer: Self-pay | Admitting: *Deleted

## 2015-05-02 VITALS — BP 117/78 | HR 84 | Temp 98.4°F | Resp 17 | Ht 72.0 in | Wt 199.8 lb

## 2015-05-02 DIAGNOSIS — Z9481 Bone marrow transplant status: Secondary | ICD-10-CM

## 2015-05-02 DIAGNOSIS — C9001 Multiple myeloma in remission: Secondary | ICD-10-CM | POA: Diagnosis not present

## 2015-05-02 DIAGNOSIS — E859 Amyloidosis, unspecified: Secondary | ICD-10-CM

## 2015-05-02 DIAGNOSIS — E853 Secondary systemic amyloidosis: Secondary | ICD-10-CM

## 2015-05-02 LAB — COMPREHENSIVE METABOLIC PANEL
ALT: 59 U/L — AB (ref 0–55)
AST: 35 U/L — AB (ref 5–34)
Albumin: 4.3 g/dL (ref 3.5–5.0)
Alkaline Phosphatase: 58 U/L (ref 40–150)
Anion Gap: 11 mEq/L (ref 3–11)
BILIRUBIN TOTAL: 1.03 mg/dL (ref 0.20–1.20)
BUN: 21.2 mg/dL (ref 7.0–26.0)
CO2: 21 meq/L — AB (ref 22–29)
CREATININE: 1.4 mg/dL — AB (ref 0.7–1.3)
Calcium: 9.9 mg/dL (ref 8.4–10.4)
Chloride: 106 mEq/L (ref 98–109)
EGFR: 55 mL/min/{1.73_m2} — AB (ref >90)
GLUCOSE: 159 mg/dL — AB (ref 70–140)
Potassium: 4.1 mEq/L (ref 3.5–5.1)
SODIUM: 137 meq/L (ref 136–145)
TOTAL PROTEIN: 7.4 g/dL (ref 6.4–8.3)

## 2015-05-02 LAB — CBC WITH DIFFERENTIAL/PLATELET
BASO%: 0.5 % (ref 0.0–2.0)
Basophils Absolute: 0 10*3/uL (ref 0.0–0.1)
EOS%: 3.2 % (ref 0.0–7.0)
Eosinophils Absolute: 0.2 10*3/uL (ref 0.0–0.5)
HCT: 41.1 % (ref 38.4–49.9)
HGB: 13.7 g/dL (ref 13.0–17.1)
LYMPH%: 35 % (ref 14.0–49.0)
MCH: 30.4 pg (ref 27.2–33.4)
MCHC: 33.4 g/dL (ref 32.0–36.0)
MCV: 91 fL (ref 79.3–98.0)
MONO#: 1.1 10*3/uL — ABNORMAL HIGH (ref 0.1–0.9)
MONO%: 15.1 % — AB (ref 0.0–14.0)
NEUT%: 46.2 % (ref 39.0–75.0)
NEUTROS ABS: 3.5 10*3/uL (ref 1.5–6.5)
Platelets: 212 10*3/uL (ref 140–400)
RBC: 4.52 10*6/uL (ref 4.20–5.82)
RDW: 14.8 % — AB (ref 11.0–14.6)
WBC: 7.6 10*3/uL (ref 4.0–10.3)
lymph#: 2.7 10*3/uL (ref 0.9–3.3)

## 2015-05-02 MED ORDER — SODIUM CHLORIDE 0.9 % IV SOLN
Freq: Once | INTRAVENOUS | Status: AC
  Administered 2015-05-02: 16:00:00 via INTRAVENOUS

## 2015-05-02 MED ORDER — ZOLEDRONIC ACID 4 MG/100ML IV SOLN
4.0000 mg | Freq: Once | INTRAVENOUS | Status: AC
  Administered 2015-05-02: 4 mg via INTRAVENOUS
  Filled 2015-05-02: qty 100

## 2015-05-02 NOTE — Progress Notes (Signed)
[place] OFFICE PROGRESS NOTE  Patient Care Team: Hulan Fess, MD as PCP - General (Family Medicine) Heath Lark, MD as Consulting Physician (Hematology and Oncology) Truman Hayward, MD as Consulting Physician (Infectious Diseases) Garry Heater, DO as Referring Physician (Hematology)  SUMMARY OF ONCOLOGIC HISTORY: Oncology History   ISS Staging II, M-spike 1.7, IgG 2420, kappa 30.1, beta22mcroglobulin 4.48, Creatinine 1.2, calcium 9.8, hemoglobin 13.2, albumin 4.2     [place]   01/10/2003 Pathology Results [id number] pathology showed pure seminoma with lymphovascular invasion, pT2 NX MX   01/10/2003 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 congo red staing reveals amyloid deposition within some of the amorphous material and within blood vessels. Normal cytogenetics.   02/14/2014 Imaging PET CT scan showed diffuse skeletal involvement   02/26/2014 - 04/25/2014 Chemotherapy He is started on chemotherapy with Velcade, Cytoxan and dexamethasone. Cytoxan was discontinued due to recurrent admission and recent clostridium Diff infection.  Velcade was suspected to be a cause of fever that was discontinued.   03/09/2014 - 03/11/2014 Hospital Admission He was admitted to the hospital with fevers and chills and was diagnosed with pneumonia   03/22/2014 - 03/26/2014 Hospital Admission He is admitted to the hospital again for fever, chills and possible pneumonia   04/19/2014 - 04/22/2014 Hospital  Admission He is admitted to the hospital for fever, chills and diarrhea and was subsequently found to have C. difficile infection   05/04/2014 - 05/05/2014 Hospital Admission The patient was admitted to the hospital again with fever and chills. No source of infection was found and he was not given antibiotics.   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   06/04/2014 - 06/14/2014 Chemotherapy He was restarted back on Revlimid with dexamethasone only. Treatment was put on hold due to recurrent fever of unknown origin   01/14/2015 - 01/14/2015 Chemotherapy He received high dose melphalan   01/15/2015 Bone Marrow Transplant He received autologous BMT   01/22/2015 - 01/27/2015 [facility] was found to have E Coli bacteremia   02/28/2015 -  Chemotherapy He began maintenance Zometa only. Revlimid is on hold pending further evaluation for amyloidosis   04/17/2015 [facility]    INTERVAL HISTORY: Please see below for problem oriented charting.  since the last time I saw him, he feels well. Denies recent infection. He tolerated Zometa well. He is currently undergoing further evaluation for secondary amyloidosis and Revlimid is placed on hold. Treatment with Revlimid is placed on hold. [facility] [place] [facility] [place] [facility]  REVIEW OF SYSTEMS:   Constitutional: Denies fevers, chills or abnormal weight loss Eyes: Denies blurriness of vision Ears, nose, mouth, throat, and face: Denies mucositis or sore throat Respiratory: Denies cough, dyspnea or wheezes Cardiovascular: Denies  palpitation, chest discomfort or lower extremity swelling Gastrointestinal:  Denies  nausea, heartburn or change in bowel habits Skin: Denies abnormal skin rashes Lymphatics: Denies new lymphadenopathy or easy bruising Neurological:Denies numbness, tingling or new weaknesses Behavioral/Psych: Mood is stable, no new changes  All other systems were reviewed with the patient and are negative.  I have reviewed the past medical history, past surgical history, social history and family history with the patient and they are unchanged from previous note.  ALLERGIES:  is allergic to velcade and zometa.  MEDICATIONS:  Current Outpatient Prescriptions  Medication Sig Dispense Refill  . acyclovir (ZOVIRAX) 400 MG tablet Take 400 mg by mouth daily.    Marland Kitchen CALCIUM PO Take 1,000 mg by mouth daily.    . Cholecalciferol (VITAMIN D3) 2000 UNITS TABS Take 1 tablet by mouth daily.     . folic acid (FOLVITE) 1 MG tablet TAKE 1 TABLET BY MOUTH EVERY DAY 30 tablet 0  . insulin glargine (LANTUS) 100 UNIT/ML injection Inject 5-6 Units into the skin at bedtime. Take 5 units at Bedtime Everyday Except on Days of Chemo Take 6 units if blood sugar rises    . metFORMIN (GLUCOPHAGE) 500 MG tablet Take 500 mg by mouth 2 (two) times daily with a meal. On days of Chemo only  increase dose to 108m in the evening as needed for rise in blood sugar levels    . Multiple Vitamin (MULTIVITAMIN) tablet Take 1 tablet by mouth daily.    . pantoprazole (PROTONIX) 40 MG tablet Take 40 mg by mouth.    . potassium chloride (MICRO-K) 10 MEQ CR capsule Take 10 mEq by mouth 2 (two) times daily.     .Marland Kitchensulfamethoxazole-trimethoprim (BACTRIM DS,SEPTRA DS) 800-160 MG tablet Take 1 tablet by mouth 3 (three) times a week.     .Marland Kitchenacyclovir (ZOVIRAX) 800 MG tablet TK 1 T PO BID  2  . pravastatin (PRAVACHOL) 40 MG tablet Reported on 05/02/2015  0  . prochlorperazine (COMPAZINE) 10 MG tablet Take 10 mg by mouth. Reported on 05/02/2015     No current facility-administered medications for this visit.    PHYSICAL EXAMINATION: ECOG  PERFORMANCE STATUS: 0 - Asymptomatic  Filed Vitals:   05/02/15 1433  BP: 117/78  Pulse: 84  Temp: 98.4 F (36.9 C)  Resp: 17   Filed Weights   05/02/15 1433  Weight: 199 lb 12.8 oz (90.629 kg)    GENERAL:alert, no distress and comfortable SKIN: skin color, texture, turgor are normal, no rashes or significant lesions EYES: normal, Conjunctiva are pink and non-injected, sclera clear OROPHARYNX:no exudate, no erythema and lips, buccal mucosa, and tongue normal  NECK: supple, thyroid normal size, non-tender, without nodularity LYMPH:  no palpable lymphadenopathy in the cervical, axillary or inguinal LUNGS: clear to auscultation and percussion with normal breathing effort HEART: regular rate & rhythm and no murmurs and no lower extremity edema ABDOMEN:abdomen soft, non-tender and normal bowel sounds Musculoskeletal:no cyanosis of digits and no clubbing  NEURO: alert & oriented x 3 with fluent speech, no focal motor/sensory deficits  LABORATORY DATA:  I have reviewed the data as listed    Component Value Date/Time   NA 138 04/01/2015 1355   NA 139 05/27/2014 1000   K 3.9 04/01/2015 1355   K 3.3* 05/27/2014 1000   CL 107 05/27/2014 1000   CO2 26 04/01/2015 1355   CO2 27 05/27/2014 1000   GLUCOSE 86 04/01/2015 1355   GLUCOSE 127* 05/27/2014 1000   BUN 16.7  04/01/2015 1355   BUN 12 05/27/2014 1000   CREATININE 1.3 04/01/2015 1355   CREATININE 1.10 05/27/2014 1000   CALCIUM 9.6 04/01/2015 1355   CALCIUM 8.6 05/27/2014 1000   PROT 7.1 04/01/2015 1355   PROT 7.9 05/27/2014 1000   ALBUMIN 4.2 04/01/2015 1355   ALBUMIN 3.7 05/27/2014 1000   AST 27 04/01/2015 1355   AST 37 05/27/2014 1000   ALT 37 04/01/2015 1355   ALT 32 05/27/2014 1000   ALKPHOS 57 04/01/2015 1355   ALKPHOS 47 05/27/2014 1000   BILITOT 1.09 04/01/2015 1355   BILITOT 0.5 05/27/2014 1000   GFRNONAA 70* 05/27/2014 1000   GFRAA 81* 05/27/2014 1000    No results found for: SPEP, UPEP  Lab Results   Component Value Date   WBC 7.6 05/02/2015   NEUTROABS 3.5 05/02/2015   HGB 13.7 05/02/2015   HCT 41.1 05/02/2015   MCV 91.0 05/02/2015   PLT 212 05/02/2015      Chemistry      Component Value Date/Time   NA 138 04/01/2015 1355   NA 139 05/27/2014 1000   K 3.9 04/01/2015 1355   K 3.3* 05/27/2014 1000   CL 107 05/27/2014 1000   CO2 26 04/01/2015 1355   CO2 27 05/27/2014 1000   BUN 16.7 04/01/2015 1355   BUN 12 05/27/2014 1000   CREATININE 1.3 04/01/2015 1355   CREATININE 1.10 05/27/2014 1000      Component Value Date/Time   CALCIUM 9.6 04/01/2015 1355   CALCIUM 8.6 05/27/2014 1000   ALKPHOS 57 04/01/2015 1355   ALKPHOS 47 05/27/2014 1000   AST 27 04/01/2015 1355   AST 37 05/27/2014 1000   ALT 37 04/01/2015 1355   ALT 32 05/27/2014 1000   BILITOT 1.09 04/01/2015 1355   BILITOT 0.5 05/27/2014 1000     ASSESSMENT & PLAN:  Multiple myeloma in remission [facility])  The patient had recent blood work at [facility] which showed a light chains were within normal limits. Serum IgG was normal. Surprisingly, his bone marrow biopsy from 04/17/2015 show extensive amyloidosis. Further workup is in progress. The plan for treatment with Revlimid is placed on hold. In the meantime, we will continue monthly Zometa along with calcium and vitamin D supplement  S/P bone marrow transplant ([place]) He is taking antimicrobial prophylaxis. He will be due for post transplant vaccination in the near future at [facility].     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 Further treatment is placed on hold for now   No orders of the defined types were placed in this encounter.   All questions were answered. The patient knows to call the clinic with any problems, questions or concerns. No barriers to learning was detected. I spent 15 minutes counseling the patient face to face. The total time spent in the appointment was 20 minutes and more than 50% was on  counseling and review of test results     [facility], [place], MD 05/02/2015 2:57 PM

## 2015-05-02 NOTE — Telephone Encounter (Signed)
per pof to sch pt appt-sent MW email to sch pt trmt-pt to get updated copy b4 leaving °

## 2015-05-02 NOTE — Assessment & Plan Note (Signed)
He is undergoing further evaluation at [facility]. Further treatment is placed on hold for now

## 2015-05-02 NOTE — Telephone Encounter (Signed)
Per staff message and POF I have scheduled appts. Advised scheduler of appts. JMW  

## 2015-05-02 NOTE — Assessment & Plan Note (Signed)
The patient had recent blood work at [facility] which showed a light chains were within normal limits. Serum IgG was normal. Surprisingly, his bone marrow biopsy from 04/17/2015 show extensive amyloidosis. Further workup is in progress. The plan for treatment with Revlimid is placed on hold. In the meantime, we will continue monthly Zometa along with calcium and vitamin D supplement 

## 2015-05-02 NOTE — Assessment & Plan Note (Signed)
He is taking antimicrobial prophylaxis. He will be due for post transplant vaccination in the near future at [place].

## 2015-05-21 ENCOUNTER — Encounter: Payer: Self-pay | Admitting: Hematology and Oncology

## 2015-05-21 ENCOUNTER — Other Ambulatory Visit: Payer: Self-pay | Admitting: Hematology and Oncology

## 2015-05-21 ENCOUNTER — Telehealth: Payer: Self-pay | Admitting: *Deleted

## 2015-05-21 ENCOUNTER — Other Ambulatory Visit: Payer: Self-pay | Admitting: *Deleted

## 2015-05-21 DIAGNOSIS — C9001 Multiple myeloma in remission: Secondary | ICD-10-CM

## 2015-05-21 MED ORDER — LENALIDOMIDE 10 MG PO CAPS: 10.0000 mg | ORAL_CAPSULE | Freq: Every day | ORAL | Status: DC

## 2015-05-21 NOTE — Telephone Encounter (Signed)
Ebony Hail from [employer] office at [facility] called to say Dr Harvel Ricks wants to start patient on maintenance Revlimid post transplant. 10mg  daily for 21 days of 28 days. Call back # [phone]

## 2015-05-21 NOTE — Telephone Encounter (Signed)
Revlimid Rx to [facility] for prior authorization

## 2015-05-21 NOTE — Progress Notes (Signed)
Faxed biologics revlimid script for possible asst

## 2015-05-22 ENCOUNTER — Encounter: Payer: Self-pay | Admitting: Hematology and Oncology

## 2015-05-22 NOTE — Progress Notes (Signed)
Per biologics revlimid was shipped via fedex 05/21/15

## 2015-05-27 ENCOUNTER — Telehealth: Payer: Self-pay | Admitting: *Deleted

## 2015-05-27 NOTE — Telephone Encounter (Signed)
"  A cavity was found but I have been cleared to begin zometa.  I am now having a problem with the cavity and would like to know how long I'll have to wait before having any work on the cavity?  STEM cell was November 2016."  This nurse advised invasive dental work should be avoided during treatment.  Will ask provider.    Return number [phone].

## 2015-05-27 NOTE — Telephone Encounter (Signed)
We will cancel zometa until the dental issue is resolved I would probably hold for at least 3 months OK to take Revlimid I recommend he keeps his appt this week so we can discuss

## 2015-05-27 NOTE — Telephone Encounter (Signed)
Pt notified of message below.

## 2015-05-29 ENCOUNTER — Telehealth: Payer: Self-pay | Admitting: *Deleted

## 2015-05-29 NOTE — Telephone Encounter (Signed)
"  I have a rash to both legs.  I think this is a side effect of the Revlimid.  Rash started two days ago.  Legs Itch so I am putting baby oil on it to avoid scratching.  Rash looks red, flat with a few raised areas and itches.  Do I need to stop taking the Revlimid?"  [phone].  Next scheduled F/U is 05-30-2015.

## 2015-05-29 NOTE — Telephone Encounter (Signed)
Informed pt of Dr. Calton Dach instructions below.  He verbalized understanding.  He is scheduled to see Dr. Alvy Bimler tomorrow.

## 2015-05-29 NOTE — Telephone Encounter (Signed)
Stop Revlimid Take pictures Use benadryl 25 mg PO TID for itching

## 2015-05-30 ENCOUNTER — Ambulatory Visit: Payer: BC Managed Care – PPO

## 2015-05-30 ENCOUNTER — Telehealth: Payer: Self-pay | Admitting: Hematology and Oncology

## 2015-05-30 ENCOUNTER — Encounter: Payer: Self-pay | Admitting: Hematology and Oncology

## 2015-05-30 ENCOUNTER — Ambulatory Visit (HOSPITAL_BASED_OUTPATIENT_CLINIC_OR_DEPARTMENT_OTHER): Payer: BC Managed Care – PPO | Admitting: Hematology and Oncology

## 2015-05-30 ENCOUNTER — Other Ambulatory Visit (HOSPITAL_BASED_OUTPATIENT_CLINIC_OR_DEPARTMENT_OTHER): Payer: BC Managed Care – PPO

## 2015-05-30 VITALS — BP 120/63 | HR 70 | Temp 97.9°F | Resp 17 | Ht 72.0 in | Wt 206.0 lb

## 2015-05-30 DIAGNOSIS — Z9481 Bone marrow transplant status: Secondary | ICD-10-CM

## 2015-05-30 DIAGNOSIS — L270 Generalized skin eruption due to drugs and medicaments taken internally: Secondary | ICD-10-CM | POA: Diagnosis not present

## 2015-05-30 DIAGNOSIS — M549 Dorsalgia, unspecified: Secondary | ICD-10-CM

## 2015-05-30 DIAGNOSIS — C9001 Multiple myeloma in remission: Secondary | ICD-10-CM | POA: Diagnosis not present

## 2015-05-30 DIAGNOSIS — G8929 Other chronic pain: Secondary | ICD-10-CM | POA: Insufficient documentation

## 2015-05-30 DIAGNOSIS — K089 Disorder of teeth and supporting structures, unspecified: Secondary | ICD-10-CM | POA: Diagnosis not present

## 2015-05-30 DIAGNOSIS — E853 Secondary systemic amyloidosis: Secondary | ICD-10-CM

## 2015-05-30 DIAGNOSIS — E858 Other amyloidosis: Secondary | ICD-10-CM | POA: Diagnosis not present

## 2015-05-30 LAB — CBC WITH DIFFERENTIAL/PLATELET
BASO%: 0.4 % (ref 0.0–2.0)
BASOS ABS: 0 10*3/uL (ref 0.0–0.1)
EOS%: 3.9 % (ref 0.0–7.0)
Eosinophils Absolute: 0.2 10*3/uL (ref 0.0–0.5)
HCT: 39.5 % (ref 38.4–49.9)
HGB: 12.9 g/dL — ABNORMAL LOW (ref 13.0–17.1)
LYMPH#: 1.5 10*3/uL (ref 0.9–3.3)
LYMPH%: 26.2 % (ref 14.0–49.0)
MCH: 29.3 pg (ref 27.2–33.4)
MCHC: 32.8 g/dL (ref 32.0–36.0)
MCV: 89.5 fL (ref 79.3–98.0)
MONO#: 0.6 10*3/uL (ref 0.1–0.9)
MONO%: 11.4 % (ref 0.0–14.0)
NEUT#: 3.3 10*3/uL (ref 1.5–6.5)
NEUT%: 58.1 % (ref 39.0–75.0)
Platelets: 161 10*3/uL (ref 140–400)
RBC: 4.41 10*6/uL (ref 4.20–5.82)
RDW: 14.9 % — AB (ref 11.0–14.6)
WBC: 5.7 10*3/uL (ref 4.0–10.3)

## 2015-05-30 LAB — COMPREHENSIVE METABOLIC PANEL
ALT: 59 U/L — ABNORMAL HIGH (ref 0–55)
AST: 36 U/L — AB (ref 5–34)
Albumin: 4 g/dL (ref 3.5–5.0)
Alkaline Phosphatase: 49 U/L (ref 40–150)
Anion Gap: 8 mEq/L (ref 3–11)
BUN: 13.5 mg/dL (ref 7.0–26.0)
CHLORIDE: 109 meq/L (ref 98–109)
CO2: 21 mEq/L — ABNORMAL LOW (ref 22–29)
Calcium: 8.5 mg/dL (ref 8.4–10.4)
Creatinine: 1.3 mg/dL (ref 0.7–1.3)
EGFR: 56 mL/min/{1.73_m2} — ABNORMAL LOW (ref >90)
GLUCOSE: 99 mg/dL (ref 70–140)
POTASSIUM: 4 meq/L (ref 3.5–5.1)
SODIUM: 138 meq/L (ref 136–145)
Total Bilirubin: 0.94 mg/dL (ref 0.20–1.20)
Total Protein: 6.7 g/dL (ref 6.4–8.3)

## 2015-05-30 NOTE — Assessment & Plan Note (Signed)
The patient is returning to [facility] next month for his 6 months post transplant visit and vaccination. We'll continue close monitoring here for posttransplant related complications

## 2015-05-30 NOTE — Assessment & Plan Note (Signed)
He has classic Revlimid-induced skin rash. It is minor, grade 1 only. I recommend conservative management and holding off treatment for minimum a week before resuming it at a lower dose. If the rash recur, we might have to discontinue Revlimid permanently and switch him to Velcade as part of his maintenance treatment

## 2015-05-30 NOTE — Progress Notes (Signed)
[place] OFFICE PROGRESS NOTE  Patient Care Team: Hulan Fess, MD as PCP - General (Family Medicine) Heath Lark, MD as Consulting Physician (Hematology and Oncology) Truman Hayward, MD as Consulting Physician (Infectious Diseases) Garry Heater, DO as Referring Physician (Hematology)  SUMMARY OF ONCOLOGIC HISTORY: Oncology History   ISS Staging II, M-spike 1.7, IgG 2420, kappa 30.1, beta66mcroglobulin 4.48, Creatinine 1.2, calcium 9.8, hemoglobin 13.2, albumin 4.2     Multiple myeloma in remission ([place]   01/10/2003 Pathology Results [id number] pathology showed pure seminoma with lymphovascular invasion, pT2 NX MX   01/10/2003 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 congo red staing reveals amyloid deposition within some of the amorphous material and within blood vessels. Normal cytogenetics.   02/14/2014 Imaging PET CT scan showed diffuse skeletal involvement   02/26/2014 - 04/25/2014 Chemotherapy He is started on chemotherapy with Velcade, Cytoxan and dexamethasone. Cytoxan was discontinued due to recurrent admission and recent clostridium Diff infection.  Velcade was suspected to be a cause of fever that was discontinued.   03/09/2014 - 03/11/2014 Hospital Admission He was admitted to the hospital with fevers and chills and was diagnosed with pneumonia   03/22/2014 - 03/26/2014 Hospital Admission He is admitted to the hospital again for fever, chills and possible pneumonia   04/19/2014 - 04/22/2014 Hospital  Admission He is admitted to the hospital for fever, chills and diarrhea and was subsequently found to have C. difficile infection   05/04/2014 - 05/05/2014 Hospital Admission The patient was admitted to the hospital again with fever and chills. No source of infection was found and he was not given antibiotics.   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   06/04/2014 - 06/14/2014 Chemotherapy He was restarted back on Revlimid with dexamethasone only. Treatment was put on hold due to recurrent fever of unknown origin   01/14/2015 - 01/14/2015 Chemotherapy He received high dose melphalan   01/15/2015 Bone Marrow Transplant He received autologous BMT   01/22/2015 - 01/27/2015 [facility] was found to have E Coli bacteremia   02/28/2015 -  Chemotherapy He began maintenance Zometa only. Revlimid is on hold pending further evaluation for amyloidosis   04/17/2015 [facility]   05[facility]2015 [facility]   05/29/2015 Adverse Reaction He developed skin rash. Treatment was placed on hold    INTERVAL HISTORY: Please see below for problem oriented charting. He returns for further follow-up. He developed skin rash on his legs. The patient's wife showed me the pictures taken recently from his thighs which appeared to look like classic fine macular rash related to Revlimid. It was associated with itching. It rash and itching had improved since discontinuation of Revlimid. He complained a minor musculoskeletal pain after exertion on his back. No new  specific bone pain or bone fracture. He complains of a toothache and was recently evaluated by his dentist. He was  recommended to proceed with dental filling. The patient denies any recent signs or symptoms of bleeding such as spontaneous epistaxis, hematuria or hematochezia. He also requested me to review recent bone marrow biopsy and overall prognosis  REVIEW OF SYSTEMS:   Constitutional: Denies fevers, chills or abnormal weight loss Eyes: Denies blurriness of vision Ears, nose, mouth, throat, and face: Denies mucositis or sore throat Respiratory: Denies cough, dyspnea or wheezes Cardiovascular: Denies palpitation, chest discomfort or lower extremity swelling Gastrointestinal:  Denies nausea, heartburn or change in bowel habits Lymphatics: Denies new lymphadenopathy or easy bruising Neurological:Denies numbness, tingling or new weaknesses Behavioral/Psych: Mood is stable, no new changes  All other systems were reviewed with the patient and are negative.  I have reviewed the past medical history, past surgical history, social history and family history with the patient and they are unchanged from previous note.  ALLERGIES:  is allergic to velcade and zometa.  MEDICATIONS:  Current Outpatient Prescriptions  Medication Sig Dispense Refill  . acyclovir (ZOVIRAX) 800 MG tablet TK 1 T PO BID  2  . CALCIUM PO Take 1,000 mg by mouth daily.    . Cholecalciferol (VITAMIN D3) 2000 UNITS TABS Take 1 tablet by mouth daily.     . folic acid (FOLVITE) 1 MG tablet TAKE 1 TABLET BY MOUTH EVERY DAY 30 tablet 0  . lenalidomide (REVLIMID) 10 MG capsule Take 1 capsule (10 mg total) by mouth daily. 21 capsule 0  . Multiple Vitamin (MULTIVITAMIN) tablet Take 1 tablet by mouth daily.    . pantoprazole (PROTONIX) 40 MG tablet Take 40 mg by mouth.    . potassium chloride (MICRO-K) 10 MEQ CR capsule Take 10 mEq by mouth 2 (two) times daily.     . pravastatin (PRAVACHOL) 40 MG tablet Reported on 05/02/2015  0  . sulfamethoxazole-trimethoprim (BACTRIM DS,SEPTRA DS) 800-160 MG tablet Take 1 tablet by mouth 3 (three) times a  week.     . insulin glargine (LANTUS) 100 UNIT/ML injection Inject 5-6 Units into the skin at bedtime. Reported on 05/30/2015    . metFORMIN (GLUCOPHAGE) 500 MG tablet Take 500 mg by mouth 2 (two) times daily with a meal. Reported on 05/30/2015    . prochlorperazine (COMPAZINE) 10 MG tablet Take 10 mg by mouth. Reported on 05/30/2015     No current facility-administered medications for this visit.    PHYSICAL EXAMINATION: ECOG PERFORMANCE STATUS: 1 - Symptomatic but completely ambulatory  Filed Vitals:   05/30/15 1221  BP: 120/63  Pulse: 70  Temp: 97.9 F (36.6 C)  Resp: 17   Filed Weights   05/30/15 1221  Weight: 206 lb (93.441 kg)    GENERAL:alert, no distress and comfortable SKIN: Noted fine macular rash on his thighs, consistent with Revlimid-induced skin rash EYES: normal, Conjunctiva are pink and non-injected, sclera clear OROPHARYNX:no exudate, no erythema and lips, buccal mucosa, and tongue normal . Noted poor dentition Musculoskeletal:no cyanosis of digits and no clubbing  NEURO: alert & oriented x 3 with fluent speech, no focal motor/sensory deficits  LABORATORY DATA:  I have reviewed the data as listed    Component Value Date/Time   NA 138 05/30/2015 1210   NA 139 05/27/2014 1000   K 4.0 05/30/2015 1210   K 3.3* 05/27/2014 1000   CL 107 05/27/2014 1000   CO2 21* 05/30/2015 1210   CO2 27 05/27/2014 1000  GLUCOSE 99 05/30/2015 1210   GLUCOSE 127* 05/27/2014 1000   BUN 13.5 05/30/2015 1210   BUN 12 05/27/2014 1000   CREATININE 1.3 05/30/2015 1210   CREATININE 1.10 05/27/2014 1000   CALCIUM 8.5 05/30/2015 1210   CALCIUM 8.6 05/27/2014 1000   PROT 6.7 05/30/2015 1210   PROT 7.9 05/27/2014 1000   ALBUMIN 4.0 05/30/2015 1210   ALBUMIN 3.7 05/27/2014 1000   AST 36* 05/30/2015 1210   AST 37 05/27/2014 1000   ALT 59* 05/30/2015 1210   ALT 32 05/27/2014 1000   ALKPHOS 49 05/30/2015 1210   ALKPHOS 47 05/27/2014 1000   BILITOT 0.94 05/30/2015 1210   BILITOT 0.5  05/27/2014 1000   GFRNONAA 70* 05/27/2014 1000   GFRAA 81* 05/27/2014 1000    No results found for: SPEP, UPEP  Lab Results  Component Value Date   WBC 5.7 05/30/2015   NEUTROABS 3.3 05/30/2015   HGB 12.9* 05/30/2015   HCT 39.5 05/30/2015   MCV 89.5 05/30/2015   PLT 161 05/30/2015      Chemistry      Component Value Date/Time   NA 138 05/30/2015 1210   NA 139 05/27/2014 1000   K 4.0 05/30/2015 1210   K 3.3* 05/27/2014 1000   CL 107 05/27/2014 1000   CO2 21* 05/30/2015 1210   CO2 27 05/27/2014 1000   BUN 13.5 05/30/2015 1210   BUN 12 05/27/2014 1000   CREATININE 1.3 05/30/2015 1210   CREATININE 1.10 05/27/2014 1000      Component Value Date/Time   CALCIUM 8.5 05/30/2015 1210   CALCIUM 8.6 05/27/2014 1000   ALKPHOS 49 05/30/2015 1210   ALKPHOS 47 05/27/2014 1000   AST 36* 05/30/2015 1210   AST 37 05/27/2014 1000   ALT 59* 05/30/2015 1210   ALT 32 05/27/2014 1000   BILITOT 0.94 05/30/2015 1210   BILITOT 0.5 05/27/2014 1000      ASSESSMENT & PLAN:  Multiple myeloma in remission ([place]) Unfortunately, the patient developed skin rash. The appearance is classic for Revlimid-induced skin rash. His skin is improving after discontinuation of Revlimid. I recommend he wait another week before resuming Revlimid at a lower dose. I will reassess again next month.  Drug-induced skin rash He has classic Revlimid-induced skin rash. It is minor, grade 1 only. I recommend conservative management and holding off treatment for minimum a week before resuming it at a lower dose. If the rash recur, we might have to discontinue Revlimid permanently and switch him to Velcade as part of his maintenance treatment  S/P bone marrow transplant [facility]) The patient is returning to [facility] next month for his 6 months post transplant visit and vaccination. We'll continue close monitoring here for posttransplant related complications  Secondary amyloidosis ([place]) The patient is  asymptomatic. Recommend observation only Recent bone marrow biopsy is negative  Poor dentition The patient initially presented with multiple lytic lesions on his mandible. He had received multiple doses of Zometa and was recently noted to have some dental caries requiring minor dental filling. I recommend holding off further treatment with Zometa. There is no contraindication for him to proceed with minor dental procedure as long as it does not involve root canal procedure or dental extraction. I gave the patient a letter to bring to his dentist  Chronic back pain He has minor, chronic back pain after exertion. I suspect this is a minor problem related to multiple myeloma and recent treatment. I recommend conservative management and graduated exercise as  tolerated.   Orders Placed This Encounter  Procedures  . Kappa/lambda light chains    Standing Status: Future     Number of Occurrences:      Standing Expiration Date: 07/03/2016  . Multiple Myeloma Panel (SPEP&IFE w/QIG)    Standing Status: Future     Number of Occurrences:      Standing Expiration Date: 07/03/2016   All questions were answered. The patient knows to call the clinic with any problems, questions or concerns. No barriers to learning was detected. I spent 20 minutes counseling the patient face to face. The total time spent in the appointment was 25 minutes and more than 50% was on counseling and review of test results     [facility], Kathia Covington, MD 05/30/2015 1:13 PM

## 2015-05-30 NOTE — Assessment & Plan Note (Signed)
The patient initially presented with multiple lytic lesions on his mandible. He had received multiple doses of Zometa and was recently noted to have some dental caries requiring minor dental filling. I recommend holding off further treatment with Zometa. There is no contraindication for him to proceed with minor dental procedure as long as it does not involve root canal procedure or dental extraction. I gave the patient a letter to bring to his dentist

## 2015-05-30 NOTE — Assessment & Plan Note (Signed)
Unfortunately, the patient developed skin rash. The appearance is classic for Revlimid-induced skin rash. His skin is improving after discontinuation of Revlimid. I recommend he wait another week before resuming Revlimid at a lower dose. I will reassess again next month.

## 2015-05-30 NOTE — Telephone Encounter (Signed)
Gave and printed appt sched and avs for April

## 2015-05-30 NOTE — Assessment & Plan Note (Signed)
The patient is asymptomatic. Recommend observation only Recent bone marrow biopsy is negative

## 2015-05-30 NOTE — Assessment & Plan Note (Signed)
He has minor, chronic back pain after exertion. I suspect this is a minor problem related to multiple myeloma and recent treatment. I recommend conservative management and graduated exercise as tolerated.

## 2015-06-07 ENCOUNTER — Telehealth: Payer: Self-pay | Admitting: *Deleted

## 2015-06-07 NOTE — Telephone Encounter (Signed)
Called pt in reference to a call he made yesterday evening to call center nurse.  His wife is sick possibly w/ the flu and he asks if he should be started on Tamiflu preventively and ok to take his Revlimid?  Pt states he feels well,  No symptoms of illness.  His wife at Urgent Care now for flu like symptoms.  Informed him ok to take Revlimid as ordered.  He does not need to be treated for flu even if his wife has the flu.  Call us if he starts feeling sick w/ any cough, congestion and/or fevers.  Practice good hand washing and try to avoid close contact w/ his wife while she is ill.  He verbalized understanding.

## 2015-06-18 ENCOUNTER — Other Ambulatory Visit: Payer: Self-pay | Admitting: *Deleted

## 2015-06-18 MED ORDER — LENALIDOMIDE 10 MG PO CAPS: 10.0000 mg | ORAL_CAPSULE | Freq: Every day | ORAL | Status: DC

## 2015-06-21 ENCOUNTER — Telehealth: Payer: Self-pay | Admitting: *Deleted

## 2015-06-21 NOTE — Telephone Encounter (Signed)
Pt states his 63 year old Daughter went to MD for fever/ congestion today.  She tested negative for Strep and Flu but her MD started her on Tamiflu just in case the test was wrong.  Pt denies any fever or symptoms himself.  Instructed pt to avoid being in close proximity w/ his daughter and practice good handwashing.  Call us or go to Urgent Care (over the weekend) if he develops any fevers or symptoms himself.  He verbalized understanding.

## 2015-06-24 ENCOUNTER — Other Ambulatory Visit: Payer: Self-pay | Admitting: Hematology and Oncology

## 2015-07-03 ENCOUNTER — Other Ambulatory Visit: Payer: Self-pay | Admitting: Hematology and Oncology

## 2015-07-03 ENCOUNTER — Telehealth: Payer: Self-pay | Admitting: *Deleted

## 2015-07-03 DIAGNOSIS — C9001 Multiple myeloma in remission: Secondary | ICD-10-CM

## 2015-07-03 MED ORDER — LENALIDOMIDE 5 MG PO CAPS: ORAL_CAPSULE | ORAL | Status: DC

## 2015-07-03 NOTE — Telephone Encounter (Signed)
[facility] and [employer] notified of Revlimid dose reduction. Pt notified prescription will be 5 mg daily for 21 days, off 7 days.

## 2015-07-03 NOTE — Telephone Encounter (Signed)
TC from patient requesting refill on his Revlimid. He states his dosage has been changed d/t skin rash . He takes 1 tablet every other day currently.   He states he has 1 tablet left.

## 2015-07-04 ENCOUNTER — Telehealth: Payer: Self-pay | Admitting: Nurse Practitioner

## 2015-07-04 ENCOUNTER — Telehealth: Payer: Self-pay

## 2015-07-04 ENCOUNTER — Ambulatory Visit (HOSPITAL_COMMUNITY)
Admission: RE | Admit: 2015-07-04 | Discharge: 2015-07-04 | Disposition: A | Discharge status: Home / Self Care | Payer: BC Managed Care – PPO | Source: Ambulatory Visit | Attending: Nurse Practitioner | Admitting: Nurse Practitioner

## 2015-07-04 ENCOUNTER — Telehealth: Payer: Self-pay | Admitting: *Deleted

## 2015-07-04 ENCOUNTER — Other Ambulatory Visit: Payer: Self-pay | Admitting: Nurse Practitioner

## 2015-07-04 ENCOUNTER — Ambulatory Visit (HOSPITAL_BASED_OUTPATIENT_CLINIC_OR_DEPARTMENT_OTHER): Payer: BC Managed Care – PPO | Admitting: Nurse Practitioner

## 2015-07-04 VITALS — BP 117/65 | HR 70 | Temp 98.0°F | Resp 18 | Ht 72.0 in | Wt 207.0 lb

## 2015-07-04 DIAGNOSIS — C9001 Multiple myeloma in remission: Secondary | ICD-10-CM

## 2015-07-04 DIAGNOSIS — I83811 Varicose veins of right lower extremities with pain: Secondary | ICD-10-CM | POA: Diagnosis not present

## 2015-07-04 DIAGNOSIS — M79604 Pain in right leg: Secondary | ICD-10-CM | POA: Insufficient documentation

## 2015-07-04 DIAGNOSIS — R609 Edema, unspecified: Secondary | ICD-10-CM | POA: Diagnosis present

## 2015-07-04 NOTE — Telephone Encounter (Signed)
per pof to add to [facility] MGMT-talkes w/Cyndee and aadv put on @2 :30-Cyndee ok

## 2015-07-04 NOTE — Telephone Encounter (Signed)
Patient calling asking when he is suppose to start his new dose of revlimid.  Tonight he takes his last dose of the 10 mg revlimid which he has been taking every other day.  Six days from today he has labs.  Writer assuming he starts 7 days from his final dose which is today.  Labs in 7 days then on the 8th day he begins new 5 mg dose, which would be on 07/12/15.  If different patient would like to know.  Patient also c/o right calf "soreness" which he states is new.  When sitting and placinghis right leg in front of him, it does not "hurt".  Writer asked him to flex his right foot towards his body, patient states that he then has pain.  Note being sent to desk RN for further evaluation.

## 2015-07-04 NOTE — Telephone Encounter (Signed)
Called and had doppler scheduled at [place] at 230, pt to see Selena Lesser after doppler at [facility].  Called and informed patient to head to [facility] for dopplerand then come straight to cancer center when finished. Patient verbalized understanding

## 2015-07-04 NOTE — Telephone Encounter (Signed)
Called pt regarding pain in right calf.  He reports started this morning in bed.  Says it stopped hurting unless he flexes his foot forward then there is pain in his calf.  He has one Revlimid left this cycle to take tonight.   Dr. Alvy Bimler out of office this afternoon.   Pt will have Doppler US ordered and be seen by Selena Lesser,  NP.

## 2015-07-04 NOTE — Progress Notes (Signed)
*  PRELIMINARY RESULTS* Vascular Ultrasound Right lower extremity venous duplex has been completed.  Preliminary findings: No evidence of DVT or baker's cyst.   Called results to [facility]. Directed patient to go to Cancer center as discussed.  Landry Mellow, RDMS, RVT  07/04/2015, 3:25 PM

## 2015-07-06 ENCOUNTER — Encounter: Payer: Self-pay | Admitting: Nurse Practitioner

## 2015-07-06 DIAGNOSIS — I83811 Varicose veins of right lower extremities with pain: Secondary | ICD-10-CM | POA: Insufficient documentation

## 2015-07-06 NOTE — Progress Notes (Signed)
SYMPTOM MANAGEMENT CLINIC    Chief Complaint: Right leg pain.  HPI:  Tyler Willis. 65 y.o. male diagnosed with multiple myeloma.  Currently undergoing Revlimid oral therapy.   Patient reported to the [facility] today with complaint of some right lower leg discomfort.  He denies any edema to the leg.  He also denies any recent injury or trauma to his leg.  Doppler ultrasound obtained today was negative for DVT.  Exam today reveals no erythema, no edema, no warmth, and no red streaks.  It does appear the patient has an enlarged varicose pain to the right inner lower leg.  There is no obvious infection/cellulitis noted.  Patient was advised to elevate his leg up off the level of his heart whenever possible.  He was also advised that he may try some warm compresses to the site.  Also recommended that patient try some compression stockings as well.  Oncology History   ISS Staging II, M-spike 1.7, IgG 2420, kappa 30.1, beta2mcroglobulin 4.48, Creatinine 1.2, calcium 9.8, hemoglobin 13.2, albumin 4.2     Multiple myeloma in remission ([place]   01/10/2003 Pathology Results [id number] pathology showed pure seminoma with lymphovascular invasion, pT2 NX MX   01/10/2003 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 congo red staing reveals amyloid deposition within some of the amorphous material and within blood vessels. Normal cytogenetics.   02/14/2014 Imaging PET CT scan showed diffuse skeletal involvement   02/26/2014 - 04/25/2014 Chemotherapy He  is started on chemotherapy with Velcade, Cytoxan and dexamethasone. Cytoxan was discontinued due to recurrent admission and recent clostridium Diff infection.  Velcade was suspected to be a cause of fever that was discontinued.   03/09/2014 - 03/11/2014 Hospital Admission He was admitted to the hospital with fevers and chills and was diagnosed with pneumonia   03/22/2014 - 03/26/2014 Hospital Admission He is admitted to the hospital again for fever, chills and possible pneumonia   04/19/2014 - 04/22/2014 Hospital Admission He is admitted to the hospital for fever, chills and diarrhea and was subsequently found to have C. difficile infection   05/04/2014 - 05/05/2014 Hospital Admission The patient was admitted to the hospital again with fever and chills. No source of infection was found and he was not given antibiotics.   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   06/04/2014 - 06/14/2014 Chemotherapy He was restarted back on Revlimid with dexamethasone only. Treatment was put on hold due to recurrent fever of unknown origin   01/14/2015 - 01/14/2015 Chemotherapy He received high dose melphalan   01/15/2015 Bone Marrow Transplant He received autologous BMT   01/22/2015 - 01/27/2015 [facility] was found to have E Coli bacteremia   02/28/2015 -  Chemotherapy He began maintenance Zometa only. Revlimid is on hold pending further evaluation for amyloidosis   04/17/2015 [facility]   05/21/2015  Bone Marrow Biopsy Repeat bone marrow biopsy at [facility] showed no residual plasma cells or amyloidosis   05/29/2015 Adverse Reaction He developed skin rash. Treatment was placed on hold    Review of Systems  Musculoskeletal:       Right lower leg pain    All other systems reviewed and are negative.   Past Medical History  Diagnosis Date  . Diabetes mellitus without complication ([place])   . GERD (gastroesophageal reflux disease)   . Cough 07/02/2014  . Bilateral calf pain 07/02/2014  . 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 Multiple myeloma ([place]) 02/19/2014  . Multiple myeloma [facility])     Past Surgical History  Procedure Laterality Date  . Testicle removal Right 2004  . Sternum biopsy      for dx of multiple myeloma  . 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    is allergic to velcade and zometa.    Medication List       This list is accurate as of: 07/04/15 11:59 PM.  Always use your most recent med list.               acyclovir 800 MG tablet  Commonly known as:  ZOVIRAX  TK 1 T PO BID     CALCIUM PO  Take 1,000 mg by mouth daily.     folic acid 1 MG tablet  Commonly known as:  FOLVITE  TAKE 1 TABLET BY MOUTH EVERY DAY     insulin glargine 100 UNIT/ML injection  Commonly known as:  LANTUS  Inject 5-6 Units into the skin at bedtime. Reported on 07/04/2015     lenalidomide 10 MG capsule  Commonly known as:  REVLIMID  Take 1 capsule (10 mg total) by mouth daily.     lenalidomide 5 MG capsule  Commonly known as:  REVLIMID  Take 1 capsule daily for 21 days, off 7 days     metFORMIN 500 MG tablet   Commonly known as:  GLUCOPHAGE  Take 500 mg by mouth 2 (two) times daily with a meal. Reported on 07/04/2015     multivitamin tablet  Take 1 tablet by mouth daily.     potassium chloride 10 MEQ CR capsule  Commonly known as:  MICRO-K  Take 10 mEq by mouth 2 (two) times daily.     pravastatin 40 MG tablet  Commonly known as:  PRAVACHOL  Reported on 07/04/2015     prochlorperazine 10 MG tablet  Commonly known as:  COMPAZINE  Take 10 mg by mouth. Reported on 07/04/2015     PROTONIX 40 MG tablet  Generic drug:  pantoprazole  Take 40 mg by mouth.     Vitamin D3 2000 units Tabs  Take 1 tablet by mouth daily.         PHYSICAL EXAMINATION  Oncology Vitals 07/04/2015 05/30/2015  Height 183 cm 183 cm  Weight 93.895 kg 93.441 kg  Weight (lbs) 207 lbs 206 lbs  BMI (kg/m2) 28.07  kg/m2 27.94 kg/m2  Temp 98 97.9  Pulse 70 70  Resp 18 17  SpO2 98 98  BSA (m2) 2.18 m2 2.18 m2   BP Readings from Last 2 Encounters:  07/04/15 117/65  05/30/15 120/63    Physical Exam  Constitutional: He is well-developed, well-nourished, and in no distress.  HENT:  Head: Normocephalic and atraumatic.  Eyes: Conjunctivae and EOM are normal. Pupils are equal, round, and reactive to light. Right eye exhibits no discharge. Left eye exhibits no discharge. No scleral icterus.  Neck: Normal range of motion.  Pulmonary/Chest: Effort normal. No respiratory distress.  Musculoskeletal: Normal range of motion. He exhibits tenderness. He exhibits no edema.  Right lower inner leg with a varicose vein that is somewhat tender with palpation.  No evidence of infection noted.  Nursing note and vitals reviewed.   LABORATORY DATA:. No visits with results within 3 Day(s) from this visit. Latest known visit with results is:  Appointment on 05/30/2015  Component Date Value Ref Range Status  . WBC 05/30/2015 5.7  4.0 - 10.3 10e3/uL Final  . NEUT# 05/30/2015 3.3  1.5 - 6.5 10e3/uL Final  . HGB 05/30/2015 12.9* 13.0 -  17.1 g/dL Final  . HCT 05/30/2015 39.5  38.4 - 49.9 % Final  . Platelets 05/30/2015 161  140 - 400 10e3/uL Final  . MCV 05/30/2015 89.5  79.3 - 98.0 fL Final  . MCH 05/30/2015 29.3  27.2 - 33.4 pg Final  . MCHC 05/30/2015 32.8  32.0 - 36.0 g/dL Final  . RBC 05/30/2015 4.41  4.20 - 5.82 10e6/uL Final  . RDW 05/30/2015 14.9* 11.0 - 14.6 % Final  . lymph# 05/30/2015 1.5  0.9 - 3.3 10e3/uL Final  . MONO# 05/30/2015 0.6  0.1 - 0.9 10e3/uL Final  . Eosinophils Absolute 05/30/2015 0.2  0.0 - 0.5 10e3/uL Final  . Basophils Absolute 05/30/2015 0.0  0.0 - 0.1 10e3/uL Final  . NEUT% 05/30/2015 58.1  39.0 - 75.0 % Final  . LYMPH% 05/30/2015 26.2  14.0 - 49.0 % Final  . MONO% 05/30/2015 11.4  0.0 - 14.0 % Final  . EOS% 05/30/2015 3.9  0.0 - 7.0 % Final  . BASO% 05/30/2015 0.4  0.0 - 2.0 % Final  . Sodium 05/30/2015 138  136 - 145 mEq/L Final  . Potassium 05/30/2015 4.0  3.5 - 5.1 mEq/L Final  . Chloride 05/30/2015 109  98 - 109 mEq/L Final  . CO2 05/30/2015 21* 22 - 29 mEq/L Final  . Glucose 05/30/2015 99  70 - 140 mg/dl Final   Glucose reference range is for nonfasting patients. Fasting glucose reference range is 70- 100.  Marland Kitchen BUN 05/30/2015 13.5  7.0 - 26.0 mg/dL Final  . Creatinine 05/30/2015 1.3  0.7 - 1.3 mg/dL Final  . Total Bilirubin 05/30/2015 0.94  0.20 - 1.20 mg/dL Final  . Alkaline Phosphatase 05/30/2015 49  40 - 150 U/L Final  . AST 05/30/2015 36* 5 - 34 U/L Final  . ALT 05/30/2015 59* 0 - 55 U/L Final  . Total Protein 05/30/2015 6.7  6.4 - 8.3 g/dL Final  . Albumin 05/30/2015 4.0  3.5 - 5.0 g/dL Final  . Calcium 05/30/2015 8.5  8.4 - 10.4 mg/dL Final  . Anion Gap 05/30/2015 8  3 - 11 mEq/L Final  . EGFR 05/30/2015 56* >90 ml/min/1.73 m2 Final   eGFR is calculated using the CKD-EPI Creatinine Equation (2009)   Doppler: *PRELIMINARY RESULTS* Vascular Ultrasound Right lower extremity venous duplex has been  completed. Preliminary findings: No evidence of DVT or baker's  cyst.   Called results to [place]. Directed patient to go to Cancer center as discussed.  RADIOGRAPHIC STUDIES: No results found.  ASSESSMENT/PLAN:    Multiple myeloma in remission [facility]) Patient is currently undergoing Revlimid oral therapy.  He states that he is managing the Revlimid much better with this cycle.  He denies having any rash at the present time.  Patient last received Zometa on 05/02/2015.  Last chart note.  Advised that further Zometa would be held; secondary to mandible lesions.  Will confirm with Dr. Alvy Bimler.  Patient scheduled to return for labs on 07/11/2015.    Varicose veins of right lower extremity with pain Patient reported to the [facility] today with complaint of some right lower leg discomfort.  He denies any edema to the leg.  He also denies any recent injury or trauma to his leg.  Doppler ultrasound obtained today was negative for DVT.  Exam today reveals no erythema, no edema, no warmth, and no red streaks.  It does appear the patient has an enlarged varicose pain to the right inner lower leg.  There is no obvious infection/cellulitis noted.  Patient was advised to elevate his leg up off the level of his heart whenever possible.  He was also advised that he may try some warm compresses to the site.  Also recommended that patient try some compression stockings as well.   Patient stated understanding of all instructions; and was in agreement with this plan of care. The patient knows to call the clinic with any problems, questions or concerns.   Total time spent with patient was 25 minutes;  with greater than 75 percent of that time spent in face to face counseling regarding patient's symptoms,  and coordination of care and follow up.  Disclaimer:This dictation was prepared with Dragon/digital dictation along with [employer]. Any transcriptional errors that result from this process are unintentional.  Drue Second, NP 07/06/2015

## 2015-07-06 NOTE — Assessment & Plan Note (Signed)
Patient reported to the [place] today with complaint of some right lower leg discomfort.  He denies any edema to the leg.  He also denies any recent injury or trauma to his leg.  Doppler ultrasound obtained today was negative for DVT.  Exam today reveals no erythema, no edema, no warmth, and no red streaks.  It does appear the patient has an enlarged varicose pain to the right inner lower leg.  There is no obvious infection/cellulitis noted.  Patient was advised to elevate his leg up off the level of his heart whenever possible.  He was also advised that he may try some warm compresses to the site.  Also recommended that patient try some compression stockings as well.

## 2015-07-06 NOTE — Assessment & Plan Note (Signed)
Patient is currently undergoing Revlimid oral therapy.  He states that he is managing the Revlimid much better with this cycle.  He denies having any rash at the present time.  Patient last received Zometa on 05/02/2015.  Last chart note.  Advised that further Zometa would be held; secondary to mandible lesions.  Will confirm with Dr. Alvy Bimler.  Patient scheduled to return for labs on 07/11/2015.

## 2015-07-11 ENCOUNTER — Other Ambulatory Visit: Payer: Self-pay | Admitting: Hematology and Oncology

## 2015-07-11 ENCOUNTER — Other Ambulatory Visit (HOSPITAL_BASED_OUTPATIENT_CLINIC_OR_DEPARTMENT_OTHER): Payer: BC Managed Care – PPO

## 2015-07-11 DIAGNOSIS — C9001 Multiple myeloma in remission: Secondary | ICD-10-CM

## 2015-07-11 LAB — COMPREHENSIVE METABOLIC PANEL
ALT: 57 U/L — AB (ref 0–55)
ANION GAP: 9 meq/L (ref 3–11)
AST: 36 U/L — AB (ref 5–34)
Albumin: 4.1 g/dL (ref 3.5–5.0)
Alkaline Phosphatase: 50 U/L (ref 40–150)
BILIRUBIN TOTAL: 1.2 mg/dL (ref 0.20–1.20)
BUN: 18.2 mg/dL (ref 7.0–26.0)
CHLORIDE: 108 meq/L (ref 98–109)
CO2: 23 meq/L (ref 22–29)
CREATININE: 1.5 mg/dL — AB (ref 0.7–1.3)
Calcium: 9.1 mg/dL (ref 8.4–10.4)
EGFR: 49 mL/min/{1.73_m2} — ABNORMAL LOW (ref >90)
Glucose: 129 mg/dl (ref 70–140)
Potassium: 3.9 mEq/L (ref 3.5–5.1)
Sodium: 139 mEq/L (ref 136–145)
TOTAL PROTEIN: 7.1 g/dL (ref 6.4–8.3)

## 2015-07-11 LAB — CBC WITH DIFFERENTIAL/PLATELET
BASO%: 1.8 % (ref 0.0–2.0)
BASOS ABS: 0.1 10*3/uL (ref 0.0–0.1)
EOS%: 4.3 % (ref 0.0–7.0)
Eosinophils Absolute: 0.2 10*3/uL (ref 0.0–0.5)
HEMATOCRIT: 40.3 % (ref 38.4–49.9)
HEMOGLOBIN: 13.4 g/dL (ref 13.0–17.1)
LYMPH#: 2.2 10*3/uL (ref 0.9–3.3)
LYMPH%: 38 % (ref 14.0–49.0)
MCH: 29.9 pg (ref 27.2–33.4)
MCHC: 33.2 g/dL (ref 32.0–36.0)
MCV: 90.2 fL (ref 79.3–98.0)
MONO#: 0.8 10*3/uL (ref 0.1–0.9)
MONO%: 14.6 % — ABNORMAL HIGH (ref 0.0–14.0)
NEUT#: 2.4 10*3/uL (ref 1.5–6.5)
NEUT%: 41.3 % (ref 39.0–75.0)
PLATELETS: 188 10*3/uL (ref 140–400)
RBC: 4.46 10*6/uL (ref 4.20–5.82)
RDW: 16.2 % — AB (ref 11.0–14.6)
WBC: 5.7 10*3/uL (ref 4.0–10.3)

## 2015-07-12 LAB — KAPPA/LAMBDA LIGHT CHAINS
Ig Kappa Free Light Chain: 14.8 mg/L (ref 3.30–19.40)
Ig Lambda Free Light Chain: 9.33 mg/L (ref 5.71–26.30)
KAPPA/LAMBDA FLC RATIO: 1.59 (ref 0.26–1.65)

## 2015-07-15 LAB — MULTIPLE MYELOMA PANEL, SERUM
ALBUMIN SERPL ELPH-MCNC: 3.8 g/dL (ref 2.9–4.4)
ALBUMIN/GLOB SERPL: 1.6 (ref 0.7–1.7)
Alpha 1: 0.1 g/dL (ref 0.0–0.4)
Alpha2 Glob SerPl Elph-Mcnc: 0.8 g/dL (ref 0.4–1.0)
B-Globulin SerPl Elph-Mcnc: 0.9 g/dL (ref 0.7–1.3)
GLOBULIN, TOTAL: 2.5 g/dL (ref 2.2–3.9)
Gamma Glob SerPl Elph-Mcnc: 0.7 g/dL (ref 0.4–1.8)
IGG (IMMUNOGLOBIN G), SERUM: 812 mg/dL (ref 700–1600)
IgA, Qn, Serum: 25 mg/dL — ABNORMAL LOW (ref 61–437)
IgM, Qn, Serum: 9 mg/dL — ABNORMAL LOW (ref 20–172)
M Protein SerPl Elph-Mcnc: 0.1 g/dL — ABNORMAL HIGH
Total Protein: 6.3 g/dL (ref 6.0–8.5)

## 2015-07-18 ENCOUNTER — Ambulatory Visit (HOSPITAL_BASED_OUTPATIENT_CLINIC_OR_DEPARTMENT_OTHER): Payer: BC Managed Care – PPO | Admitting: Hematology and Oncology

## 2015-07-18 ENCOUNTER — Telehealth: Payer: Self-pay | Admitting: Hematology and Oncology

## 2015-07-18 ENCOUNTER — Ambulatory Visit (HOSPITAL_BASED_OUTPATIENT_CLINIC_OR_DEPARTMENT_OTHER): Payer: BC Managed Care – PPO

## 2015-07-18 ENCOUNTER — Other Ambulatory Visit: Payer: Self-pay | Admitting: Hematology and Oncology

## 2015-07-18 ENCOUNTER — Encounter: Payer: Self-pay | Admitting: Hematology and Oncology

## 2015-07-18 VITALS — BP 137/79 | HR 73 | Temp 98.4°F | Resp 18 | Ht 72.0 in | Wt 209.0 lb

## 2015-07-18 DIAGNOSIS — Z9481 Bone marrow transplant status: Secondary | ICD-10-CM

## 2015-07-18 DIAGNOSIS — C9001 Multiple myeloma in remission: Secondary | ICD-10-CM

## 2015-07-18 DIAGNOSIS — R748 Abnormal levels of other serum enzymes: Secondary | ICD-10-CM

## 2015-07-18 DIAGNOSIS — R7989 Other specified abnormal findings of blood chemistry: Secondary | ICD-10-CM | POA: Insufficient documentation

## 2015-07-18 DIAGNOSIS — E119 Type 2 diabetes mellitus without complications: Secondary | ICD-10-CM

## 2015-07-18 MED ORDER — ZOLEDRONIC ACID 4 MG/5ML IV CONC
3.5000 mg | Freq: Once | INTRAVENOUS | Status: AC
  Administered 2015-07-18: 3.5 mg via INTRAVENOUS
  Filled 2015-07-18: qty 4.38

## 2015-07-18 MED ORDER — SODIUM CHLORIDE 0.9 % IV SOLN
Freq: Once | INTRAVENOUS | Status: AC
  Administered 2015-07-18: 14:00:00 via INTRAVENOUS

## 2015-07-18 NOTE — Progress Notes (Signed)
[place] OFFICE PROGRESS NOTE  Patient Care Team: Hulan Fess, MD as PCP - General (Family Medicine) Heath Lark, MD as Consulting Physician (Hematology and Oncology) Truman Hayward, MD as Consulting Physician (Infectious Diseases) Garry Heater, DO as Referring Physician (Hematology)  SUMMARY OF ONCOLOGIC HISTORY: Oncology History   ISS Staging II, M-spike 1.7, IgG 2420, kappa 30.1, beta45mcroglobulin 4.48, Creatinine 1.2, calcium 9.8, hemoglobin 13.2, albumin 4.2     Multiple myeloma in remission ([place]   01/10/2003 Pathology Results [id number] pathology showed pure seminoma with lymphovascular invasion, pT2 NX MX   01/10/2003 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 congo red staing reveals amyloid deposition within some of the amorphous material and within blood vessels. Normal cytogenetics.   02/14/2014 Imaging PET CT scan showed diffuse skeletal involvement   02/26/2014 - 04/25/2014 Chemotherapy He is started on chemotherapy with Velcade, Cytoxan and dexamethasone. Cytoxan was discontinued due to recurrent admission and recent clostridium Diff infection.  Velcade was suspected to be a cause of fever that was discontinued.   03/09/2014 - 03/11/2014 Hospital Admission He was admitted to the hospital with fevers and chills and was diagnosed with pneumonia   03/22/2014 - 03/26/2014 Hospital Admission He is admitted to the hospital again for fever, chills and possible pneumonia   04/19/2014 - 04/22/2014 Hospital  Admission He is admitted to the hospital for fever, chills and diarrhea and was subsequently found to have C. difficile infection   05/04/2014 - 05/05/2014 Hospital Admission The patient was admitted to the hospital again with fever and chills. No source of infection was found and he was not given antibiotics.   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   06/04/2014 - 06/14/2014 Chemotherapy He was restarted back on Revlimid with dexamethasone only. Treatment was put on hold due to recurrent fever of unknown origin   01/14/2015 - 01/14/2015 Chemotherapy He received high dose melphalan   01/15/2015 Bone Marrow Transplant He received autologous BMT   01/22/2015 - 01/27/2015 [facility] was found to have E Coli bacteremia   02/28/2015 -  Chemotherapy He began maintenance Zometa only. Revlimid is on hold pending further evaluation for amyloidosis   04/17/2015 [facility]   05/[facility]/2015 [facility]   05/29/2015 Adverse Reaction He developed skin rash. Treatment was placed on hold    INTERVAL HISTORY: Please see below for problem oriented charting. He returns for further follow-up. Skin rashes improved. He tolerated reduced dose Revlimid well. He denies dental problem. [facility]. I reviewed his records. He denies new bone pain.  REVIEW OF SYSTEMS:   Constitutional: Denies fevers, chills or abnormal weight loss Eyes: Denies blurriness of vision Ears, nose, mouth,  throat, and face: Denies mucositis or sore throat Respiratory: Denies cough, dyspnea or  wheezes Cardiovascular: Denies palpitation, chest discomfort or lower extremity swelling Gastrointestinal:  Denies nausea, heartburn or change in bowel habits Skin: Denies abnormal skin rashes Lymphatics: Denies new lymphadenopathy or easy bruising Neurological:Denies numbness, tingling or new weaknesses Behavioral/Psych: Mood is stable, no new changes  All other systems were reviewed with the patient and are negative.  I have reviewed the past medical history, past surgical history, social history and family history with the patient and they are unchanged from previous note.  ALLERGIES:  is allergic to velcade and zometa.  MEDICATIONS:  Current Outpatient Prescriptions  Medication Sig Dispense Refill  . acyclovir (ZOVIRAX) 800 MG tablet TK 1 T PO BID  2  . CALCIUM PO Take 1,000 mg by mouth daily.    . Cholecalciferol (VITAMIN D3) 2000 UNITS TABS Take 1 tablet by mouth daily.     . folic acid (FOLVITE) 1 MG tablet TAKE 1 TABLET BY MOUTH EVERY DAY 30 tablet 0  . insulin glargine (LANTUS) 100 UNIT/ML injection Inject 5-6 Units into the skin at bedtime. Reported on 07/04/2015    . lenalidomide (REVLIMID) 5 MG capsule Take 1 capsule daily for 21 days, off 7 days 21 capsule 0  . metFORMIN (GLUCOPHAGE) 500 MG tablet Take 500 mg by mouth 2 (two) times daily with a meal. Reported on 07/04/2015    . Multiple Vitamin (MULTIVITAMIN) tablet Take 1 tablet by mouth daily.    . pantoprazole (PROTONIX) 40 MG tablet Take 40 mg by mouth.    . potassium chloride (MICRO-K) 10 MEQ CR capsule Take 10 mEq by mouth 2 (two) times daily.     . pravastatin (PRAVACHOL) 40 MG tablet Reported on 07/04/2015  0  . prochlorperazine (COMPAZINE) 10 MG tablet Take 10 mg by mouth. Reported on 07/04/2015     No current facility-administered medications for this visit.    PHYSICAL EXAMINATION: ECOG PERFORMANCE STATUS: 1 - Symptomatic but completely ambulatory  Filed Vitals:   07/18/15 1309  BP: 137/79  Pulse: 73  Temp:  98.4 F (36.9 C)  Resp: 18   Filed Weights   07/18/15 1309  Weight: 209 lb (94.802 kg)    GENERAL:alert, no distress and comfortable SKIN: skin color, texture, turgor are normal, no rashes or significant lesions EYES: normal, Conjunctiva are pink and non-injected, sclera clear OROPHARYNX:no exudate, no erythema and lips, buccal mucosa, and tongue normal  NECK: supple, thyroid normal size, non-tender, without nodularity LYMPH:  no palpable lymphadenopathy in the cervical, axillary or inguinal LUNGS: clear to auscultation and percussion with normal breathing effort HEART: regular rate & rhythm and no murmurs and no lower extremity edema ABDOMEN:abdomen soft, non-tender and normal bowel sounds Musculoskeletal:no cyanosis of digits and no clubbing  NEURO: alert & oriented x 3 with fluent speech, no focal motor/sensory deficits  LABORATORY DATA:  I have reviewed the data as listed    Component Value Date/Time   NA 139 07/11/2015 1250   NA 139 05/27/2014 1000   K 3.9 07/11/2015 1250   K 3.3* 05/27/2014 1000   CL 107 05/27/2014 1000   CO2 23 07/11/2015 1250   CO2 27 05/27/2014 1000   GLUCOSE 129 07/11/2015 1250   GLUCOSE 127* 05/27/2014 1000   BUN 18.2 07/11/2015 1250   BUN 12 05/27/2014 1000   CREATININE 1.5* 07/11/2015 1250   CREATININE 1.10 05/27/2014 1000   CALCIUM 9.1 07/11/2015 1250   CALCIUM 8.6 05/27/2014 1000  PROT 6.3 07/11/2015 1250   PROT 7.1 07/11/2015 1250   PROT 7.9 05/27/2014 1000   ALBUMIN 4.1 07/11/2015 1250   ALBUMIN 3.7 05/27/2014 1000   AST 36* 07/11/2015 1250   AST 37 05/27/2014 1000   ALT 57* 07/11/2015 1250   ALT 32 05/27/2014 1000   ALKPHOS 50 07/11/2015 1250   ALKPHOS 47 05/27/2014 1000   BILITOT 1.20 07/11/2015 1250   BILITOT 0.5 05/27/2014 1000   GFRNONAA 70* 05/27/2014 1000   GFRAA 81* 05/27/2014 1000    No results found for: SPEP, UPEP  Lab Results  Component Value Date   WBC 5.7 07/11/2015   NEUTROABS 2.4 07/11/2015   HGB 13.4  07/11/2015   HCT 40.3 07/11/2015   MCV 90.2 07/11/2015   PLT 188 07/11/2015      Chemistry      Component Value Date/Time   NA 139 07/11/2015 1250   NA 139 05/27/2014 1000   K 3.9 07/11/2015 1250   K 3.3* 05/27/2014 1000   CL 107 05/27/2014 1000   CO2 23 07/11/2015 1250   CO2 27 05/27/2014 1000   BUN 18.2 07/11/2015 1250   BUN 12 05/27/2014 1000   CREATININE 1.5* 07/11/2015 1250   CREATININE 1.10 05/27/2014 1000      Component Value Date/Time   CALCIUM 9.1 07/11/2015 1250   CALCIUM 8.6 05/27/2014 1000   ALKPHOS 50 07/11/2015 1250   ALKPHOS 47 05/27/2014 1000   AST 36* 07/11/2015 1250   AST 37 05/27/2014 1000   ALT 57* 07/11/2015 1250   ALT 32 05/27/2014 1000   BILITOT 1.20 07/11/2015 1250   BILITOT 0.5 05/27/2014 1000      ASSESSMENT & PLAN:  Multiple myeloma in remission ([place]) Unfortunately, the patient developed skin rash. The appearance is classic for Revlimid-induced skin rash. His skin is improving after discontinuation of Revlimid. He tolerated lower dose of Revlimid well. Recent blood work showed that he remained in remission I plan to see him back in 3 months. He will return here for monthly blood work and Zometa. He will continue [facility]. He will continue aspirin for DVT prophylaxis and continues on vitamin D and calcium supplement. I recommend close follow-up with dentist for dental checkup. He is no signs and symptoms to suggest osteonecrosis of the jaw.  S/P bone marrow transplant [facility]) The patient is returning to [facility] next month for his 6 months post transplant visit and vaccination. We'll continue close monitoring here for posttransplant related complications He will continue antimicrobial prophylaxis as directed.  Elevated serum creatinine He had elevated serum creatinine likely due to mild dehydration. I will reduce the dose of Zometa. I recommend increase oral fluid intake  Diabetes  mellitus without complication The patient had discontinued insulin treatment along with metformin. Although he is monitoring his blood sugar carefully, he is at risk of relapse of difficult to control diabetes I recommend he resume metformin only without insulin.   No orders of the defined types were placed in this encounter.   All questions were answered. The patient knows to call the clinic with any problems, questions or concerns. No barriers to learning was detected. I spent 20 minutes counseling the patient face to face. The total time spent in the appointment was 30 minutes and more than 50% was on counseling and review of test results     [facility] [place] [facility], MD 07/18/2015 4:17 PM

## 2015-07-18 NOTE — Assessment & Plan Note (Signed)
He had elevated serum creatinine likely due to mild dehydration. I will reduce the dose of Zometa. I recommend increase oral fluid intake

## 2015-07-18 NOTE — Telephone Encounter (Signed)
Gave pt appt & avs °

## 2015-07-18 NOTE — Assessment & Plan Note (Signed)
The patient is returning to [facility] next month for his 6 months post transplant visit and vaccination. We'll continue close monitoring here for posttransplant related complications He will continue antimicrobial prophylaxis as directed.

## 2015-07-18 NOTE — Assessment & Plan Note (Signed)
Unfortunately, the patient developed skin rash. The appearance is classic for Revlimid-induced skin rash. His skin is improving after discontinuation of Revlimid. He tolerated lower dose of Revlimid well. Recent blood work showed that he remained in remission I plan to see him back in 3 months. He will return here for monthly blood work and Zometa. He will continue antimicrobial prophylaxis as directed [facility]. He will continue aspirin for DVT prophylaxis and continues on vitamin D and calcium supplement. I recommend close follow-up with dentist for dental checkup. He is no signs and symptoms to suggest osteonecrosis of the jaw.

## 2015-07-18 NOTE — Patient Instructions (Signed)

## 2015-07-18 NOTE — Telephone Encounter (Signed)
Added appt pt will get sched in chemo °

## 2015-07-18 NOTE — Assessment & Plan Note (Signed)
The patient had discontinued insulin treatment along with metformin. Although he is monitoring his blood sugar carefully, he is at risk of relapse of difficult to control diabetes I recommend he resume metformin only without insulin.

## 2015-07-31 ENCOUNTER — Other Ambulatory Visit: Payer: Self-pay | Admitting: *Deleted

## 2015-07-31 MED ORDER — LENALIDOMIDE 5 MG PO CAPS: ORAL_CAPSULE | ORAL | Status: DC

## 2015-08-15 ENCOUNTER — Other Ambulatory Visit (HOSPITAL_BASED_OUTPATIENT_CLINIC_OR_DEPARTMENT_OTHER): Payer: BC Managed Care – PPO

## 2015-08-15 ENCOUNTER — Ambulatory Visit (HOSPITAL_BASED_OUTPATIENT_CLINIC_OR_DEPARTMENT_OTHER): Payer: BC Managed Care – PPO

## 2015-08-15 DIAGNOSIS — C9001 Multiple myeloma in remission: Secondary | ICD-10-CM

## 2015-08-15 LAB — CBC WITH DIFFERENTIAL/PLATELET
BASO%: 1.1 % (ref 0.0–2.0)
BASOS ABS: 0.1 10*3/uL (ref 0.0–0.1)
EOS%: 7 % (ref 0.0–7.0)
Eosinophils Absolute: 0.4 10*3/uL (ref 0.0–0.5)
HEMATOCRIT: 41.3 % (ref 38.4–49.9)
HEMOGLOBIN: 14 g/dL (ref 13.0–17.1)
LYMPH#: 2.1 10*3/uL (ref 0.9–3.3)
LYMPH%: 36.8 % (ref 14.0–49.0)
MCH: 30.7 pg (ref 27.2–33.4)
MCHC: 33.9 g/dL (ref 32.0–36.0)
MCV: 90.6 fL (ref 79.3–98.0)
MONO#: 0.6 10*3/uL (ref 0.1–0.9)
MONO%: 10.7 % (ref 0.0–14.0)
NEUT%: 44.4 % (ref 39.0–75.0)
NEUTROS ABS: 2.5 10*3/uL (ref 1.5–6.5)
Platelets: 164 10*3/uL (ref 140–400)
RBC: 4.56 10*6/uL (ref 4.20–5.82)
RDW: 16.2 % — AB (ref 11.0–14.6)
WBC: 5.7 10*3/uL (ref 4.0–10.3)

## 2015-08-15 LAB — COMPREHENSIVE METABOLIC PANEL
ALBUMIN: 4.2 g/dL (ref 3.5–5.0)
ALK PHOS: 53 U/L (ref 40–150)
ALT: 71 U/L — AB (ref 0–55)
AST: 36 U/L — AB (ref 5–34)
Anion Gap: 10 mEq/L (ref 3–11)
BILIRUBIN TOTAL: 1.25 mg/dL — AB (ref 0.20–1.20)
BUN: 13.8 mg/dL (ref 7.0–26.0)
CALCIUM: 9.5 mg/dL (ref 8.4–10.4)
CO2: 23 mEq/L (ref 22–29)
CREATININE: 1.5 mg/dL — AB (ref 0.7–1.3)
Chloride: 106 mEq/L (ref 98–109)
EGFR: 49 mL/min/{1.73_m2} — ABNORMAL LOW (ref >90)
GLUCOSE: 138 mg/dL (ref 70–140)
POTASSIUM: 3.9 meq/L (ref 3.5–5.1)
SODIUM: 138 meq/L (ref 136–145)
TOTAL PROTEIN: 7.3 g/dL (ref 6.4–8.3)

## 2015-08-15 MED ORDER — ZOLEDRONIC ACID 4 MG/5ML IV CONC
3.5000 mg | Freq: Once | INTRAVENOUS | Status: AC
  Administered 2015-08-15: 3.5 mg via INTRAVENOUS
  Filled 2015-08-15: qty 4.38

## 2015-08-15 NOTE — Patient Instructions (Signed)

## 2015-08-30 ENCOUNTER — Telehealth: Payer: Self-pay | Admitting: *Deleted

## 2015-08-30 NOTE — Telephone Encounter (Signed)
"  I need a refill for Revlimid to restart on September 06, 2015.  Will need a two month supply.  My wife is from [place], I tentively plan to go to [place] from the end of June to the middle of August.  I will not be here for F/U and Zometa on July 20th either so rescheduled me for mid August."  [facility] allows a 28-day supply so he will need to keep mobile number charged, remembering the 7 hr time difference when calling [facility] for refills and [employer] survey.  [facility] will call to arrange shipment and he needs to provide address in [place].  Arrange order and shipment as early as possible (7 days before) the next cycle which will be July, 13, 2017.

## 2015-08-30 NOTE — Telephone Encounter (Signed)
Received live person at [facility].  They may NOT [employer].  A note will be placed  On his file that he is traveling to [place] so he can receive this shipment before he leaves for [place] because it can't be shipped To [place].  Called patient notifying him of [facility] stand for shipment of the July order.

## 2015-09-03 ENCOUNTER — Other Ambulatory Visit: Payer: Self-pay | Admitting: *Deleted

## 2015-09-03 MED ORDER — LENALIDOMIDE 5 MG PO CAPS: ORAL_CAPSULE | ORAL | Status: DC

## 2015-09-04 ENCOUNTER — Encounter (HOSPITAL_BASED_OUTPATIENT_CLINIC_OR_DEPARTMENT_OTHER): Payer: Self-pay

## 2015-09-04 ENCOUNTER — Telehealth: Payer: Self-pay | Admitting: *Deleted

## 2015-09-04 ENCOUNTER — Encounter: Payer: Self-pay | Admitting: Hematology and Oncology

## 2015-09-04 ENCOUNTER — Ambulatory Visit (HOSPITAL_BASED_OUTPATIENT_CLINIC_OR_DEPARTMENT_OTHER): Payer: BC Managed Care – PPO | Admitting: Hematology and Oncology

## 2015-09-04 ENCOUNTER — Ambulatory Visit (HOSPITAL_BASED_OUTPATIENT_CLINIC_OR_DEPARTMENT_OTHER)
Admission: RE | Admit: 2015-09-04 | Discharge: 2015-09-04 | Disposition: A | Discharge status: Home / Self Care | Payer: BC Managed Care – PPO | Source: Ambulatory Visit | Attending: Hematology and Oncology | Admitting: Hematology and Oncology

## 2015-09-04 ENCOUNTER — Other Ambulatory Visit: Payer: Self-pay | Admitting: Hematology and Oncology

## 2015-09-04 VITALS — BP 123/70 | HR 66 | Temp 97.9°F | Resp 19 | Ht 72.0 in | Wt 208.5 lb

## 2015-09-04 DIAGNOSIS — C9001 Multiple myeloma in remission: Secondary | ICD-10-CM

## 2015-09-04 DIAGNOSIS — I83811 Varicose veins of right lower extremities with pain: Secondary | ICD-10-CM | POA: Diagnosis not present

## 2015-09-04 DIAGNOSIS — M79604 Pain in right leg: Secondary | ICD-10-CM | POA: Diagnosis present

## 2015-09-04 MED ORDER — RIVAROXABAN (XARELTO) VTE STARTER PACK (15 & 20 MG): ORAL_TABLET | ORAL | Status: DC

## 2015-09-04 NOTE — Telephone Encounter (Signed)
Pt LVM for nurse reporting new pain in the "upper, inside part, near my knee."  States started this morning and the pain comes and goes.  He did not specify which leg.  He is concerned about blood clot.  Last time he had leg pain on his right lower leg,  The NP ordered a Doppler US to r/o DVT.  He did not have DVT at that time.  But since he has leg pain again he wonders if he needs another [place]? Called pt back and left VM to get more information.  Asked him to return nurse's call.

## 2015-09-04 NOTE — Telephone Encounter (Signed)
Pt returned call reports intermittent pains since early this morning right upper inner leg.  Dr. Alvy Bimler instructs pt to come into office now and pt verbalized understanding and is on his way.

## 2015-09-04 NOTE — Assessment & Plan Note (Signed)
The patient had numerous complaints in the past regarding right leg pain and had venous Doppler ultrasound in April which excluded DVT. On examination, he has significant bilateral leg varicose veins and I suspect he may have acute superficial thrombophlebitis. Having said that, he is on Revlimid. 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 This would put him at extremely high risk of DVT. I proceed to order urgent venous Doppler ultrasound immediately. I have given him a prescription of Xarelto starting kit along with co-pay assistance card in case the venous Doppler confirmed the diagnosis of DVT. However, if the patient has no signs of DVT, I suspect his pain is likely due to superficial thrombophlebitis. In that situation, I will recommend he takes 325 mg aspirin daily.

## 2015-09-04 NOTE — Assessment & Plan Note (Signed)
He tolerated lower dose of Revlimid well. Recent blood work showed that he remained in remission I plan to see him back next month for further review. He will return here for monthly blood work and Zometa. He will continue antimicrobial prophylaxis as [facility]. He will continue on vitamin D and calcium supplement.

## 2015-09-04 NOTE — Progress Notes (Signed)
[facility] OFFICE PROGRESS NOTE  Patient Care Team: Hulan Fess, MD as PCP - General (Family Medicine) Heath Lark, MD as Consulting Physician (Hematology and Oncology) Truman Hayward, MD as Consulting Physician (Infectious Diseases) Garry Heater, DO as Referring Physician (Hematology)  SUMMARY OF ONCOLOGIC HISTORY: Oncology History   ISS Staging II, M-spike 1.7, IgG 2420, kappa 30.1, beta19mcroglobulin 4.48, Creatinine 1.2, calcium 9.8, hemoglobin 13.2, albumin 4.2     [facility] myeloma in remission ([facility]   01/10/2003 Pathology Results [id number] pathology showed pure seminoma with lymphovascular invasion, pT2 NX MX   01/10/2003 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 congo red staing reveals amyloid deposition within some of the amorphous material and within blood vessels. Normal cytogenetics.   02/14/2014 Imaging PET CT scan showed diffuse skeletal involvement   02/26/2014 - 04/25/2014 Chemotherapy He is started on chemotherapy with Velcade, Cytoxan and dexamethasone. Cytoxan was discontinued due to recurrent admission and recent clostridium Diff infection.  Velcade was suspected to be a cause of fever that was discontinued.   03/09/2014 - 03/11/2014 Hospital Admission He was admitted to the hospital with fevers and chills and was diagnosed with pneumonia   03/22/2014 - 03/26/2014 Hospital Admission He is admitted to the hospital again for fever, chills and possible pneumonia   04/19/2014 - 04/22/2014 Hospital  Admission He is admitted to the hospital for fever, chills and diarrhea and was subsequently found to have C. difficile infection   05/04/2014 - 05/05/2014 Hospital Admission The patient was admitted to the hospital again with fever and chills. No source of infection was found and he was not given antibiotics.   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   06/04/2014 - 06/14/2014 Chemotherapy He was restarted back on Revlimid with dexamethasone only. 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 Revlimid is on hold pending further evaluation for amyloidosis   04/17/2015 [facility]   05[facility]/2015 [facility]   05/29/2015 Adverse Reaction He developed skin rash. Treatment was placed on hold    INTERVAL HISTORY: Please see below for problem oriented charting. He is seen urgently today because of complaints of acute onset of right leg pain behind the right calf. [facility] This happened acutely this morning at 4 am. He denies recent travel, injury or recent surgeries. The patient is taking Revlimid  as part of his maintenance treatment after this transplant. He confirms he had not been taking aspirin as directed since his bone  marrow transplant. He denies chest pain or shortness of breath. He has started to use his TED hose bilaterally due to intermittent bilateral leg "achiness" pain that comes and goes in the summertime.  REVIEW OF SYSTEMS:   Constitutional: Denies fevers, chills or abnormal weight loss Eyes: Denies blurriness of vision Ears, nose, mouth, throat, and face: Denies mucositis or sore throat Respiratory: Denies cough, dyspnea or wheezes Cardiovascular: Denies palpitation, chest discomfort or lower extremity swelling Gastrointestinal:  Denies nausea, heartburn or change in bowel habits Skin: Denies abnormal skin rashes Lymphatics: Denies new lymphadenopathy or easy bruising Neurological:Denies numbness, tingling or new weaknesses Behavioral/Psych: Mood is stable, no new changes  All other systems were reviewed with the patient and are negative.  I have reviewed the past medical history, past surgical history, social history and family history with the patient and they are unchanged from previous note.  ALLERGIES:  is allergic to velcade and zometa.  MEDICATIONS:  Current Outpatient Prescriptions  Medication Sig Dispense Refill  . acyclovir (ZOVIRAX) 800 MG tablet TK 1 T PO BID  2  . CALCIUM PO Take 1,000 mg by mouth daily.    . Cholecalciferol (VITAMIN D3) 2000 UNITS TABS Take 1 tablet by mouth daily.     . folic acid (FOLVITE) 1 MG tablet TAKE 1 TABLET BY MOUTH EVERY DAY 30 tablet 0  . insulin glargine (LANTUS) 100 UNIT/ML injection Inject 5-6 Units into the skin at bedtime. Reported on 07/04/2015    . lenalidomide (REVLIMID) 5 MG capsule Take 1 capsule daily for 21 days, off 7 days 21 capsule 0  . metFORMIN (GLUCOPHAGE) 500 MG tablet Take 500 mg by mouth 2 (two) times daily with a meal. Reported on 07/04/2015    . Multiple Vitamin (MULTIVITAMIN) tablet Take 1 tablet by mouth daily.    . pantoprazole (PROTONIX) 40 MG tablet Take 40 mg by mouth.    . potassium chloride (MICRO-K) 10 MEQ CR  capsule Take 10 mEq by mouth 2 (two) times daily.     . pravastatin (PRAVACHOL) 40 MG tablet Reported on 07/04/2015  0  . prochlorperazine (COMPAZINE) 10 MG tablet Take 10 mg by mouth. Reported on 07/04/2015    . Rivaroxaban 15 & 20 MG TBPK Take as directed on package: Start with one 77m tablet by mouth twice a day with food. On Day 22, switch to one 233mtablet once a day with food. 51 each 0   No current facility-administered medications for this visit.    PHYSICAL EXAMINATION: ECOG PERFORMANCE STATUS: 1 - Symptomatic but completely ambulatory  Filed Vitals:   09/04/15 1604  BP: 123/70  Pulse: 66  Temp: 97.9 F (36.6 C)  Resp: 19   Filed Weights   09/04/15 1604  Weight: 208 lb 8 oz (94.575 kg)    GENERAL:alert, no distress and comfortable SKIN: skin color, texture, turgor are normal, no rashes or significant lesions EYES: normal, Conjunctiva are pink and non-injected, sclera clear OROPHARYNX:no exudate, no erythema and lips, buccal mucosa, and tongue normal  NECK: supple, thyroid normal size, non-tender, without nodularity LYMPH:  no palpable lymphadenopathy in the cervical, axillary or inguinal LUNGS: clear to auscultation and percussion with normal breathing effort HEART: regular rate & rhythm and no murmurs and no lower extremity edema. Noted bilateral varicose vein. ABDOMEN:abdomen soft, non-tender and normal bowel sounds Musculoskeletal:no cyanosis of digits and  no clubbing  NEURO: alert & oriented x 3 with fluent speech, no focal motor/sensory deficits  LABORATORY DATA:  I have reviewed the data as listed    Component Value Date/Time   NA 138 08/15/2015 1405   NA 139 05/27/2014 1000   K 3.9 08/15/2015 1405   K 3.3* 05/27/2014 1000   CL 107 05/27/2014 1000   CO2 23 08/15/2015 1405   CO2 27 05/27/2014 1000   GLUCOSE 138 08/15/2015 1405   GLUCOSE 127* 05/27/2014 1000   BUN 13.8 08/15/2015 1405   BUN 12 05/27/2014 1000   CREATININE 1.5* 08/15/2015 1405    CREATININE 1.10 05/27/2014 1000   CALCIUM 9.5 08/15/2015 1405   CALCIUM 8.6 05/27/2014 1000   PROT 7.3 08/15/2015 1405   PROT 6.3 07/11/2015 1250   PROT 7.9 05/27/2014 1000   ALBUMIN 4.2 08/15/2015 1405   ALBUMIN 3.7 05/27/2014 1000   AST 36* 08/15/2015 1405   AST 37 05/27/2014 1000   ALT 71* 08/15/2015 1405   ALT 32 05/27/2014 1000   ALKPHOS 53 08/15/2015 1405   ALKPHOS 47 05/27/2014 1000   BILITOT 1.25* 08/15/2015 1405   BILITOT 0.5 05/27/2014 1000   GFRNONAA 70* 05/27/2014 1000   GFRAA 81* 05/27/2014 1000    No results found for: SPEP, UPEP  Lab Results  Component Value Date   WBC 5.7 08/15/2015   NEUTROABS 2.5 08/15/2015   HGB 14.0 08/15/2015   HCT 41.3 08/15/2015   MCV 90.6 08/15/2015   PLT 164 08/15/2015      Chemistry      Component Value Date/Time   NA 138 08/15/2015 1405   NA 139 05/27/2014 1000   K 3.9 08/15/2015 1405   K 3.3* 05/27/2014 1000   CL 107 05/27/2014 1000   CO2 23 08/15/2015 1405   CO2 27 05/27/2014 1000   BUN 13.8 08/15/2015 1405   BUN 12 05/27/2014 1000   CREATININE 1.5* 08/15/2015 1405   CREATININE 1.10 05/27/2014 1000      Component Value Date/Time   CALCIUM 9.5 08/15/2015 1405   CALCIUM 8.6 05/27/2014 1000   ALKPHOS 53 08/15/2015 1405   ALKPHOS 47 05/27/2014 1000   AST 36* 08/15/2015 1405   AST 37 05/27/2014 1000   ALT 71* 08/15/2015 1405   ALT 32 05/27/2014 1000   BILITOT 1.25* 08/15/2015 1405   BILITOT 0.5 05/27/2014 1000      ASSESSMENT & PLAN:  Multiple myeloma in remission ([place]) He tolerated lower dose of Revlimid well. Recent blood work showed that he remained in remission I plan to see him back next month for further review. He will return here for monthly blood work and Zometa. He will continue antimicrobial prophylaxis as [facility]. He will continue on vitamin D and calcium supplement.  Varicose veins of right lower extremity with pain The patient had numerous complaints in the past regarding  right leg pain and had venous Doppler ultrasound in April which excluded DVT. On examination, he has significant bilateral leg varicose veins and I suspect he may have acute superficial thrombophlebitis. Having said that, he is on Revlimid. 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 This would put him at extremely high risk of DVT. I proceed to order urgent venous Doppler ultrasound immediately. I have given him a  prescription of Xarelto starting kit along with co-pay assistance card in case the venous Doppler confirmed the diagnosis of DVT. However, if the patient has no signs of DVT, I suspect his pain is likely due to superficial thrombophlebitis. In that situation, I will recommend he takes 325 mg aspirin daily.   Orders Placed This Encounter  Procedures  . US Venous Img Lower Unilateral Right    Standing Status: Future     Number of Occurrences:      Standing Expiration Date: 11/03/2016    Order Specific Question:  Reason for Exam (SYMPTOM  OR DIAGNOSIS REQUIRED)    Answer:  right leg pain, on chemo for myeloma, exclude DVT    Order Specific Question:  Preferred imaging location?    Answer:  [facility]    Order Specific Question:  Call Results- Best Contact Number?    Answer:  [phone]   All questions were answered. The patient knows to call the clinic with any problems, questions or concerns. No barriers to learning was detected. I spent 20 minutes counseling the patient face to face. The total time spent in the appointment was 30 minutes and more than 50% was on counseling and review of test results     [facility], [place], MD 09/04/2015 4:40 PM

## 2015-09-05 ENCOUNTER — Encounter: Payer: Self-pay | Admitting: Hematology and Oncology

## 2015-09-05 ENCOUNTER — Other Ambulatory Visit: Payer: Self-pay | Admitting: Hematology and Oncology

## 2015-09-12 ENCOUNTER — Ambulatory Visit (HOSPITAL_BASED_OUTPATIENT_CLINIC_OR_DEPARTMENT_OTHER): Payer: BC Managed Care – PPO

## 2015-09-12 ENCOUNTER — Telehealth: Payer: Self-pay | Admitting: *Deleted

## 2015-09-12 ENCOUNTER — Other Ambulatory Visit (HOSPITAL_BASED_OUTPATIENT_CLINIC_OR_DEPARTMENT_OTHER): Payer: BC Managed Care – PPO

## 2015-09-12 VITALS — BP 124/74 | HR 75 | Temp 98.4°F | Resp 18

## 2015-09-12 DIAGNOSIS — C9001 Multiple myeloma in remission: Secondary | ICD-10-CM

## 2015-09-12 LAB — COMPREHENSIVE METABOLIC PANEL
ALK PHOS: 53 U/L (ref 40–150)
ALT: 57 U/L — AB (ref 0–55)
AST: 31 U/L (ref 5–34)
Albumin: 3.9 g/dL (ref 3.5–5.0)
Anion Gap: 9 mEq/L (ref 3–11)
BILIRUBIN TOTAL: 1.17 mg/dL (ref 0.20–1.20)
BUN: 14.1 mg/dL (ref 7.0–26.0)
CALCIUM: 9.4 mg/dL (ref 8.4–10.4)
CO2: 21 mEq/L — ABNORMAL LOW (ref 22–29)
CREATININE: 1.5 mg/dL — AB (ref 0.7–1.3)
Chloride: 109 mEq/L (ref 98–109)
EGFR: 50 mL/min/{1.73_m2} — AB (ref >90)
GLUCOSE: 138 mg/dL (ref 70–140)
Potassium: 3.9 mEq/L (ref 3.5–5.1)
Sodium: 139 mEq/L (ref 136–145)
TOTAL PROTEIN: 7.4 g/dL (ref 6.4–8.3)

## 2015-09-12 LAB — CBC WITH DIFFERENTIAL/PLATELET
BASO%: 0.9 % (ref 0.0–2.0)
Basophils Absolute: 0 10*3/uL (ref 0.0–0.1)
EOS%: 6.6 % (ref 0.0–7.0)
Eosinophils Absolute: 0.4 10*3/uL (ref 0.0–0.5)
HEMATOCRIT: 41.1 % (ref 38.4–49.9)
HEMOGLOBIN: 13.6 g/dL (ref 13.0–17.1)
LYMPH#: 1.6 10*3/uL (ref 0.9–3.3)
LYMPH%: 27.9 % (ref 14.0–49.0)
MCH: 30.4 pg (ref 27.2–33.4)
MCHC: 33.2 g/dL (ref 32.0–36.0)
MCV: 91.7 fL (ref 79.3–98.0)
MONO#: 0.6 10*3/uL (ref 0.1–0.9)
MONO%: 10.7 % (ref 0.0–14.0)
NEUT%: 53.9 % (ref 39.0–75.0)
NEUTROS ABS: 3 10*3/uL (ref 1.5–6.5)
PLATELETS: 199 10*3/uL (ref 140–400)
RBC: 4.48 10*6/uL (ref 4.20–5.82)
RDW: 17.3 % — AB (ref 11.0–14.6)
WBC: 5.6 10*3/uL (ref 4.0–10.3)

## 2015-09-12 MED ORDER — ZOLEDRONIC ACID 4 MG/5ML IV CONC
3.5000 mg | Freq: Once | INTRAVENOUS | Status: AC
  Administered 2015-09-12: 3.5 mg via INTRAVENOUS
  Filled 2015-09-12: qty 4.38

## 2015-09-12 MED ORDER — SODIUM CHLORIDE 0.9 % IJ SOLN
10.0000 mL | INTRAMUSCULAR | Status: DC | PRN
  Filled 2015-09-12: qty 10

## 2015-09-12 NOTE — Patient Instructions (Signed)

## 2015-09-12 NOTE — Telephone Encounter (Signed)
Pt requesting renewal for [employer] parking placard.  Form signed by Dr. Alvy Bimler and given to pt..   Pt also states he is traveling to [place] on 6/29 thru 8/11. He requests a letter stating he is on medications for his cancer and diabetes so he can bring his medications through security checkpoints.  He will need to get his Rx Revlimid delivered early so he has enough to take on his trip.  He is also going to cancel his appts for July and would like to r/s to after he returns after 8/11.

## 2015-09-13 MED ORDER — LENALIDOMIDE 5 MG PO CAPS: ORAL_CAPSULE | ORAL | Status: DC

## 2015-09-13 NOTE — Telephone Encounter (Signed)
Letter stating pt on medications for cancer and diabetes left for Dr. Calton Dach signature.

## 2015-09-13 NOTE — Telephone Encounter (Signed)
Obtained [employer] Auth number for early refill of Revlimid and e-scribed Rev[facility]id to [facility] for early refill.   Pt started current cycle of Revlimid on 6/16 and due to start next cycle on 7/14 while he is in [place].   He will be back in town on 8/11 at which time he is due to start the following cycle.

## 2015-09-16 ENCOUNTER — Telehealth: Payer: Self-pay | Admitting: Hematology and Oncology

## 2015-09-16 ENCOUNTER — Telehealth: Payer: Self-pay | Admitting: *Deleted

## 2015-09-16 ENCOUNTER — Other Ambulatory Visit: Payer: Self-pay | Admitting: Hematology and Oncology

## 2015-09-16 DIAGNOSIS — C9001 Multiple myeloma in remission: Secondary | ICD-10-CM

## 2015-09-16 NOTE — Telephone Encounter (Signed)
Pt called to ask if his Revlimid is ready to be delivered? Informed pt I sent early refill to [facility] on Friday.  Gave him phone number to call [facility] to arrange delivery.  Pt leaving country Thursday morning.  Also informed him letter ready to pick up at front desk stating he needs to travel with his medications.  Pt verbalized understanding.

## 2015-09-16 NOTE — Telephone Encounter (Signed)
s.w. pt and advised on Aug appt....pt ok and aware °

## 2015-09-17 ENCOUNTER — Encounter: Payer: Self-pay | Admitting: Hematology and Oncology

## 2015-09-17 NOTE — Progress Notes (Signed)
Per [facility] revlimid shipped 09/16/15 via fed exp

## 2015-09-18 ENCOUNTER — Telehealth: Payer: Self-pay | Admitting: *Deleted

## 2015-09-18 NOTE — Telephone Encounter (Signed)
Pt called, needs Dr. Alvy Bimler [place] License number for Handicap parking application.   Provided License number to pt.Tyler Willis

## 2015-10-03 ENCOUNTER — Other Ambulatory Visit: Payer: BC Managed Care – PPO

## 2015-10-10 ENCOUNTER — Ambulatory Visit: Payer: BC Managed Care – PPO | Admitting: Hematology and Oncology

## 2015-10-10 ENCOUNTER — Ambulatory Visit: Payer: BC Managed Care – PPO

## 2015-10-25 ENCOUNTER — Other Ambulatory Visit: Payer: Self-pay | Admitting: *Deleted

## 2015-10-25 MED ORDER — LENALIDOMIDE 5 MG PO CAPS: ORAL_CAPSULE | ORAL | 0 refills | Status: DC

## 2015-11-04 ENCOUNTER — Ambulatory Visit (HOSPITAL_BASED_OUTPATIENT_CLINIC_OR_DEPARTMENT_OTHER): Payer: BC Managed Care – PPO

## 2015-11-04 ENCOUNTER — Ambulatory Visit (HOSPITAL_COMMUNITY)
Admission: RE | Admit: 2015-11-04 | Discharge: 2015-11-04 | Disposition: A | Discharge status: Home / Self Care | Payer: BC Managed Care – PPO | Source: Ambulatory Visit | Attending: Hematology and Oncology | Admitting: Hematology and Oncology

## 2015-11-04 ENCOUNTER — Other Ambulatory Visit: Payer: BC Managed Care – PPO

## 2015-11-04 ENCOUNTER — Ambulatory Visit (HOSPITAL_BASED_OUTPATIENT_CLINIC_OR_DEPARTMENT_OTHER): Payer: BC Managed Care – PPO | Admitting: Hematology and Oncology

## 2015-11-04 ENCOUNTER — Telehealth: Payer: Self-pay | Admitting: *Deleted

## 2015-11-04 ENCOUNTER — Encounter: Payer: Self-pay | Admitting: Hematology and Oncology

## 2015-11-04 ENCOUNTER — Telehealth: Payer: Self-pay

## 2015-11-04 VITALS — BP 90/74 | HR 86 | Temp 98.3°F | Resp 20 | Ht 72.0 in | Wt 197.4 lb

## 2015-11-04 DIAGNOSIS — R05 Cough: Secondary | ICD-10-CM

## 2015-11-04 DIAGNOSIS — R197 Diarrhea, unspecified: Secondary | ICD-10-CM

## 2015-11-04 DIAGNOSIS — Z9481 Bone marrow transplant status: Secondary | ICD-10-CM

## 2015-11-04 DIAGNOSIS — C9001 Multiple myeloma in remission: Secondary | ICD-10-CM

## 2015-11-04 DIAGNOSIS — R059 Cough, unspecified: Secondary | ICD-10-CM

## 2015-11-04 LAB — CBC WITH DIFFERENTIAL/PLATELET
BASO%: 1.9 % (ref 0.0–2.0)
BASOS ABS: 0.1 10*3/uL (ref 0.0–0.1)
EOS ABS: 0.3 10*3/uL (ref 0.0–0.5)
EOS%: 5.2 % (ref 0.0–7.0)
HEMATOCRIT: 40.9 % (ref 38.4–49.9)
HGB: 13.5 g/dL (ref 13.0–17.1)
LYMPH#: 1.3 10*3/uL (ref 0.9–3.3)
LYMPH%: 24.5 % (ref 14.0–49.0)
MCH: 30.7 pg (ref 27.2–33.4)
MCHC: 33 g/dL (ref 32.0–36.0)
MCV: 93.1 fL (ref 79.3–98.0)
MONO#: 1.4 10*3/uL — AB (ref 0.1–0.9)
MONO%: 24.8 % — ABNORMAL HIGH (ref 0.0–14.0)
NEUT#: 2.4 10*3/uL (ref 1.5–6.5)
NEUT%: 43.6 % (ref 39.0–75.0)
PLATELETS: 280 10*3/uL (ref 140–400)
RBC: 4.4 10*6/uL (ref 4.20–5.82)
RDW: 17 % — ABNORMAL HIGH (ref 11.0–14.6)
WBC: 5.5 10*3/uL (ref 4.0–10.3)

## 2015-11-04 LAB — COMPREHENSIVE METABOLIC PANEL
ALT: 29 U/L (ref 0–55)
AST: 17 U/L (ref 5–34)
Albumin: 3.4 g/dL — ABNORMAL LOW (ref 3.5–5.0)
Alkaline Phosphatase: 54 U/L (ref 40–150)
Anion Gap: 8 mEq/L (ref 3–11)
BUN: 13.1 mg/dL (ref 7.0–26.0)
CALCIUM: 9.7 mg/dL (ref 8.4–10.4)
CHLORIDE: 102 meq/L (ref 98–109)
CO2: 24 meq/L (ref 22–29)
CREATININE: 1.5 mg/dL — AB (ref 0.7–1.3)
EGFR: 51 mL/min/{1.73_m2} — ABNORMAL LOW (ref >90)
Glucose: 143 mg/dl — ABNORMAL HIGH (ref 70–140)
Potassium: 3.9 mEq/L (ref 3.5–5.1)
Sodium: 134 mEq/L — ABNORMAL LOW (ref 136–145)
Total Bilirubin: 0.96 mg/dL (ref 0.20–1.20)
Total Protein: 7.1 g/dL (ref 6.4–8.3)

## 2015-11-04 MED ORDER — ZOLEDRONIC ACID 4 MG/5ML IV CONC
3.5000 mg | Freq: Once | INTRAVENOUS | Status: AC
  Administered 2015-11-04: 3.5 mg via INTRAVENOUS
  Filled 2015-11-04: qty 4.38

## 2015-11-04 MED ORDER — SODIUM CHLORIDE 0.9 % IV SOLN
Freq: Once | INTRAVENOUS | Status: AC
  Administered 2015-11-04: 14:00:00 via INTRAVENOUS

## 2015-11-04 NOTE — Telephone Encounter (Signed)
-----   Message from Heath Lark, MD sent at 11/04/2015  3:58 PM EDT ----- Regarding: CXR normal Pls call his wife/pt CXR negative Recommend OTC cough syrup only for now ----- Message ----- From: Interface, Rad Results In Sent: 11/04/2015   3:45 PM To: Heath Lark, MD

## 2015-11-04 NOTE — Patient Instructions (Signed)

## 2015-11-04 NOTE — Telephone Encounter (Signed)
Pt notified of message below. Verbalized understanding 

## 2015-11-04 NOTE — Telephone Encounter (Signed)
Pt called stating he has a cough. He has labs today at 2 and was wanting to see someone. It is a nonproductive cough. He denies fever, denies n/v, has a little diarrhea.  He just returned from 6 week trip to [place].   He is also out of revlimid and is due to start.   S/w Dr Alvy Bimler at she will see him at 1 with lab about 1230. Pt made aware.

## 2015-11-05 LAB — KAPPA/LAMBDA LIGHT CHAINS
Ig Kappa Free Light Chain: 17.1 mg/L (ref 3.3–19.4)
Ig Lambda Free Light Chain: 13.7 mg/L (ref 5.7–26.3)
Kappa/Lambda FluidC Ratio: 1.25 (ref 0.26–1.65)

## 2015-11-05 NOTE — Progress Notes (Signed)
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 congo red staing reveals amyloid deposition within some of the amorphous material and within blood vessels. Normal cytogenetics.     02/14/2014 Imaging    PET CT scan showed diffuse skeletal involvement     02/26/2014 - 04/25/2014 Chemotherapy    He is started on chemotherapy with Velcade, Cytoxan and dexamethasone. Cytoxan was discontinued due to recurrent admission and recent clostridium Diff infection.  Velcade was suspected to be a cause of fever that was discontinued.     03/09/2014 - 03/11/2014 Hospital Admission    He was admitted to the hospital with fevers and chills and was diagnosed with pneumonia     03/22/2014 - 03/26/2014 Hospital Admission    He is admitted to the hospital again for fever, chills and  possible pneumonia     04/19/2014 - 04/22/2014 Hospital Admission    He is admitted to the hospital for fever, chills and diarrhea and was subsequently found to have C. difficile infection     05/04/2014 - 05/05/2014 Hospital Admission    The patient was admitted to the hospital again with fever and chills. No source of infection was found and he was not given antibiotics.     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     06/04/2014 - 06/14/2014 Chemotherapy    He was restarted back on Revlimid with dexamethasone only. Treatment was put on hold due to recurrent fever of unknown origin     01/14/2015 - 01/14/2015 Chemotherapy    He received high dose melphalan     01/15/2015 Bone Marrow Transplant    He received autologous BMT     01/22/2015 - 01/27/2015 Hospital Admission    [facility] & was found to have E Coli bacteremia     02/28/2015 -  Chemotherapy    He began maintenance Zometa only. Revlimid is on hold pending further evaluation for amyloidosis     04/17/2015 Bone Marrow Biopsy    [facility]     05/[facility]/2015 Bone Marrow Biopsy    [facility]     05/29/2015 Adverse Reaction    He developed skin rash. Treatment was placed on hold     09/04/2015 Imaging    US venous Doppler is negative for DVT      INTERVAL HISTORY: Please see below for problem oriented charting. He is seen urgently today as an add-on because of feeling unwell. He returns from [place] after 6 weeks trip. He had intermittent coughing and diarrhea while in [place]. The cough is nonproductive. He denies postnasal drip or drainage. There were no associated fever. He denies chest pain or shortness of breath. He  denies recent dental issues. He is compliant taking all his medications as prescribed  REVIEW OF SYSTEMS:   Constitutional: Denies fevers, chills or abnormal weight loss Eyes: Denies blurriness of vision Ears, nose, mouth, throat, and face: Denies mucositis or sore throat Cardiovascular: Denies palpitation, chest discomfort or lower extremity swelling Skin: Denies abnormal skin rashes Lymphatics: Denies new lymphadenopathy or easy bruising Neurological:Denies numbness, tingling or new weaknesses Behavioral/Psych: Mood is stable, no new changes  All other systems were reviewed with the patient and are negative.  I have reviewed the past medical history, past surgical history, social history and family history with the patient and they are unchanged from previous note.  ALLERGIES:  is allergic to velcade [bortezomib] and zometa [zoledronic acid].  MEDICATIONS:  Current Outpatient Prescriptions  Medication Sig Dispense Refill  . acyclovir (ZOVIRAX) 800 MG tablet TK 1 T PO BID  2  . CALCIUM PO Take 1,000 mg by mouth daily.    . Cholecalciferol (VITAMIN D3) 2000 UNITS TABS Take 1 tablet by mouth daily.     . folic acid (FOLVITE) 1 MG tablet TAKE 1 TABLET BY MOUTH EVERY DAY 30 tablet 0  . insulin glargine (LANTUS) 100 UNIT/ML injection Inject 5-6 Units into the skin at bedtime. Reported on 07/04/2015    . lenalidomide (REVLIMID) 5 MG capsule Take 1 capsule daily for 21 days, off 7 days 21 capsule 0  . metFORMIN (GLUCOPHAGE) 500 MG tablet Take 500 mg by mouth 2 (two) times daily with a meal. Reported on 07/04/2015    . Multiple Vitamin (MULTIVITAMIN) tablet Take 1 tablet by mouth daily.    . pantoprazole (PROTONIX) 40 MG tablet Take 40 mg by mouth.    . potassium chloride (MICRO-K) 10 MEQ CR capsule Take 10 mEq by mouth 2 (two) times daily.     . pravastatin (PRAVACHOL) 40 MG tablet Reported on 07/04/2015  0  . prochlorperazine (COMPAZINE) 10 MG tablet Take 10 mg by mouth. Reported on 07/04/2015      No current facility-administered medications for this visit.     PHYSICAL EXAMINATION: ECOG PERFORMANCE STATUS: 1 - Symptomatic but completely ambulatory  Vitals:   11/04/15 1316  BP: 90/74  Pulse: 86  Resp: 20  Temp: 98.3 F (36.8 C)   Filed Weights   11/04/15 1316  Weight: 197 lb 6.4 oz (89.5 kg)    GENERAL:alert, no distress and comfortable SKIN: skin color, texture, turgor are normal, no rashes or significant lesions EYES: normal, Conjunctiva are pink and non-injected, sclera clear OROPHARYNX:no exudate, no erythema and lips, buccal mucosa, and tongue normal  NECK: supple, thyroid normal size, non-tender, without nodularity LYMPH:  no palpable lymphadenopathy in the cervical, axillary or inguinal LUNGS: clear to auscultation and percussion with normal breathing effort HEART: regular rate & rhythm and no murmurs and no lower extremity edema ABDOMEN:abdomen soft, non-tender and normal bowel sounds  Musculoskeletal:no cyanosis of digits and no clubbing  NEURO: alert & oriented x 3 with fluent speech, no focal motor/sensory deficits  LABORATORY DATA:  I have reviewed the data as listed    Component Value Date/Time   NA 134 (L) 11/04/2015 1259   K 3.9 11/04/2015 1259   CL 107 05/27/2014 1000   CO2 24 11/04/2015 1259   GLUCOSE 143 (H) 11/04/2015 1259   BUN 13.1 11/04/2015 1259   CREATININE 1.5 (H) 11/04/2015 1259   CALCIUM 9.7 11/04/2015 1259   PROT 7.1 11/04/2015 1259   ALBUMIN 3.4 (L) 11/04/2015 1259   AST 17 11/04/2015 1259   ALT 29 11/04/2015 1259   ALKPHOS 54 11/04/2015 1259   BILITOT 0.96 11/04/2015 1259   GFRNONAA 70 (L) 05/27/2014 1000   GFRAA 81 (L) 05/27/2014 1000    No results found for: SPEP, UPEP  Lab Results  Component Value Date   WBC 5.5 11/04/2015   NEUTROABS 2.4 11/04/2015   HGB 13.5 11/04/2015   HCT 40.9 11/04/2015   MCV 93.1 11/04/2015   PLT 280 11/04/2015      Chemistry      Component Value Date/Time   NA 134 (L) 11/04/2015  1259   K 3.9 11/04/2015 1259   CL 107 05/27/2014 1000   CO2 24 11/04/2015 1259   BUN 13.1 11/04/2015 1259   CREATININE 1.5 (H) 11/04/2015 1259      Component Value Date/Time   CALCIUM 9.7 11/04/2015 1259   ALKPHOS 54 11/04/2015 1259   AST 17 11/04/2015 1259   ALT 29 11/04/2015 1259   BILITOT 0.96 11/04/2015 1259       RADIOGRAPHIC STUDIES: I have personally reviewed the radiological images as listed and agreed with the findings in the report. Dg Chest 2 View  Result Date: 11/04/2015 CLINICAL DATA:  Cough for 2 weeks.  Multiple myeloma in remission. EXAM: CHEST  2 VIEW COMPARISON:  Radiographs 06/27/2014.  CT 07/06/2014 FINDINGS: The heart size and mediastinal contours are stable. There is stable chronic bibasilar scarring or atelectasis. The lungs are otherwise clear. There is no pleural effusion or pneumothorax. Peripherally calcified right thyroid lesion appears unchanged. There are scattered lytic lesions throughout the ribs without evidence of acute fracture. IMPRESSION: Stable chest.  No acute cardiopulmonary process. Electronically Signed   By: Richardean Sale M.D.   On: 11/04/2015 15:42     ASSESSMENT & PLAN:  Multiple myeloma in remission ([place]) He tolerated lower dose of Revlimid well. Recent blood work showed that he remained in remission He will continue antimicrobial prophylaxis as [facility]. He will continue on vitamin D and calcium supplement. He had no recent dental issues. He will continue Zometa today   S/P bone marrow transplant [facility]) The patient is returning to [facility] continue close monitoring here for posttransplant related complications He will continue antimicrobial prophylaxis as directed. Of note, the patient is not taking Bactrim. He will review the medications with his physician at [facility] tomorrow  Cough He had recent cough. The patient is not neutropenic. Chest x-ray is clear. I recommend conservative  management with over-the-counter cough syrup for now. His examination is not consistent with acute bacterial infection requiring antibiotic therapy.  Diarrhea He had recent diarrhea while he is [place]. The diarrhea had resolved. I suspect the diarrhea is the cause of his hyponatremia and hypotension. I recommend increase oral fluid intake   Orders Placed This Encounter  Procedures  . DG Chest  2 View    Standing Status:   Future    Number of Occurrences:   1    Standing Expiration Date:   12/08/2016    Order Specific Question:   Reason for exam:    Answer:   cough    Order Specific Question:   Preferred imaging location?    Answer:   [facility]   All questions were answered. The patient knows to call the clinic with any problems, questions or concerns. No barriers to learning was detected. I spent 25 minutes counseling the patient face to face. The total time spent in the appointment was 30 minutes and more than 50% was on counseling and review of test results     [facility], [place], MD 11/05/2015 3:21 PM

## 2015-11-05 NOTE — Assessment & Plan Note (Signed)
He had recent diarrhea while [place] is [place]. The diarrhea had resolved. I suspect the diarrhea is the cause of his hyponatremia and hypotension. I recommend increase oral fluid intake

## 2015-11-05 NOTE — Assessment & Plan Note (Signed)
The patient is returning to [facility] continue close monitoring here for posttransplant related complications He will continue antimicrobial prophylaxis as directed. Of note, the patient is not taking Bactrim. He will review the medications with his physician at [facility] tomorrow

## 2015-11-05 NOTE — Assessment & Plan Note (Signed)
He had recent cough. The patient is not neutropenic. Chest x-ray is clear. I recommend conservative management with over-the-counter cough syrup for now. His examination is not consistent with acute bacterial infection requiring antibiotic therapy.

## 2015-11-05 NOTE — Assessment & Plan Note (Signed)
He tolerated lower dose of Revlimid well. Recent blood work showed that he remained in remission He will continue antimicrobial prophylaxis as directed from [facility]. He will continue on vitamin D and calcium supplement. He had no recent dental issues. He will continue Zometa today

## 2015-11-06 LAB — MULTIPLE MYELOMA PANEL, SERUM
Albumin SerPl Elph-Mcnc: 3.3 g/dL (ref 2.9–4.4)
Albumin/Glob SerPl: 1.2 (ref 0.7–1.7)
Alpha 1: 0.2 g/dL (ref 0.0–0.4)
Alpha2 Glob SerPl Elph-Mcnc: 1.1 g/dL — ABNORMAL HIGH (ref 0.4–1.0)
B-GLOBULIN SERPL ELPH-MCNC: 1.1 g/dL (ref 0.7–1.3)
GAMMA GLOB SERPL ELPH-MCNC: 0.6 g/dL (ref 0.4–1.8)
GLOBULIN, TOTAL: 2.9 g/dL (ref 2.2–3.9)
IgA, Qn, Serum: 109 mg/dL (ref 61–437)
IgG, Qn, Serum: 717 mg/dL (ref 700–1600)
IgM, Qn, Serum: 36 mg/dL (ref 20–172)
Total Protein: 6.2 g/dL (ref 6.0–8.5)

## 2015-11-08 ENCOUNTER — Other Ambulatory Visit: Payer: Self-pay | Admitting: Hematology and Oncology

## 2015-11-11 ENCOUNTER — Ambulatory Visit: Payer: BC Managed Care – PPO

## 2015-11-11 ENCOUNTER — Ambulatory Visit: Payer: BC Managed Care – PPO | Admitting: Hematology and Oncology

## 2015-11-21 ENCOUNTER — Telehealth: Payer: Self-pay | Admitting: *Deleted

## 2015-11-21 MED ORDER — HYDROCODONE-HOMATROPINE 5-1.5 MG/5ML PO SYRP: 5.0000 mL | ORAL_SOLUTION | Freq: Four times a day (QID) | ORAL | 0 refills | Status: DC | PRN

## 2015-11-21 NOTE — Telephone Encounter (Signed)
Pt says OTC cough medication is not helping his cough.  He asks if Dr. Alvy Bimler can prescribe something else for his cough?  He also asks for Handicap parking placard.  Form placed on Dr. Calton Dach desk for signature.

## 2015-11-21 NOTE — Telephone Encounter (Signed)
Pt came by office and picked up Rx and Handicap parking form.

## 2015-12-05 ENCOUNTER — Telehealth: Payer: Self-pay | Admitting: *Deleted

## 2015-12-05 NOTE — Telephone Encounter (Signed)
Received copy of Pt Instructions from Dr. Burnett Sheng office at [facility].  Called [facility] for more information and to ask if Dr. Harvel Ricks wants Dr. Alvy Bimler to do anything or was this FYI only?  Pt does not have any appts scheduled with [place] at this time.  [facility] faxed over the office note and asked one of their clinic nurses to call us back.  Sidney Ace, RN called back and confirmed that the faxed information was FYI only.  Pt is being followed in their clinic for now and does not need any appts w/ Dr. Lurlean Leyden at this time.  Gave office note to Dr. Alvy Bimler and notified her of above.

## 2015-12-31 ENCOUNTER — Telehealth: Payer: Self-pay | Admitting: *Deleted

## 2015-12-31 NOTE — Telephone Encounter (Signed)
Pt states Dr. Harvel Ricks recommends Zometa once a month at our clinic.  Pt recently had Bone Density scan which he says shows Osteoporosis.  He is currently taking Ninlaro 4 mg and Dexamethasone 40 mg weekly.  He saw Dr. Harvel Ricks today and again in 3 months.

## 2016-01-01 ENCOUNTER — Other Ambulatory Visit: Payer: Self-pay | Admitting: Hematology and Oncology

## 2016-01-01 NOTE — Telephone Encounter (Signed)
I have placed scheduling order to see him next week

## 2016-01-06 ENCOUNTER — Other Ambulatory Visit: Payer: Self-pay | Admitting: Hematology and Oncology

## 2016-01-06 DIAGNOSIS — C9001 Multiple myeloma in remission: Secondary | ICD-10-CM

## 2016-01-07 ENCOUNTER — Other Ambulatory Visit: Payer: Self-pay | Admitting: Hematology and Oncology

## 2016-01-07 ENCOUNTER — Encounter: Payer: Self-pay | Admitting: Hematology and Oncology

## 2016-01-07 ENCOUNTER — Other Ambulatory Visit (HOSPITAL_BASED_OUTPATIENT_CLINIC_OR_DEPARTMENT_OTHER): Payer: BC Managed Care – PPO

## 2016-01-07 ENCOUNTER — Telehealth: Payer: Self-pay | Admitting: Hematology and Oncology

## 2016-01-07 ENCOUNTER — Ambulatory Visit (HOSPITAL_BASED_OUTPATIENT_CLINIC_OR_DEPARTMENT_OTHER): Payer: BC Managed Care – PPO | Admitting: Hematology and Oncology

## 2016-01-07 ENCOUNTER — Ambulatory Visit (HOSPITAL_BASED_OUTPATIENT_CLINIC_OR_DEPARTMENT_OTHER): Payer: BC Managed Care – PPO

## 2016-01-07 VITALS — BP 118/73 | HR 70 | Temp 97.4°F | Resp 17 | Ht 72.0 in | Wt 202.7 lb

## 2016-01-07 DIAGNOSIS — C9001 Multiple myeloma in remission: Secondary | ICD-10-CM

## 2016-01-07 DIAGNOSIS — D72829 Elevated white blood cell count, unspecified: Secondary | ICD-10-CM | POA: Diagnosis not present

## 2016-01-07 DIAGNOSIS — E099 Drug or chemical induced diabetes mellitus without complications: Secondary | ICD-10-CM | POA: Diagnosis not present

## 2016-01-07 DIAGNOSIS — R5381 Other malaise: Secondary | ICD-10-CM

## 2016-01-07 DIAGNOSIS — Z8639 Personal history of other endocrine, nutritional and metabolic disease: Secondary | ICD-10-CM

## 2016-01-07 DIAGNOSIS — T380X5A Adverse effect of glucocorticoids and synthetic analogues, initial encounter: Secondary | ICD-10-CM

## 2016-01-07 DIAGNOSIS — Z9481 Bone marrow transplant status: Secondary | ICD-10-CM

## 2016-01-07 LAB — CBC WITH DIFFERENTIAL/PLATELET
BASO%: 0.1 % (ref 0.0–2.0)
Basophils Absolute: 0 10*3/uL (ref 0.0–0.1)
EOS ABS: 0 10*3/uL (ref 0.0–0.5)
EOS%: 0.1 % (ref 0.0–7.0)
HEMATOCRIT: 39.2 % (ref 38.4–49.9)
HGB: 12.6 g/dL — ABNORMAL LOW (ref 13.0–17.1)
LYMPH#: 2.3 10*3/uL (ref 0.9–3.3)
LYMPH%: 10.8 % — AB (ref 14.0–49.0)
MCH: 30.9 pg (ref 27.2–33.4)
MCHC: 32.2 g/dL (ref 32.0–36.0)
MCV: 95.9 fL (ref 79.3–98.0)
MONO#: 1.9 10*3/uL — AB (ref 0.1–0.9)
MONO%: 8.9 % (ref 0.0–14.0)
NEUT%: 80.1 % — AB (ref 39.0–75.0)
NEUTROS ABS: 17 10*3/uL — AB (ref 1.5–6.5)
PLATELETS: 245 10*3/uL (ref 140–400)
RBC: 4.09 10*6/uL — ABNORMAL LOW (ref 4.20–5.82)
RDW: 16.2 % — ABNORMAL HIGH (ref 11.0–14.6)
WBC: 21.2 10*3/uL — AB (ref 4.0–10.3)

## 2016-01-07 LAB — COMPREHENSIVE METABOLIC PANEL
ALK PHOS: 46 U/L (ref 40–150)
ALT: 35 U/L (ref 0–55)
ANION GAP: 12 meq/L — AB (ref 3–11)
AST: 22 U/L (ref 5–34)
Albumin: 4.1 g/dL (ref 3.5–5.0)
BILIRUBIN TOTAL: 1.73 mg/dL — AB (ref 0.20–1.20)
BUN: 11.7 mg/dL (ref 7.0–26.0)
CALCIUM: 10.7 mg/dL — AB (ref 8.4–10.4)
CO2: 23 mEq/L (ref 22–29)
CREATININE: 1.2 mg/dL (ref 0.7–1.3)
Chloride: 105 mEq/L (ref 98–109)
EGFR: 65 mL/min/{1.73_m2} — AB (ref >90)
Glucose: 185 mg/dl — ABNORMAL HIGH (ref 70–140)
Potassium: 3.7 mEq/L (ref 3.5–5.1)
Sodium: 141 mEq/L (ref 136–145)
TOTAL PROTEIN: 6.9 g/dL (ref 6.4–8.3)

## 2016-01-07 MED ORDER — ZOLEDRONIC ACID 4 MG/5ML IV CONC
3.5000 mg | Freq: Once | INTRAVENOUS | Status: AC
  Administered 2016-01-07: 3.5 mg via INTRAVENOUS
  Filled 2016-01-07: qty 4.38

## 2016-01-07 NOTE — Patient Instructions (Signed)
Zoledronic Acid injection (Osteoporosis) What is this medicine? ZOLEDRONIC ACID (ZOE le dron ik AS id) lowers the amount of calcium loss from bone. It is used to treat Paget's disease and osteoporosis in women. This medicine may be used for other purposes; ask your health care provider or pharmacist if you have questions. What should I tell my health care provider before I take this medicine? They need to know if you have any of these conditions: -aspirin-sensitive asthma -cancer, especially if you are receiving medicines used to treat cancer -dental disease or wear dentures -infection -kidney disease -low levels of calcium in the blood -past surgery on the parathyroid gland or intestines -receiving corticosteroids like dexamethasone or prednisone -an unusual or allergic reaction to zoledronic acid, other medicines, foods, dyes, or preservatives -pregnant or trying to get pregnant -breast-feeding How should I use this medicine? This medicine is for infusion into a vein. It is given by a health care professional in a hospital or clinic setting. Talk to your pediatrician regarding the use of this medicine in children. This medicine is not approved for use in children. Overdosage: If you think you have taken too much of this medicine contact a poison control center or emergency room at once. NOTE: This medicine is only for you. Do not share this medicine with others. What if I miss a dose? It is important not to miss your dose. Call your doctor or health care professional if you are unable to keep an appointment. What may interact with this medicine? -certain antibiotics given by injection -NSAIDs, medicines for pain and inflammation, like ibuprofen or naproxen -some diuretics like bumetanide, furosemide -teriparatide This list may not describe all possible interactions. Give your health care provider a list of all the medicines, herbs, non-prescription drugs, or dietary supplements you  use. Also tell them if you smoke, drink alcohol, or use illegal drugs. Some items may interact with your medicine. What should I watch for while using this medicine? Visit your doctor or health care professional for regular checkups. It may be some time before you see the benefit from this medicine. Do not stop taking your medicine unless your doctor tells you to. Your doctor may order blood tests or other tests to see how you are doing. Women should inform their doctor if they wish to become pregnant or think they might be pregnant. There is a potential for serious side effects to an unborn child. Talk to your health care professional or pharmacist for more information. You should make sure that you get enough calcium and vitamin D while you are taking this medicine. Discuss the foods you eat and the vitamins you take with your health care professional. Some people who take this medicine have severe bone, joint, and/or muscle pain. This medicine may also increase your risk for jaw problems or a broken thigh bone. Tell your doctor right away if you have severe pain in your jaw, bones, joints, or muscles. Tell your doctor if you have any pain that does not go away or that gets worse. Tell your dentist and dental surgeon that you are taking this medicine. You should not have major dental surgery while on this medicine. See your dentist to have a dental exam and fix any dental problems before starting this medicine. Take good care of your teeth while on this medicine. Make sure you see your dentist for regular follow-up appointments. What side effects may I notice from receiving this medicine? Side effects that you should report to   your doctor or health care professional as soon as possible: -allergic reactions like skin rash, itching or hives, swelling of the face, lips, or tongue -anxiety, confusion, or depression -breathing problems -changes in vision -eye pain -feeling faint or lightheaded,  falls -jaw pain, especially after dental work -mouth sores -muscle cramps, stiffness, or weakness -redness, blistering, peeling or loosening of the skin, including inside the mouth -trouble passing urine or change in the amount of urine Side effects that usually do not require medical attention (report to your doctor or health care professional if they continue or are bothersome): -bone, joint, or muscle pain -constipation -diarrhea -fever -hair loss -irritation at site where injected -loss of appetite -nausea, vomiting -stomach upset -trouble sleeping -trouble swallowing -weak or tired This list may not describe all possible side effects. Call your doctor for medical advice about side effects. You may report side effects to FDA at 1-800-FDA-1088. Where should I keep my medicine? This drug is given in a hospital or clinic and will not be stored at home. NOTE: This sheet is a summary. It may not cover all possible information. If you have questions about this medicine, talk to your doctor, pharmacist, or health care provider.    2016, [employer] Standard. (2013-08-05 14:19:57)  

## 2016-01-07 NOTE — Progress Notes (Signed)
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 congo red staing reveals amyloid deposition within some of the amorphous material and within blood vessels. Normal cytogenetics.      02/14/2014 Imaging    PET CT scan showed diffuse skeletal involvement      02/26/2014 - 04/25/2014 Chemotherapy    He is started on chemotherapy with Velcade, Cytoxan and dexamethasone. Cytoxan was discontinued due to recurrent admission and recent clostridium Diff infection.  Velcade was suspected to be a cause of fever that was discontinued.      03/09/2014 - 03/11/2014 Hospital Admission    He was admitted to the hospital with fevers and chills and was diagnosed with pneumonia      03/22/2014 - 03/26/2014 Hospital Admission    He is admitted to the hospital again for  fever, chills and possible pneumonia      04/19/2014 - 04/22/2014 Hospital Admission    He is admitted to the hospital for fever, chills and diarrhea and was subsequently found to have C. difficile infection      05/04/2014 - 05/05/2014 Hospital Admission    The patient was admitted to the hospital again with fever and chills. No source of infection was found and he was not given antibiotics.      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      06/04/2014 - 06/14/2014 Chemotherapy    He was restarted back on Revlimid with dexamethasone only. Treatment was put on hold due to recurrent fever of unknown origin      01/14/2015 - 01/14/2015 Chemotherapy    He received high dose melphalan      01/15/2015 Bone Marrow Transplant    He received autologous BMT      01/22/2015 - 01/27/2015 Hospital Admission    [facility] was found to have E Coli bacteremia      02/28/2015 - 11/04/2015 Chemotherapy    He began maintenance Zometa only. Revlimid is discontinued due to skin rash and intolerance      04/17/2015 Bone Marrow Biopsy    [facility]  05/21/2015 Bone Marrow Biopsy    Re[facility] showed no residual plasma cells or amyloidosis      05/29/2015 Adverse Reaction    He developed skin rash. Treatment was placed on hold      09/04/2015 Imaging    US venous Doppler is negative for DVT      11/05/2015 -  Chemotherapy    He received Ninlaro along with dexamethasone       INTERVAL HISTORY: Please see below for problem oriented charting. He returns with his wife to continue on monthly Zometa. His recent bone density scan shows severe osteoporosis. The patient is noted to have excessive weight gain and generalized  deconditioning since the last time I saw him. His post-transplant treatment with maintenance Revlimid was discontinued due to skin rash and he was switched to [facility] He denies recent fever or chills with Ninlaro He has to take insulin for several days after his weekly dexamethasone. He wakes up at night many times due to nocturia with poor sleep and then he would sleep during daytime. He is unable to work due to lack of concentration and generalized weakness.  REVIEW OF SYSTEMS:   Constitutional: Denies fevers, chills or abnormal weight loss Eyes: Denies blurriness of vision Ears, nose, mouth, throat, and face: Denies mucositis or sore throat Respiratory: Denies cough, dyspnea or wheezes Cardiovascular: Denies palpitation, chest discomfort or lower extremity swelling Gastrointestinal:  Denies nausea, heartburn or change in bowel habits Skin: Denies abnormal skin rashes Lymphatics: Denies new lymphadenopathy or easy bruising Behavioral/Psych: Mood is stable, no new changes  All other systems were reviewed with the patient and are negative.  I have reviewed the past medical history, past surgical history, social history and family history with the patient and they are unchanged from previous note.  ALLERGIES:  is allergic to velcade [bortezomib].  MEDICATIONS:  Current Outpatient Prescriptions  Medication Sig Dispense Refill  . acyclovir (ZOVIRAX) 800 MG tablet TK 1 T PO BID  2  . CALCIUM PO Take 1,000 mg by mouth daily.    . Cholecalciferol (VITAMIN D3) 2000 UNITS TABS Take 1 tablet by mouth daily.     Marland Kitchen dexamethasone (DECADRON) 4 MG tablet Take 40 mg by mouth once a week. Mondays    . folic acid (FOLVITE) 1 MG tablet TAKE 1 TABLET BY MOUTH EVERY DAY 30 tablet 0  . HYDROcodone-homatropine (HYCODAN) 5-1.5 MG/5ML syrup Take 5 mLs by mouth every 6 (six) hours as needed for cough. 473 mL 0  . insulin glargine (LANTUS) 100 UNIT/ML injection Inject 5-6 Units into the skin at bedtime.  Reported on 07/04/2015    . ixazomib citrate (NINLARO) 4 MG capsule Take 4 mg by mouth once a week. Take on an empty stomach 1hr before or 2hrs after food. Do not crush, chew or open.  Once a week on Mondays x 3 weeks on and 1 week off    . lenalidomide (REVLIMID) 5 MG capsule Take 1 capsule daily for 21 days, off 7 days 21 capsule 0  . metFORMIN (GLUCOPHAGE) 500 MG tablet Take 500 mg by mouth 2 (two) times daily with a meal. Reported on 07/04/2015    . Multiple Vitamin (MULTIVITAMIN) tablet Take 1 tablet by mouth daily.    . pantoprazole (PROTONIX) 40 MG tablet Take 40 mg by mouth.    . potassium chloride (MICRO-K) 10 MEQ CR capsule Take 10 mEq by mouth 2 (two) times daily.     . pravastatin (  PRAVACHOL) 40 MG tablet Reported on 07/04/2015  0  . prochlorperazine (COMPAZINE) 10 MG tablet Take 10 mg by mouth. Reported on 07/04/2015     No current facility-administered medications for this visit.     PHYSICAL EXAMINATION: ECOG PERFORMANCE STATUS: 2 - Symptomatic, <50% confined to bed  Vitals:   01/07/16 1459  BP: 118/73  Pulse: 70  Resp: 17  Temp: 97.4 F (36.3 C)   Filed Weights   01/07/16 1459  Weight: 202 lb 11.2 oz (91.9 kg)    GENERAL:alert, no distress and comfortable. He is moderately obese SKIN: skin color, texture, turgor are normal, no rashes or significant lesions EYES: normal, Conjunctiva are pink and non-injected, sclera clear OROPHARYNX:no exudate, no erythema and lips, buccal mucosa, and tongue normal  NECK: supple, thyroid normal size, non-tender, without nodularity LYMPH:  no palpable lymphadenopathy in the cervical, axillary or inguinal LUNGS: clear to auscultation and percussion with normal breathing effort HEART: regular rate & rhythm and no murmurs and no lower extremity edema ABDOMEN:abdomen soft, non-tender and normal bowel sounds Musculoskeletal:no cyanosis of digits and no clubbing . 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 NEURO: alert & oriented  x 3 with fluent speech, no focal motor/sensory deficits  LABORATORY DATA:  I have reviewed the data as listed    Component Value Date/Time   NA 141 01/07/2016 1448   K 3.7 01/07/2016 1448   CL 107 05/27/2014 1000   CO2 23 01/07/2016 1448   GLUCOSE 185 (H) 01/07/2016 1448   BUN 11.7 01/07/2016 1448   CREATININE 1.2 01/07/2016 1448   CALCIUM 10.7 (H) 01/07/2016 1448   PROT 6.9 01/07/2016 1448   ALBUMIN 4.1 01/07/2016 1448   AST 22 01/07/2016 1448   ALT 35 01/07/2016 1448   ALKPHOS 46 01/07/2016 1448   BILITOT 1.73 (H) 01/07/2016 1448   GFRNONAA 70 (L) 05/27/2014 1000   GFRAA 81 (L) 05/27/2014 1000    No results found for: SPEP, UPEP  Lab Results  Component Value Date   WBC 21.2 (H) 01/07/2016   NEUTROABS 17.0 (H) 01/07/2016   HGB 12.6 (L) 01/07/2016   HCT 39.2 01/07/2016   MCV 95.9 01/07/2016   PLT 245 01/07/2016      Chemistry      Component Value Date/Time   NA 141 01/07/2016 1448   K 3.7 01/07/2016 1448   CL 107 05/27/2014 1000   CO2 23 01/07/2016 1448   BUN 11.7 01/07/2016 1448   CREATININE 1.2 01/07/2016 1448      Component Value Date/Time   CALCIUM 10.7 (H) 01/07/2016 1448   ALKPHOS 46 01/07/2016 1448   AST 22 01/07/2016 1448   ALT 35 01/07/2016 1448   BILITOT 1.73 (H) 01/07/2016 1448      ASSESSMENT & PLAN:  Multiple myeloma in remission ([place]) His treatment is switched to Ninlaro. He tolerated that very well with no side effects. His last blood work showed that he is still in remission. I am concerned about side effects of high-dose dexamethasone and recommend reducing the weekly dexamethasone to 20 mg. He will continue vitamin D with calcium and monthly Zometa. He will get dental appointment soon but denies signs or symptoms of osteonecrosis of the jaw I will see him back next month for further review  S/P bone marrow transplant [facility]) The patient is returning to [facility] at the end of the year He will continue antimicrobial prophylaxis as  directed.  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 His last blood work showed that he is in remission. I think the risks of taking high-dose dexamethasone outweighs the benefit In addition to steroid-induced diabetes, the patient has evidence of muscle wasting, weight gain, frequent nocturia and mild signs of cushingoid features. We discussed that reducing the dose weekly dexamethasone with eventual plan for taper would not compromise the efficacy of his treatment. Starting next week, I recommend reducing weekly dexamethasone to 20 mg. He will continue to check his blood sugar. If his serum blood sugar is greater than 200, he will continue same dose of insulin therapy at 5 units at night. However, if his serum blood sugar is less than 200, he reduce the insulin dose to 3 units. I will continue to manage his diabetes along with the patient on his monthly visit  Physical deconditioning He has generalized deconditioning and has poor sleep. His sleep during daytime but wakes up several times at nighttime due to nocturia. Hopefully, reducing the dose of dexamethasone will help. I recommend he goes back to the [facility] and increase his activity. The patient appears motivated  Leukocytosis Due to steroids Observe only, he has no signs of infection  Hypercalcemia Likely due to exogenous calcium ingestion with possible mild dehydration Observe only  History of hypokalemia He is taking chronic potassium replacement therapy. His last few potassium levels were within normal limits. I recommend the patient to stop potassium replacement therapy and we will see how he does without neck months. We discussed potassium rich diet   Orders Placed This Encounter  Procedures  . CBC with Differential/Platelet    Standing Status:   Future    Standing Expiration Date:   02/10/2017  . Comprehensive metabolic  panel    Standing Status:   Future    Standing Expiration Date:   02/10/2017  . Kappa/lambda light chains    Standing Status:   Future    Standing Expiration Date:   02/10/2017  . Multiple Myeloma Panel (SPEP&IFE w/QIG)    Standing Status:   Future    Standing Expiration Date:   02/10/2017   All questions were answered. The patient knows to call the clinic with any problems, questions or concerns. No barriers to learning was detected. I spent 25 minutes counseling the patient face to face. The total time spent in the appointment was 40 minutes and more than 50% was on counseling and review of test results     Heath Lark, MD 01/08/2016 8:43 AM

## 2016-01-07 NOTE — Telephone Encounter (Signed)
Gave patient avs report and appointments for November.  °

## 2016-01-07 NOTE — Assessment & Plan Note (Signed)
The patient is returning to [facility] at the end of the year He will continue antimicrobial prophylaxis as directed.

## 2016-01-07 NOTE — Assessment & Plan Note (Signed)
His treatment is switched to Ninlaro. He tolerated that very well with no side effects. His last blood work showed that he is still in remission. I am concerned about side effects of high-dose dexamethasone and recommend reducing the weekly dexamethasone to 20 mg. He will continue vitamin D with calcium and monthly Zometa. He will get dental appointment soon but denies signs or symptoms of osteonecrosis of the jaw I will see him back next month for further review

## 2016-01-08 DIAGNOSIS — R5381 Other malaise: Secondary | ICD-10-CM | POA: Insufficient documentation

## 2016-01-08 DIAGNOSIS — Z8639 Personal history of other endocrine, nutritional and metabolic disease: Secondary | ICD-10-CM | POA: Insufficient documentation

## 2016-01-08 DIAGNOSIS — E099 Drug or chemical induced diabetes mellitus without complications: Secondary | ICD-10-CM | POA: Insufficient documentation

## 2016-01-08 DIAGNOSIS — T380X5A Adverse effect of glucocorticoids and synthetic analogues, initial encounter: Secondary | ICD-10-CM

## 2016-01-08 LAB — KAPPA/LAMBDA LIGHT CHAINS
IG KAPPA FREE LIGHT CHAIN: 6 mg/L (ref 3.3–19.4)
Ig Lambda Free Light Chain: 6.9 mg/L (ref 5.7–26.3)
KAPPA/LAMBDA FLC RATIO: 0.87 (ref 0.26–1.65)

## 2016-01-08 NOTE — Assessment & Plan Note (Signed)
Likely due to exogenous calcium ingestion with possible mild dehydration Observe only

## 2016-01-08 NOTE — Assessment & Plan Note (Signed)
He is taking chronic potassium replacement therapy. His last few potassium levels were within normal limits. I recommend the patient to stop potassium replacement therapy and we will see how he does without neck months. We discussed potassium rich diet

## 2016-01-08 NOTE — Assessment & Plan Note (Signed)
He has generalized deconditioning and has poor sleep. His sleep during daytime but wakes up several times at nighttime due to nocturia. Hopefully, reducing the dose of dexamethasone will help. I recommend he goes back to the [facility] and increase his activity. The patient appears motivated

## 2016-01-08 NOTE — Assessment & Plan Note (Addendum)
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 His last blood work showed that he is in remission. I think the risks of taking high-dose dexamethasone outweighs the benefit In addition to steroid-induced diabetes, the patient has evidence of muscle wasting, weight gain, frequent nocturia and mild signs of cushingoid features. We discussed that reducing the dose weekly dexamethasone with eventual plan for taper would not compromise the efficacy of his treatment. Starting next week, I recommend reducing weekly dexamethasone to 20 mg. He will continue to check his blood sugar. If his serum blood sugar is greater than 200, he will continue same dose of insulin therapy at 5 units at night. However, if his serum blood sugar is less than 200, he reduce the insulin dose to 3 units. I will continue to manage his diabetes along with the patient on his monthly visit

## 2016-01-08 NOTE — Assessment & Plan Note (Signed)
Due to steroids Observe only, he has no signs of infection

## 2016-01-08 NOTE — Addendum Note (Signed)
Addended by: Sharlynn Oliphant A on: 01/08/2016 01:56 PM   Modules accepted: Orders

## 2016-01-09 LAB — MULTIPLE MYELOMA PANEL, SERUM
ALBUMIN SERPL ELPH-MCNC: 4 g/dL (ref 2.9–4.4)
ALPHA 1: 0.2 g/dL (ref 0.0–0.4)
ALPHA2 GLOB SERPL ELPH-MCNC: 0.9 g/dL (ref 0.4–1.0)
Albumin/Glob SerPl: 1.6 (ref 0.7–1.7)
B-GLOBULIN SERPL ELPH-MCNC: 1 g/dL (ref 0.7–1.3)
Gamma Glob SerPl Elph-Mcnc: 0.6 g/dL (ref 0.4–1.8)
Globulin, Total: 2.6 g/dL (ref 2.2–3.9)
IGG (IMMUNOGLOBIN G), SERUM: 597 mg/dL — AB (ref 700–1600)
IGM (IMMUNOGLOBIN M), SRM: 17 mg/dL — AB (ref 20–172)
IgA, Qn, Serum: 61 mg/dL (ref 61–437)
TOTAL PROTEIN: 6.6 g/dL (ref 6.0–8.5)

## 2016-01-10 ENCOUNTER — Telehealth: Payer: Self-pay | Admitting: *Deleted

## 2016-01-10 NOTE — Telephone Encounter (Signed)
Pt left VM asking if ok for him to schedule a Colonoscopy?

## 2016-01-13 NOTE — Telephone Encounter (Signed)
OK to proceed

## 2016-01-14 ENCOUNTER — Telehealth: Payer: Self-pay | Admitting: *Deleted

## 2016-01-14 NOTE — Telephone Encounter (Signed)
LVM for pt informing ok for him to schedule Colonoscopy per Dr. Alvy Bimler.   Please call nurse back if any other questions or concerns.

## 2016-02-06 ENCOUNTER — Other Ambulatory Visit (HOSPITAL_BASED_OUTPATIENT_CLINIC_OR_DEPARTMENT_OTHER): Payer: BC Managed Care – PPO

## 2016-02-06 ENCOUNTER — Ambulatory Visit (HOSPITAL_BASED_OUTPATIENT_CLINIC_OR_DEPARTMENT_OTHER): Payer: BC Managed Care – PPO | Admitting: Hematology and Oncology

## 2016-02-06 ENCOUNTER — Ambulatory Visit (HOSPITAL_BASED_OUTPATIENT_CLINIC_OR_DEPARTMENT_OTHER): Payer: BC Managed Care – PPO

## 2016-02-06 ENCOUNTER — Telehealth: Payer: Self-pay | Admitting: Hematology and Oncology

## 2016-02-06 VITALS — BP 124/75 | HR 77 | Temp 98.2°F | Resp 16 | Ht 72.0 in | Wt 209.8 lb

## 2016-02-06 DIAGNOSIS — C9001 Multiple myeloma in remission: Secondary | ICD-10-CM

## 2016-02-06 DIAGNOSIS — E119 Type 2 diabetes mellitus without complications: Secondary | ICD-10-CM

## 2016-02-06 DIAGNOSIS — R5381 Other malaise: Secondary | ICD-10-CM | POA: Diagnosis not present

## 2016-02-06 DIAGNOSIS — D638 Anemia in other chronic diseases classified elsewhere: Secondary | ICD-10-CM

## 2016-02-06 LAB — CBC WITH DIFFERENTIAL/PLATELET
BASO%: 0.3 % (ref 0.0–2.0)
BASOS ABS: 0 10*3/uL (ref 0.0–0.1)
EOS%: 3.3 % (ref 0.0–7.0)
Eosinophils Absolute: 0.2 10*3/uL (ref 0.0–0.5)
HCT: 35 % — ABNORMAL LOW (ref 38.4–49.9)
HGB: 11.8 g/dL — ABNORMAL LOW (ref 13.0–17.1)
LYMPH%: 24.8 % (ref 14.0–49.0)
MCH: 32.2 pg (ref 27.2–33.4)
MCHC: 33.7 g/dL (ref 32.0–36.0)
MCV: 95.6 fL (ref 79.3–98.0)
MONO#: 0.9 10*3/uL (ref 0.1–0.9)
MONO%: 14.6 % — ABNORMAL HIGH (ref 0.0–14.0)
NEUT#: 3.7 10*3/uL (ref 1.5–6.5)
NEUT%: 57 % (ref 39.0–75.0)
PLATELETS: 186 10*3/uL (ref 140–400)
RBC: 3.66 10*6/uL — AB (ref 4.20–5.82)
RDW: 14.7 % — ABNORMAL HIGH (ref 11.0–14.6)
WBC: 6.4 10*3/uL (ref 4.0–10.3)
lymph#: 1.6 10*3/uL (ref 0.9–3.3)

## 2016-02-06 LAB — COMPREHENSIVE METABOLIC PANEL
ALT: 60 U/L — ABNORMAL HIGH (ref 0–55)
ANION GAP: 10 meq/L (ref 3–11)
AST: 34 U/L (ref 5–34)
Albumin: 3.7 g/dL (ref 3.5–5.0)
Alkaline Phosphatase: 47 U/L (ref 40–150)
BUN: 12.7 mg/dL (ref 7.0–26.0)
CHLORIDE: 109 meq/L (ref 98–109)
CO2: 22 meq/L (ref 22–29)
CREATININE: 1.2 mg/dL (ref 0.7–1.3)
Calcium: 9.2 mg/dL (ref 8.4–10.4)
EGFR: 63 mL/min/{1.73_m2} — AB (ref >90)
Glucose: 158 mg/dl — ABNORMAL HIGH (ref 70–140)
POTASSIUM: 3.6 meq/L (ref 3.5–5.1)
Sodium: 141 mEq/L (ref 136–145)
Total Bilirubin: 0.83 mg/dL (ref 0.20–1.20)
Total Protein: 6.5 g/dL (ref 6.4–8.3)

## 2016-02-06 MED ORDER — SODIUM CHLORIDE 0.9 % IV SOLN
INTRAVENOUS | Status: DC
  Administered 2016-02-06: 16:00:00 via INTRAVENOUS

## 2016-02-06 MED ORDER — ZOLEDRONIC ACID 4 MG/5ML IV CONC
3.5000 mg | Freq: Once | INTRAVENOUS | Status: AC
  Administered 2016-02-06: 3.5 mg via INTRAVENOUS
  Filled 2016-02-06: qty 4.38

## 2016-02-06 NOTE — Telephone Encounter (Signed)
Gave patient avs report and appointments for December  °

## 2016-02-06 NOTE — Patient Instructions (Signed)

## 2016-02-07 ENCOUNTER — Encounter: Payer: Self-pay | Admitting: Hematology and Oncology

## 2016-02-07 ENCOUNTER — Telehealth: Payer: Self-pay | Admitting: *Deleted

## 2016-02-07 LAB — KAPPA/LAMBDA LIGHT CHAINS
Ig Kappa Free Light Chain: 6.5 mg/L (ref 3.3–19.4)
Ig Lambda Free Light Chain: 3.1 mg/L — ABNORMAL LOW (ref 5.7–26.3)
KAPPA/LAMBDA FLC RATIO: 2.1 — AB (ref 0.26–1.65)

## 2016-02-07 NOTE — Progress Notes (Signed)
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 congo red staing reveals amyloid deposition within some of the amorphous material and within blood vessels. Normal cytogenetics.      02/14/2014 Imaging    PET CT scan showed diffuse skeletal involvement      02/26/2014 - 04/25/2014 Chemotherapy    He is started on chemotherapy with Velcade, Cytoxan and dexamethasone. Cytoxan was discontinued due to recurrent admission and recent clostridium Diff infection.  Velcade was suspected to be a cause of fever that was discontinued.      03/09/2014 - 03/11/2014 Hospital Admission    He was admitted to the hospital with fevers and chills and was diagnosed with pneumonia      03/22/2014 - 03/26/2014 Hospital Admission    He is admitted to the hospital again for  fever, chills and possible pneumonia      04/19/2014 - 04/22/2014 Hospital Admission    He is admitted to the hospital for fever, chills and diarrhea and was subsequently found to have C. difficile infection      05/04/2014 - 05/05/2014 Hospital Admission    The patient was admitted to the hospital again with fever and chills. No source of infection was found and he was not given antibiotics.      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      06/04/2014 - 06/14/2014 Chemotherapy    He was restarted back on Revlimid with dexamethasone only. Treatment was put on hold due to recurrent fever of unknown origin      01/14/2015 - 01/14/2015 Chemotherapy    He received high dose melphalan      01/15/2015 Bone Marrow Transplant    He received autologous BMT      01/22/2015 - 01/27/2015 Hospital Admission    [facility] was found to have E Coli bacteremia      02/28/2015 - 11/04/2015 Chemotherapy    He began maintenance Zometa only. Revlimid is discontinued due to skin rash and intolerance      04/17/2015 Bone Marrow Biopsy    [facility]  05/21/2015 Bone Marrow Biopsy    Repeat [facility] at [facility] showed no residual plasma cells or amyloidosis      05/29/2015 Adverse Reaction    He developed skin rash. Treatment was placed on hold      09/04/2015 Imaging    US venous Doppler is negative for DVT      11/05/2015 -  Chemotherapy    He received Ninlaro along with dexamethasone       INTERVAL HISTORY: Please see below for problem oriented charting. He returns for treatment today. He is doing well. He brought with him information regarding his blood sugar control over the past month. His blood sugar control is mildly improved  since we reduced the dose of dexamethasone. He is still not exercising well and has gained some weight. Denies recent infection. No recent bone pain  REVIEW OF SYSTEMS:   Constitutional: Denies fevers, chills or abnormal weight loss Eyes: Denies blurriness of vision Ears, nose, mouth, throat, and face: Denies mucositis or sore throat Respiratory: Denies cough, dyspnea or wheezes Cardiovascular: Denies palpitation, chest discomfort or lower extremity swelling Gastrointestinal:  Denies nausea, heartburn or change in bowel habits Skin: Denies abnormal skin rashes Lymphatics: Denies new lymphadenopathy or easy bruising Neurological:Denies numbness, tingling or new weaknesses Behavioral/Psych: Mood is stable, no new changes  All other systems were reviewed with the patient and are negative.  I have reviewed the past medical history, past surgical history, social history and family history with the patient and they are unchanged from previous note.  ALLERGIES:  is allergic to velcade [bortezomib].  MEDICATIONS:  Current Outpatient Prescriptions  Medication Sig Dispense Refill  . acyclovir (ZOVIRAX) 800 MG tablet TK 1 T PO BID  2  . aspirin EC 81 MG tablet Take by mouth.    . calcium carbonate (CALCIUM 600) 600 MG TABS tablet Take by mouth.    . Cholecalciferol (VITAMIN D3) 2000 UNITS TABS Take 1 tablet by mouth daily.     Marland Kitchen dexamethasone (DECADRON) 4 MG tablet Take 40 mg by mouth once a week. Mondays    . folic acid (FOLVITE) 1 MG tablet TAKE 1 TABLET BY MOUTH EVERY DAY 30 tablet 0  . HYDROcodone-homatropine (HYCODAN) 5-1.5 MG/5ML syrup Take 5 mLs by mouth every 6 (six) hours as needed for cough. (Patient not taking: Reported on 01/08/2016) 473 mL 0  . insulin glargine (LANTUS) 100 UNIT/ML injection Inject 5-6 Units into the skin at bedtime. Reported on 07/04/2015    . ixazomib citrate (NINLARO) 4 MG capsule Take 4 mg by mouth once a week. Take on an empty stomach 1hr before or 2hrs after  food. Do not crush, chew or open.  Once a week on Mondays x 3 weeks on and 1 week off    . levothyroxine (SYNTHROID, LEVOTHROID) 25 MCG tablet     . metFORMIN (GLUCOPHAGE) 500 MG tablet Take 500 mg by mouth 2 (two) times daily with a meal. Reported on 07/04/2015    . Multiple Vitamin (MULTIVITAMIN) tablet Take 1 tablet by mouth daily.    . pantoprazole (PROTONIX) 40 MG tablet Take 40 mg by mouth.    . potassium chloride (MICRO-K) 10 MEQ CR capsule Take 10 mEq by mouth 2 (two) times daily.     . pravastatin (PRAVACHOL) 40 MG tablet Reported on 07/04/2015  0  . prochlorperazine (COMPAZINE) 10 MG tablet Take 10 mg by mouth. Reported on 07/04/2015    . rosuvastatin (CRESTOR) 5 MG tablet  No current facility-administered medications for this visit.     PHYSICAL EXAMINATION: ECOG PERFORMANCE STATUS: 1 - Symptomatic but completely ambulatory  Vitals:   02/06/16 1511  BP: 124/75  Pulse: 77  Resp: 16  Temp: 98.2 F (36.8 C)   Filed Weights   02/06/16 1511  Weight: 209 lb 12.8 oz (95.2 kg)    GENERAL:alert, no distress and comfortable. He is moderately obese SKIN: skin color, texture, turgor are normal, no rashes or significant lesions EYES: normal, Conjunctiva are pink and non-injected, sclera clear OROPHARYNX:no exudate, no erythema and lips, buccal mucosa, and tongue normal  NECK: supple, thyroid normal size, non-tender, without nodularity LYMPH:  no palpable lymphadenopathy in the cervical, axillary or inguinal LUNGS: clear to auscultation and percussion with normal breathing effort HEART: regular rate & rhythm and no murmurs and no lower extremity edema ABDOMEN:abdomen soft, non-tender and normal bowel sounds Musculoskeletal:no cyanosis of digits and no clubbing  NEURO: alert & oriented x 3 with fluent speech, no focal motor/sensory deficits  LABORATORY DATA:  I have reviewed the data as listed    Component Value Date/Time   NA 141 02/06/2016 1457   K 3.6 02/06/2016 1457    CL 107 05/27/2014 1000   CO2 22 02/06/2016 1457   GLUCOSE 158 (H) 02/06/2016 1457   BUN 12.7 02/06/2016 1457   CREATININE 1.2 02/06/2016 1457   CALCIUM 9.2 02/06/2016 1457   PROT 6.5 02/06/2016 1457   ALBUMIN 3.7 02/06/2016 1457   AST 34 02/06/2016 1457   ALT 60 (H) 02/06/2016 1457   ALKPHOS 47 02/06/2016 1457   BILITOT 0.83 02/06/2016 1457   GFRNONAA 70 (L) 05/27/2014 1000   GFRAA 81 (L) 05/27/2014 1000    No results found for: SPEP, UPEP  Lab Results  Component Value Date   WBC 6.4 02/06/2016   NEUTROABS 3.7 02/06/2016   HGB 11.8 (L) 02/06/2016   HCT 35.0 (L) 02/06/2016   MCV 95.6 02/06/2016   PLT 186 02/06/2016      Chemistry      Component Value Date/Time   NA 141 02/06/2016 1457   K 3.6 02/06/2016 1457   CL 107 05/27/2014 1000   CO2 22 02/06/2016 1457   BUN 12.7 02/06/2016 1457   CREATININE 1.2 02/06/2016 1457      Component Value Date/Time   CALCIUM 9.2 02/06/2016 1457   ALKPHOS 47 02/06/2016 1457   AST 34 02/06/2016 1457   ALT 60 (H) 02/06/2016 1457   BILITOT 0.83 02/06/2016 1457       ASSESSMENT & PLAN:  Multiple myeloma in remission ([place]) His treatment is switched to Ninlaro. He tolerated that very well with no side effects. His last blood work showed that he is still in remission. I am concerned about side effects of high-dose dexamethasone and recommend reducing the weekly dexamethasone to 16 mg. He will continue vitamin D with calcium and monthly Zometa. 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 His last blood work showed that he is in remission. I think the risks of taking high-dose dexamethasone outweighs the benefit In addition to steroid-induced diabetes, the patient has evidence of muscle wasting,  weight gain, frequent nocturia and mild signs of cushingoid features. We discussed that reducing the dose weekly dexamethasone with eventual plan  for taper would not compromise the efficacy of his treatment. Starting next week, I recommend reducing weekly dexamethasone to 16 mg with plans for further taper He will continue to check his blood sugar. If his serum blood sugar is greater than 200, he will continue same dose of insulin therapy at 5 units at night. However, if his serum blood sugar is less than 200, he reduce the insulin dose to 3 units. I will continue to manage his diabetes along with the patient on his monthly visit  Physical deconditioning He has generalized deconditioning  His sleep during daytime but wakes up several times at nighttime due to nocturia. Hopefully, reducing the dose of dexamethasone will help. I recommend he goes back to the [facility] and increase his activity. The patient appears motivated  Anemia in chronic illness This is likely due to recent treatment. The patient denies recent history of bleeding such as epistaxis, hematuria or hematochezia. He is asymptomatic from the anemia. I will observe for now.  He does not require transfusion now. I will continue the chemotherapy at current dose without dosage adjustment.  If the anemia gets progressive worse in the future, I might have to delay his treatment or adjust the chemotherapy dose.    Orders Placed This Encounter  Procedures  . CBC with Differential/Platelet    Standing Status:   Future    Standing Expiration Date:   03/12/2017  . Comprehensive metabolic panel    Standing Status:   Future    Standing Expiration Date:   03/12/2017  . Kappa/lambda light chains    Standing Status:   Future    Standing Expiration Date:   03/12/2017  . Multiple Myeloma Panel (SPEP&IFE w/QIG)    Standing Status:   Future    Standing Expiration Date:   03/12/2017   All questions were answered. The patient knows to call the  clinic with any problems, questions or concerns. No barriers to learning was detected. I spent 15 minutes counseling the patient face to face. The total time spent in the appointment was 20 minutes and more than 50% was on counseling and review of test results     Heath Lark, MD 02/07/2016 11:24 AM

## 2016-02-07 NOTE — Assessment & Plan Note (Signed)
This is likely due to recent treatment. The patient denies recent history of bleeding such as epistaxis, hematuria or hematochezia. He is asymptomatic from the anemia. I will observe for now.  He does not require transfusion now. I will continue the chemotherapy at current dose without dosage adjustment.  If the anemia gets progressive worse in the future, I might have to delay his treatment or adjust the chemotherapy dose.  

## 2016-02-07 NOTE — Assessment & Plan Note (Signed)
His treatment is switched to Ninlaro. He tolerated that very well with no side effects. His last blood work showed that he is still in remission. I am concerned about side effects of high-dose dexamethasone and recommend reducing the weekly dexamethasone to 16 mg. He will continue vitamin D with calcium and monthly Zometa. He will get dental appointment soon but denies signs or symptoms of osteonecrosis of the jaw I will see him back next month for further review

## 2016-02-07 NOTE — Assessment & Plan Note (Signed)
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 His last blood work showed that he is in remission. I think the risks of taking high-dose dexamethasone outweighs the benefit In addition to steroid-induced diabetes, the patient has evidence of muscle wasting, weight gain, frequent nocturia and mild signs of cushingoid features. We discussed that reducing the dose weekly dexamethasone with eventual plan for taper would not compromise the efficacy of his treatment. Starting next week, I recommend reducing weekly dexamethasone to 16 mg with plans for further taper He will continue to check his blood sugar. If his serum blood sugar is greater than 200, he will continue same dose of insulin therapy at 5 units at night. However, if his serum blood sugar is less than 200, he reduce the insulin dose to 3 units. I will continue to manage his diabetes along with the patient on his monthly visit

## 2016-02-07 NOTE — Assessment & Plan Note (Signed)
He has generalized deconditioning  His sleep during daytime but wakes up several times at nighttime due to nocturia. Hopefully, reducing the dose of dexamethasone will help. I recommend he goes back to the [facility] and increase his activity. The patient appears motivated

## 2016-02-07 NOTE — Telephone Encounter (Signed)
Per staff message I have scheduled appt and notified the scheduler 

## 2016-02-10 LAB — MULTIPLE MYELOMA PANEL, SERUM
ALBUMIN SERPL ELPH-MCNC: 3.6 g/dL (ref 2.9–4.4)
ALPHA 1: 0.2 g/dL (ref 0.0–0.4)
ALPHA2 GLOB SERPL ELPH-MCNC: 0.8 g/dL (ref 0.4–1.0)
Albumin/Glob SerPl: 1.6 (ref 0.7–1.7)
B-Globulin SerPl Elph-Mcnc: 0.9 g/dL (ref 0.7–1.3)
Gamma Glob SerPl Elph-Mcnc: 0.4 g/dL (ref 0.4–1.8)
Globulin, Total: 2.3 g/dL (ref 2.2–3.9)
IGG (IMMUNOGLOBIN G), SERUM: 595 mg/dL — AB (ref 700–1600)
IGM (IMMUNOGLOBIN M), SRM: 14 mg/dL — AB (ref 20–172)
IgA, Qn, Serum: 33 mg/dL — ABNORMAL LOW (ref 61–437)
TOTAL PROTEIN: 5.9 g/dL — AB (ref 6.0–8.5)

## 2016-03-02 ENCOUNTER — Telehealth: Payer: Self-pay | Admitting: Hematology and Oncology

## 2016-03-02 NOTE — Telephone Encounter (Signed)
Appointments rescheduled per patient request. The patient came to scheduling to reschedule appointments for 12/21 to a later time; early January per the need to attend a prior engagement during the week of 12/21 through the week of 12/25.

## 2016-03-04 ENCOUNTER — Telehealth: Payer: Self-pay | Admitting: *Deleted

## 2016-03-04 NOTE — Telephone Encounter (Signed)
Per LOS I have scheduled appts and notified the scheduler 

## 2016-03-12 ENCOUNTER — Other Ambulatory Visit: Payer: BC Managed Care – PPO

## 2016-03-12 ENCOUNTER — Ambulatory Visit: Payer: BC Managed Care – PPO

## 2016-03-12 ENCOUNTER — Ambulatory Visit: Payer: BC Managed Care – PPO | Admitting: Hematology and Oncology

## 2016-03-24 ENCOUNTER — Ambulatory Visit (HOSPITAL_BASED_OUTPATIENT_CLINIC_OR_DEPARTMENT_OTHER): Payer: BC Managed Care – PPO

## 2016-03-24 ENCOUNTER — Emergency Department (HOSPITAL_BASED_OUTPATIENT_CLINIC_OR_DEPARTMENT_OTHER)
Admission: EM | Admit: 2016-03-24 | Discharge: 2016-03-24 | Disposition: A | Discharge status: Home / Self Care | Payer: BC Managed Care – PPO | Attending: Physician Assistant | Admitting: Physician Assistant

## 2016-03-24 ENCOUNTER — Other Ambulatory Visit (HOSPITAL_BASED_OUTPATIENT_CLINIC_OR_DEPARTMENT_OTHER): Payer: BC Managed Care – PPO

## 2016-03-24 ENCOUNTER — Emergency Department (HOSPITAL_BASED_OUTPATIENT_CLINIC_OR_DEPARTMENT_OTHER): Payer: BC Managed Care – PPO

## 2016-03-24 ENCOUNTER — Ambulatory Visit (HOSPITAL_BASED_OUTPATIENT_CLINIC_OR_DEPARTMENT_OTHER): Payer: BC Managed Care – PPO | Admitting: Hematology and Oncology

## 2016-03-24 ENCOUNTER — Encounter: Payer: Self-pay | Admitting: Hematology and Oncology

## 2016-03-24 ENCOUNTER — Telehealth: Payer: Self-pay | Admitting: Hematology and Oncology

## 2016-03-24 VITALS — BP 140/69 | HR 92 | Temp 99.3°F | Resp 17 | Ht 72.0 in | Wt 208.7 lb

## 2016-03-24 VITALS — Temp 99.4°F

## 2016-03-24 DIAGNOSIS — I2510 Atherosclerotic heart disease of native coronary artery without angina pectoris: Secondary | ICD-10-CM | POA: Diagnosis not present

## 2016-03-24 DIAGNOSIS — Z8547 Personal history of malignant neoplasm of testis: Secondary | ICD-10-CM | POA: Diagnosis not present

## 2016-03-24 DIAGNOSIS — E119 Type 2 diabetes mellitus without complications: Secondary | ICD-10-CM | POA: Insufficient documentation

## 2016-03-24 DIAGNOSIS — Z9481 Bone marrow transplant status: Secondary | ICD-10-CM

## 2016-03-24 DIAGNOSIS — C9001 Multiple myeloma in remission: Secondary | ICD-10-CM

## 2016-03-24 DIAGNOSIS — Z87891 Personal history of nicotine dependence: Secondary | ICD-10-CM | POA: Insufficient documentation

## 2016-03-24 DIAGNOSIS — Z794 Long term (current) use of insulin: Secondary | ICD-10-CM | POA: Diagnosis not present

## 2016-03-24 DIAGNOSIS — R635 Abnormal weight gain: Secondary | ICD-10-CM | POA: Diagnosis not present

## 2016-03-24 DIAGNOSIS — Z79899 Other long term (current) drug therapy: Secondary | ICD-10-CM | POA: Insufficient documentation

## 2016-03-24 DIAGNOSIS — D638 Anemia in other chronic diseases classified elsewhere: Secondary | ICD-10-CM

## 2016-03-24 DIAGNOSIS — Z7982 Long term (current) use of aspirin: Secondary | ICD-10-CM | POA: Insufficient documentation

## 2016-03-24 DIAGNOSIS — B3781 Candidal esophagitis: Secondary | ICD-10-CM

## 2016-03-24 DIAGNOSIS — B349 Viral infection, unspecified: Secondary | ICD-10-CM | POA: Diagnosis not present

## 2016-03-24 DIAGNOSIS — R509 Fever, unspecified: Secondary | ICD-10-CM | POA: Diagnosis present

## 2016-03-24 LAB — COMPREHENSIVE METABOLIC PANEL
ALBUMIN: 4 g/dL (ref 3.5–5.0)
ALBUMIN: 3.9 g/dL (ref 3.5–5.0)
ALK PHOS: 60 U/L (ref 40–150)
ALK PHOS: 40 U/L (ref 38–126)
ALT: 58 U/L — AB (ref 0–55)
ALT: 50 U/L (ref 17–63)
ANION GAP: 9 (ref 5–15)
AST: 32 U/L (ref 5–34)
AST: 35 U/L (ref 15–41)
Anion Gap: 11 mEq/L (ref 3–11)
BILIRUBIN TOTAL: 1.46 mg/dL — AB (ref 0.20–1.20)
BUN: 12.5 mg/dL (ref 7.0–26.0)
BUN: 12 mg/dL (ref 6–20)
CALCIUM: 9.6 mg/dL (ref 8.4–10.4)
CHLORIDE: 99 mmol/L — AB (ref 101–111)
CO2: 23 mEq/L (ref 22–29)
CO2: 24 mmol/L (ref 22–32)
CREATININE: 1.3 mg/dL (ref 0.7–1.3)
Calcium: 8.9 mg/dL (ref 8.9–10.3)
Chloride: 104 mEq/L (ref 98–109)
Creatinine, Ser: 1.3 mg/dL — ABNORMAL HIGH (ref 0.61–1.24)
EGFR: 58 mL/min/{1.73_m2} — ABNORMAL LOW (ref >90)
GFR calc non Af Amer: 57 mL/min — ABNORMAL LOW (ref >60)
GLUCOSE: 156 mg/dL — AB (ref 70–140)
GLUCOSE: 151 mg/dL — AB (ref 65–99)
POTASSIUM: 3.3 mmol/L — AB (ref 3.5–5.1)
Potassium: 3.5 mEq/L (ref 3.5–5.1)
SODIUM: 138 meq/L (ref 136–145)
SODIUM: 132 mmol/L — AB (ref 135–145)
TOTAL PROTEIN: 7 g/dL (ref 6.4–8.3)
Total Bilirubin: 1.4 mg/dL — ABNORMAL HIGH (ref 0.3–1.2)
Total Protein: 6.5 g/dL (ref 6.5–8.1)

## 2016-03-24 LAB — CBC WITH DIFFERENTIAL/PLATELET
BASO%: 0.3 % (ref 0.0–2.0)
BASOS ABS: 0 10*3/uL (ref 0.0–0.1)
BASOS PCT: 0 %
Basophils Absolute: 0 10*3/uL (ref 0.0–0.1)
EOS ABS: 0.2 10*3/uL (ref 0.0–0.5)
EOS ABS: 0.2 10*3/uL (ref 0.0–0.7)
EOS PCT: 2 %
EOS%: 1.8 % (ref 0.0–7.0)
HCT: 37.8 % — ABNORMAL LOW (ref 38.4–49.9)
HCT: 33.5 % — ABNORMAL LOW (ref 39.0–52.0)
HEMOGLOBIN: 12.6 g/dL — AB (ref 13.0–17.1)
HEMOGLOBIN: 11 g/dL — AB (ref 13.0–17.0)
LYMPH#: 1.1 10*3/uL (ref 0.9–3.3)
LYMPH%: 13 % — ABNORMAL LOW (ref 14.0–49.0)
LYMPHS ABS: 1.3 10*3/uL (ref 0.7–4.0)
Lymphocytes Relative: 15 %
MCH: 31.9 pg (ref 27.2–33.4)
MCH: 31.8 pg (ref 26.0–34.0)
MCHC: 33.4 g/dL (ref 32.0–36.0)
MCHC: 32.8 g/dL (ref 30.0–36.0)
MCV: 95.7 fL (ref 79.3–98.0)
MCV: 96.8 fL (ref 78.0–100.0)
MONO ABS: 1.8 10*3/uL — AB (ref 0.1–1.0)
MONO#: 1.4 10*3/uL — ABNORMAL HIGH (ref 0.1–0.9)
MONO%: 15.9 % — AB (ref 0.0–14.0)
Monocytes Relative: 21 %
NEUT%: 69 % (ref 39.0–75.0)
NEUTROS ABS: 5.9 10*3/uL (ref 1.5–6.5)
NEUTROS PCT: 62 %
Neutro Abs: 5.3 10*3/uL (ref 1.7–7.7)
PLATELETS: 147 10*3/uL — AB (ref 150–400)
Platelets: 159 10*3/uL (ref 140–400)
RBC: 3.95 10*6/uL — ABNORMAL LOW (ref 4.20–5.82)
RBC: 3.46 MIL/uL — ABNORMAL LOW (ref 4.22–5.81)
RDW: 14.6 % (ref 11.0–14.6)
RDW: 14 % (ref 11.5–15.5)
WBC: 8.6 10*3/uL (ref 4.0–10.3)
WBC: 8.6 10*3/uL (ref 4.0–10.5)

## 2016-03-24 LAB — URINALYSIS, ROUTINE W REFLEX MICROSCOPIC
Bilirubin Urine: NEGATIVE
Glucose, UA: NEGATIVE mg/dL
Hgb urine dipstick: NEGATIVE
Ketones, ur: NEGATIVE mg/dL
Leukocytes, UA: NEGATIVE
Nitrite: NEGATIVE
Protein, ur: NEGATIVE mg/dL
Specific Gravity, Urine: 1.01 (ref 1.005–1.030)
pH: 5.5 (ref 5.0–8.0)

## 2016-03-24 LAB — I-STAT CG4 LACTIC ACID, ED: Lactic Acid, Venous: 1.13 mmol/L (ref 0.5–1.9)

## 2016-03-24 LAB — TROPONIN I: Troponin I: <0.03 ng/mL (ref <0.03)

## 2016-03-24 MED ORDER — GUAIFENESIN-CODEINE 100-10 MG/5ML PO SOLN: 5.0000 mL | Freq: Four times a day (QID) | ORAL | 0 refills | Status: DC | PRN

## 2016-03-24 MED ORDER — ACETAMINOPHEN 500 MG PO TABS
1000.0000 mg | ORAL_TABLET | Freq: Once | ORAL | Status: AC
  Administered 2016-03-24: 1000 mg via ORAL
  Filled 2016-03-24: qty 2

## 2016-03-24 MED ORDER — ZOLEDRONIC ACID 4 MG/5ML IV CONC
3.5000 mg | Freq: Once | INTRAVENOUS | Status: AC
  Administered 2016-03-24: 3.5 mg via INTRAVENOUS
  Filled 2016-03-24: qty 4.38

## 2016-03-24 MED ORDER — BENZONATATE 100 MG PO CAPS: 100.0000 mg | ORAL_CAPSULE | Freq: Three times a day (TID) | ORAL | 0 refills | Status: DC | PRN

## 2016-03-24 MED ORDER — SODIUM CHLORIDE 0.9 % IV SOLN
Freq: Once | INTRAVENOUS | Status: AC
  Administered 2016-03-24: 15:00:00 via INTRAVENOUS

## 2016-03-24 NOTE — Assessment & Plan Note (Signed)
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 His last blood work showed that he is in remission. I think the risks of taking high-dose dexamethasone outweighs the benefit In addition to steroid-induced diabetes, the patient has evidence of muscle wasting, weight gain, frequent nocturia and mild signs of cushingoid features. We discussed that reducing the dose weekly dexamethasone with eventual plan for taper would not compromise the efficacy of his treatment. Starting next week, I recommend reducing weekly dexamethasone to 16 mg with plans for further taper He will continue to check his blood sugar. If his serum blood sugar is greater than 200, he will continue same dose of insulin therapy at 5 units at night. However, if his serum blood sugar is less than 200, he reduce the insulin dose to 3 units.

## 2016-03-24 NOTE — Assessment & Plan Note (Signed)
He has excessive weight gain due to the holidays and reduced mobility. We discussed exercise and dietary modification

## 2016-03-24 NOTE — Telephone Encounter (Signed)
Appointments scheduled per 1/2 LOS. Patient given AVS report and calendars with future scheduled appointments. °

## 2016-03-24 NOTE — Patient Instructions (Signed)

## 2016-03-24 NOTE — Assessment & Plan Note (Addendum)
His treatment is switched to Ninlaro. He tolerated that very well with no side effects. His last blood work showed that he is still in remission. I am concerned about side effects of high-dose dexamethasone and recommend reducing the weekly dexamethasone to 16 mg. I think it is prudent to get him off dexamethasone in view of multiple side-effects including weight gain, candidiasis and steroid induced diabetes He will continue vitamin D with calcium and monthly Zometa. He denies signs or symptoms of osteonecrosis of the jaw I will see him back in 3 months for further review

## 2016-03-24 NOTE — Assessment & Plan Note (Signed)
The patient is returning to [facility] soon He will continue antimicrobial prophylaxis as directed.

## 2016-03-24 NOTE — Assessment & Plan Note (Signed)
This is likely due to recent treatment. The patient denies recent history of bleeding such as epistaxis, hematuria or hematochezia. He is asymptomatic from the anemia. I will observe for now.  He does not require transfusion now. I will continue the chemotherapy at current dose without dosage adjustment.  If the anemia gets progressive worse in the future, I might have to delay his treatment or adjust the chemotherapy dose.  

## 2016-03-24 NOTE — Progress Notes (Signed)
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 congo red staing reveals amyloid deposition within some of the amorphous material and within blood vessels. Normal cytogenetics.      02/14/2014 Imaging    PET CT scan showed diffuse skeletal involvement      02/26/2014 - 04/25/2014 Chemotherapy    He is started on chemotherapy with Velcade, Cytoxan and dexamethasone. Cytoxan was discontinued due to recurrent admission and recent clostridium Diff infection.  Velcade was suspected to be a cause of fever that was discontinued.      03/09/2014 - 03/11/2014 Hospital Admission    He was admitted to the hospital with fevers and chills and was diagnosed with pneumonia      03/22/2014 - 03/26/2014 Hospital Admission    He is admitted to the hospital again for  fever, chills and possible pneumonia      04/19/2014 - 04/22/2014 Hospital Admission    He is admitted to the hospital for fever, chills and diarrhea and was subsequently found to have C. difficile infection      05/04/2014 - 05/05/2014 Hospital Admission    The patient was admitted to the hospital again with fever and chills. No source of infection was found and he was not given antibiotics.      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      06/04/2014 - 06/14/2014 Chemotherapy    He was restarted back on Revlimid with dexamethasone only. Treatment was put on hold due to recurrent fever of unknown origin      01/14/2015 - 01/14/2015 Chemotherapy    He received high dose melphalan      01/15/2015 Bone Marrow Transplant    He received autologous BMT      01/22/2015 - 01/27/2015 Hospital Admission    [facility] was found to have E Coli bacteremia      02/28/2015 - 11/04/2015 Chemotherapy    He began maintenance Zometa only. Revlimid is discontinued due to skin rash and intolerance      04/17/2015 Bone Marrow Biopsy    [facility]  05/21/2015 Bone Marrow Biopsy    Re[facility] showed no residual plasma cells or amyloidosis      05/29/2015 Adverse Reaction    He developed skin rash. Treatment was placed on hold      09/04/2015 Imaging    US venous Doppler is negative for DVT      11/05/2015 -  Chemotherapy    He received Ninlaro along with dexamethasone      03/05/2016 Procedure    He underwent EGD and colonoscopy which showed esophageal candidiasis, treated by GI physician with Nystatin suspension       INTERVAL HISTORY: Please see below for problem oriented charting. He returns for follow-up He has  excessive weight gain over the holidays due to reduced mobility and over-eating Multiple members of family is sick with flu-like illness in his household He feels well apart from mild nasal drainage He had recent EGD & colonoscopy Reports revealed esophageal candidiasis. He was treated with anti-fungals He denies bone pain. No recent dental issues  REVIEW OF SYSTEMS:   Constitutional: Denies fevers, chills or abnormal weight loss Eyes: Denies blurriness of vision Ears, nose, mouth, throat, and face: Denies mucositis or sore throat Respiratory: Denies cough, dyspnea or wheezes Cardiovascular: Denies palpitation, chest discomfort or lower extremity swelling Gastrointestinal:  Denies nausea, heartburn or change in bowel habits Skin: Denies abnormal skin rashes Lymphatics: Denies new lymphadenopathy or easy bruising Neurological:Denies numbness, tingling or new weaknesses Behavioral/Psych: Mood is stable, no new changes  All other systems were reviewed with the patient and are negative.  I have reviewed the past medical history, past surgical history, social history and family history with the patient and they are unchanged from previous note.  ALLERGIES:  is allergic to velcade [bortezomib].  MEDICATIONS:  Current Outpatient Prescriptions  Medication Sig Dispense Refill  . acyclovir (ZOVIRAX) 800 MG tablet TK 1 T PO BID  2  . aspirin EC 81 MG tablet Take by mouth.    . calcium carbonate (CALCIUM 600) 600 MG TABS tablet Take by mouth.    . Cholecalciferol (VITAMIN D3) 2000 UNITS TABS Take 1 tablet by mouth daily.     Marland Kitchen dexamethasone (DECADRON) 4 MG tablet Take 40 mg by mouth once a week. Mondays    . folic acid (FOLVITE) 1 MG tablet TAKE 1 TABLET BY MOUTH EVERY DAY 30 tablet 0  . insulin glargine (LANTUS) 100 UNIT/ML injection Inject 5-6 Units into the skin at bedtime. Reported on 07/04/2015    . ixazomib citrate (NINLARO) 4 MG capsule Take 4 mg by mouth once a week. Take on an empty  stomach 1hr before or 2hrs after food. Do not crush, chew or open.  Once a week on Mondays x 3 weeks on and 1 week off    . levothyroxine (SYNTHROID, LEVOTHROID) 25 MCG tablet     . metFORMIN (GLUCOPHAGE) 500 MG tablet Take 500 mg by mouth 2 (two) times daily with a meal. Reported on 07/04/2015    . Multiple Vitamin (MULTIVITAMIN) tablet Take 1 tablet by mouth daily.    . pantoprazole (PROTONIX) 40 MG tablet Take 40 mg by mouth.    . potassium chloride (MICRO-K) 10 MEQ CR capsule Take 10 mEq by mouth 2 (two) times daily.     . rosuvastatin (CRESTOR) 5 MG tablet     . HYDROcodone-homatropine (HYCODAN) 5-1.5 MG/5ML syrup Take 5 mLs by mouth every 6 (six) hours as needed for cough. (Patient not taking: Reported on  03/24/2016) 473 mL 0  . pravastatin (PRAVACHOL) 40 MG tablet Reported on 07/04/2015  0  . prochlorperazine (COMPAZINE) 10 MG tablet Take 10 mg by mouth. Reported on 07/04/2015     No current facility-administered medications for this visit.     PHYSICAL EXAMINATION: ECOG PERFORMANCE STATUS: 1 - Symptomatic but completely ambulatory  Vitals:   03/24/16 1349  BP: 140/69  Pulse: 92  Resp: 17  Temp: 99.3 F (37.4 C)   Filed Weights   03/24/16 1349  Weight: 208 lb 11.2 oz (94.7 kg)    GENERAL:alert, no distress and comfortable. He is moderately obese SKIN: skin color, texture, turgor are normal, no rashes or significant lesions EYES: normal, Conjunctiva are pink and non-injected, sclera clear OROPHARYNX:no exudate, no erythema and lips, buccal mucosa, and tongue normal  NECK: supple, thyroid normal size, non-tender, without nodularity LYMPH:  no palpable lymphadenopathy in the cervical, axillary or inguinal LUNGS: clear to auscultation and percussion with normal breathing effort HEART: regular rate & rhythm and no murmurs and no lower extremity edema ABDOMEN:abdomen soft, non-tender and normal bowel sounds Musculoskeletal:no cyanosis of digits and no clubbing  NEURO: alert &  oriented x 3 with fluent speech, no focal motor/sensory deficits  LABORATORY DATA:  I have reviewed the data as listed    Component Value Date/Time   NA 138 03/24/2016 1330   K 3.5 03/24/2016 1330   CL 107 05/27/2014 1000   CO2 23 03/24/2016 1330   GLUCOSE 156 (H) 03/24/2016 1330   BUN 12.5 03/24/2016 1330   CREATININE 1.3 03/24/2016 1330   CALCIUM 9.6 03/24/2016 1330   PROT 7.0 03/24/2016 1330   ALBUMIN 4.0 03/24/2016 1330   AST 32 03/24/2016 1330   ALT 58 (H) 03/24/2016 1330   ALKPHOS 60 03/24/2016 1330   BILITOT 1.46 (H) 03/24/2016 1330   GFRNONAA 70 (L) 05/27/2014 1000   GFRAA 81 (L) 05/27/2014 1000    No results found for: SPEP, UPEP  Lab Results  Component Value Date   WBC 8.6 03/24/2016   NEUTROABS 5.9 03/24/2016   HGB 12.6 (L) 03/24/2016   HCT 37.8 (L) 03/24/2016   MCV 95.7 03/24/2016   PLT 159 03/24/2016      Chemistry      Component Value Date/Time   NA 138 03/24/2016 1330   K 3.5 03/24/2016 1330   CL 107 05/27/2014 1000   CO2 23 03/24/2016 1330   BUN 12.5 03/24/2016 1330   CREATININE 1.3 03/24/2016 1330      Component Value Date/Time   CALCIUM 9.6 03/24/2016 1330   ALKPHOS 60 03/24/2016 1330   AST 32 03/24/2016 1330   ALT 58 (H) 03/24/2016 1330   BILITOT 1.46 (H) 03/24/2016 1330      ASSESSMENT & PLAN:  Multiple myeloma in remission ([place]) His treatment is switched to Ninlaro. He tolerated that very well with no side effects. His last blood work showed that he is still in remission. I am concerned about side effects of high-dose dexamethasone and recommend reducing the weekly dexamethasone to 16 mg. I think it is prudent to get him off dexamethasone in view of multiple side-effects including weight gain, candidiasis and steroid induced diabetes He will continue vitamin D with calcium and monthly Zometa. He denies signs or symptoms of osteonecrosis of the jaw I will see him back in 3 months for further review  S/P bone marrow transplant  [facility]) The patient is returning to [facility] soon He will continue antimicrobial prophylaxis as directed.  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 His last blood work showed that he is in remission. I think the risks of taking high-dose dexamethasone outweighs the benefit In addition to steroid-induced diabetes, the patient has evidence of muscle wasting, weight gain, frequent nocturia and mild signs of cushingoid features. We discussed that reducing the dose weekly dexamethasone with eventual plan for taper would not compromise the efficacy of his treatment. Starting next week, I recommend reducing weekly dexamethasone to 16 mg with plans for further taper He will continue to check his blood sugar. If his serum blood sugar is greater than 200, he will continue same dose of insulin therapy at 5 units at night. However, if his serum blood sugar is less than 200, he reduce the insulin dose to 3 units.  Weight gain He has excessive weight gain due to the holidays and reduced mobility. We discussed exercise and dietary modification  Anemia in chronic illness This is likely due to recent treatment. The patient denies recent history of bleeding such as epistaxis, hematuria or hematochezia. He is asymptomatic from the anemia. I will observe for now.  He does not require transfusion now. I will continue the chemotherapy at current dose without dosage adjustment.  If the anemia gets progressive worse in the future, I might have to delay his treatment or adjust the chemotherapy dose.   Esophageal candidiasis ([place]) This is induced by corticosteroids I recommend future reduction of dexamethasone   Orders Placed This Encounter  Procedures  . CBC with Differential/Platelet    Standing Status:   Standing    Number of Occurrences:   9    Standing Expiration Date:   03/24/2017  . Comprehensive  metabolic panel    Standing Status:   Standing    Number of Occurrences:   9    Standing Expiration Date:   03/24/2017  . Kappa/lambda light chains    Standing Status:   Future    Standing Expiration Date:   04/28/2017  . Multiple Myeloma Panel (SPEP&IFE w/QIG)    Standing Status:   Future    Standing Expiration Date:   04/28/2017   All questions were answered. The patient knows to call the clinic with any problems, questions or concerns. No barriers to learning was detected. I spent 20 minutes counseling the patient face to face. The total time spent in the appointment was 30 minutes and more than 50% was on counseling and review of test results     Heath Lark, MD 03/24/2016 8:33 PM

## 2016-03-24 NOTE — Assessment & Plan Note (Signed)
This is induced by corticosteroids I recommend future reduction of dexamethasone

## 2016-03-24 NOTE — ED Provider Notes (Signed)
[facility] DEPT [facility] Provider Note   CSN: [id number] Arrival date & time: 03/24/16  1956   By signing my name below, I, Tyler Willis, attest that this documentation has been prepared under the direction and in the presence of Josely Moffat Julio Alm, MD  Electronically Signed: Delton Willis, ED Scribe. 03/24/16. 9:22 PM.   History   Chief Complaint Chief Complaint  Patient presents with  . Fever   The history is provided by the patient. No language interpreter was used.   HPI Comments:  Tyler Worrel. is a 66 y.o. male, with a hx of cancer (continued chemotherapy), who presents to the Emergency Department complaining of gradual onset fever (tmax 102.8) x today. Pt also reports chills, a cough x several days and continued right leg pain. He has had tylenol in the ED with relief. Pt denies abdominal pain, any other associated symptoms and any other modifying factors at this time. Pt was seen by his oncologist, Dr. Heath Lark, today.   Past Medical History:  Diagnosis Date  . Bilateral calf pain 07/02/2014  . [facility] 2004   testicular  . Cough 07/02/2014  . Diabetes mellitus without complication ([place])   . GERD (gastroesophageal reflux disease)   . Multiple myeloma ([place]) 02/19/2014  . Multiple myeloma [facility])     Patient Active Problem List   Diagnosis Date Noted  . Weight gain 03/24/2016  . Esophageal candidiasis ([place]) 03/24/2016  . Steroid-induced diabetes ([place]) 01/08/2016  . Physical deconditioning 01/08/2016  . Hypercalcemia 01/08/2016  . History of hypokalemia 01/08/2016  . Leukocytosis 01/07/2016  . Elevated serum creatinine 07/18/2015  . Varicose veins of right lower extremity with pain 07/06/2015  . Drug-induced skin rash 05/30/2015  . Poor dentition 05/30/2015  . Chronic back pain 05/30/2015  . S/P bone marrow transplant ([place]) 02/04/2015  . Cough 07/02/2014  . Anemia in chronic illness 05/25/2014  . Weakness generalized 05/25/2014  . 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 Diarrhea 04/21/2014  . Anemia of chronic disease   . Hypotension 03/22/2014  . HLD (hyperlipidemia) 03/09/2014  . GERD (gastroesophageal reflux disease) 03/09/2014  . Insomnia 03/06/2014  . Bleeding hemorrhoid 03/05/2014  . 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 Coronary artery calcification seen on CAT scan 02/22/2014  . Diabetes mellitus without complication ([place]) 12/13/3005  . 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 Lesion of mandible 02/22/2014  . Multiple myeloma in remission ([place]) 02/19/2014  . Diverticulosis 02/06/2014  . Lytic bone lesions on xray 02/06/2014    Past Surgical History:  Procedure Laterality Date  . ORIF ULNAR FRACTURE    . sternum biopsy     for dx of multiple myeloma  . TESTICLE REMOVAL Right 2004       Home Medications    Prior to Admission medications   Medication Sig Start Date End Date Taking? Authorizing Provider  acyclovir (ZOVIRAX) 800 MG tablet TK 1 T PO BID 04/03/15   Historical Provider, MD  aspirin EC 81 MG tablet Take by mouth.    Historical Provider, MD  calcium carbonate (CALCIUM 600) 600 MG TABS tablet Take by mouth.    Historical Provider, MD  Cholecalciferol (VITAMIN D3) 2000 UNITS TABS Take 1 tablet by mouth daily.     Historical Provider, MD  dexamethasone (DECADRON) 4 MG tablet Take 40 mg by mouth once a week. Mondays    Historical Provider, MD  folic acid (FOLVITE) 1 MG tablet TAKE 1 TABLET BY MOUTH EVERY DAY 10/04/14   Heath Lark,  MD  HYDROcodone-homatropine (HYCODAN) 5-1.5 MG/5ML syrup Take 5 mLs by mouth every 6 (six) hours as needed for cough. Patient not taking: Reported on 03/24/2016 11/21/15   Heath Lark, MD  insulin glargine (LANTUS) 100 UNIT/ML injection Inject 5-6 Units into the skin at bedtime. Reported on 07/04/2015    Historical Provider, MD  ixazomib citrate (NINLARO) 4 MG capsule Take 4 mg by mouth once a week. Take on an empty stomach 1hr before or 2hrs after food. Do not crush, chew or open.   Once a week on Mondays x 3 weeks on and 1 week off    Historical Provider, MD  levothyroxine (SYNTHROID, LEVOTHROID) 25 MCG tablet  01/01/16   Historical Provider, MD  metFORMIN (GLUCOPHAGE) 500 MG tablet Take 500 mg by mouth 2 (two) times daily with a meal. Reported on 07/04/2015    Historical Provider, MD  Multiple Vitamin (MULTIVITAMIN) tablet Take 1 tablet by mouth daily.    Historical Provider, MD  pantoprazole (PROTONIX) 40 MG tablet Take 40 mg by mouth. 01/16/15   Historical Provider, MD  potassium chloride (MICRO-K) 10 MEQ CR capsule Take 10 mEq by mouth 2 (two) times daily.  01/27/15   Historical Provider, MD  pravastatin (PRAVACHOL) 40 MG tablet Reported on 07/04/2015 03/23/15   Historical Provider, MD  prochlorperazine (COMPAZINE) 10 MG tablet Take 10 mg by mouth. Reported on 07/04/2015 01/04/15   Historical Provider, MD  rosuvastatin (CRESTOR) 5 MG tablet  01/03/16   Historical Provider, MD    Family History Family History  Problem Relation Age of Onset  . Diabetes Father   . Cancer Maternal Uncle     lung cancer  . Cancer Cousin     myeloma    Social History Social History  Substance Use Topics  . Smoking status: Former Smoker    Types: Pipe    Quit date: 05/24/1985  . Smokeless tobacco: Never Used  . Alcohol use No     Allergies   Velcade [bortezomib]   Review of Systems Review of Systems  Constitutional: Positive for chills and fever.  Cardiovascular: Negative for leg swelling.  Gastrointestinal: Negative for abdominal pain.  Musculoskeletal: Positive for myalgias.  All other systems reviewed and are negative.  Physical Exam Updated Vital Signs BP 106/74   Pulse 75   Temp 100 F (37.8 C) (Oral)   Resp 14   Ht 6' (1.829 m)   Wt 208 lb (94.3 kg)   SpO2 96%   BMI 28.21 kg/m   Physical Exam  Constitutional: He is oriented to person, place, and time. He appears well-developed and well-nourished. No distress.  HENT:  Head: Normocephalic and atraumatic.    Mouth/Throat: Mucous membranes are normal.  Eyes: Conjunctivae are normal.  Cardiovascular: Normal rate.   Pulmonary/Chest: Effort normal.  Abdominal: He exhibits no distension.  Lymphadenopathy:    He has no cervical adenopathy.  Neurological: He is alert and oriented to person, place, and time.  Skin: Skin is warm and dry.  Psychiatric: He has a normal mood and affect.  Nursing note and vitals reviewed.  ED Treatments / Results  DIAGNOSTIC STUDIES:  Oxygen Saturation is 98% on RA, normal by my interpretation.    COORDINATION OF CARE:  9:14 PM Discussed treatment plan with pt at bedside and pt agreed to plan.  Labs (all labs ordered are listed, but only abnormal results are displayed) Labs Reviewed  CBC WITH DIFFERENTIAL/PLATELET - Abnormal; Notable for the following:       Result  Value   RBC 3.46 (*)    Hemoglobin 11.0 (*)    HCT 33.5 (*)    Platelets 147 (*)    Monocytes Absolute 1.8 (*)    All other components within normal limits  COMPREHENSIVE METABOLIC PANEL - Abnormal; Notable for the following:    Sodium 132 (*)    Potassium 3.3 (*)    Chloride 99 (*)    Glucose, Bld 151 (*)    Creatinine, Ser 1.30 (*)    Total Bilirubin 1.4 (*)    GFR calc non Af Amer 57 (*)    All other components within normal limits  URINE CULTURE  CULTURE, BLOOD (ROUTINE X 2)  CULTURE, BLOOD (ROUTINE X 2)  URINALYSIS, ROUTINE W REFLEX MICROSCOPIC  TROPONIN I  I-STAT CG4 LACTIC ACID, ED    EKG  EKG Interpretation None       Radiology Dg Chest 2 View  Result Date: 03/24/2016 CLINICAL DATA:  Cough for few days, fever today. History of multiple myeloma. EXAM: CHEST  2 VIEW COMPARISON:  Chest x-rays dated 11/04/2015 and 06/27/2014. FINDINGS: Heart size and mediastinal contours are normal. Again noted is chronic bibasilar scarring or atelectasis. No new lung findings. No evidence of pneumonia. No pleural effusion or pneumothorax seen. Scattered lucent lesions again noted within the  ribs, compatible with the given history of multiple myeloma. No discrete fracture line or displaced fracture fragment identified. Mild degenerative spurring noted within the thoracic spine. Superficial soft tissues about the chest are unremarkable. IMPRESSION: No acute findings.  No evidence of pneumonia or pulmonary edema. Electronically Signed   By: Franki Cabot M.D.   On: 03/24/2016 20:51    Procedures Procedures (including critical care time)  Medications Ordered in ED Medications  acetaminophen (TYLENOL) tablet 1,000 mg (1,000 mg Oral Given 03/24/16 2005)     Initial Impression / Assessment and Plan / ED Course  I have reviewed the triage vital signs and the nursing notes.  Pertinent labs & imaging results that were available during my care of the patient were reviewed by me and considered in my medical decision making (see chart for details).  Clinical Course    I personally performed the services described in this documentation, which was scribed in my presence. The recorded information has been reviewed and is accurate.   Patient is a 66 year old male with multiple myeloma presenting here. Patient seen earlier today with symptoms of upper respiratory illness by his oncologist. Blood work is done and oncologist does not want any further testing. Patient had fever home so came here to emergency department again. Patient's wife has had viral upper respiratory symptoms.  Given history of cancer and current oral chemotherapy use, blood cultures were completed as well as workup. Discussed with patient's oncologist. His oncologist says that he comes in often and has taken so many abx that he got C. difficile. She does not think is appropriate to do broad spectrum antibiosis at this time given likely viral ideology. Patient appears well has normal vital signs we will discharge home with follow-up with oncologist   Patient is comfortable, ambulatory, and taking PO at time of discharge.   Patient expressed understanding about return precautions.    Final Clinical Impressions(s) / ED Diagnoses   Final diagnoses:  None    New Prescriptions New Prescriptions   No medications on file     Elverna Caffee Julio Alm, MD 03/24/16 2344

## 2016-03-25 ENCOUNTER — Telehealth: Payer: Self-pay | Admitting: *Deleted

## 2016-03-25 LAB — KAPPA/LAMBDA LIGHT CHAINS
Ig Kappa Free Light Chain: 6.8 mg/L (ref 3.3–19.4)
Ig Lambda Free Light Chain: 6.1 mg/L (ref 5.7–26.3)
KAPPA/LAMBDA FLC RATIO: 1.11 (ref 0.26–1.65)

## 2016-03-25 NOTE — Telephone Encounter (Signed)
Spoke with Triage nurse at [facility]. According to Weyman Rodney, NP it is OK to taper dexamethasone, but need to monitor myeloma markers closely.

## 2016-03-25 NOTE — Telephone Encounter (Signed)
Spoke with Triage nurse.Dr Alvy Bimler wants to see if Dr Harvel Ricks will consider tapering patient off dexamethasone due to too much side effects.

## 2016-03-26 LAB — MULTIPLE MYELOMA PANEL, SERUM
ALBUMIN SERPL ELPH-MCNC: 3.5 g/dL (ref 2.9–4.4)
Albumin/Glob SerPl: 1.3 (ref 0.7–1.7)
Alpha 1: 0.2 g/dL (ref 0.0–0.4)
Alpha2 Glob SerPl Elph-Mcnc: 1 g/dL (ref 0.4–1.0)
B-Globulin SerPl Elph-Mcnc: 1 g/dL (ref 0.7–1.3)
Gamma Glob SerPl Elph-Mcnc: 0.5 g/dL (ref 0.4–1.8)
Globulin, Total: 2.7 g/dL (ref 2.2–3.9)
IGA/IMMUNOGLOBULIN A, SERUM: 31 mg/dL — AB (ref 61–437)
IgG, Qn, Serum: 553 mg/dL — ABNORMAL LOW (ref 700–1600)
IgM, Qn, Serum: 11 mg/dL — ABNORMAL LOW (ref 20–172)
TOTAL PROTEIN: 6.2 g/dL (ref 6.0–8.5)

## 2016-03-26 LAB — URINE CULTURE: Culture: NO GROWTH

## 2016-03-30 LAB — CULTURE, BLOOD (ROUTINE X 2)
Culture: NO GROWTH
Culture: NO GROWTH

## 2016-04-16 ENCOUNTER — Telehealth: Payer: Self-pay

## 2016-04-16 NOTE — Telephone Encounter (Signed)
Patient requesting new application for handicap placard. Form completed by Dr. Alvy Bimler and placed in outgoing mail.

## 2016-04-28 ENCOUNTER — Ambulatory Visit (HOSPITAL_BASED_OUTPATIENT_CLINIC_OR_DEPARTMENT_OTHER): Payer: BC Managed Care – PPO

## 2016-04-28 ENCOUNTER — Other Ambulatory Visit (HOSPITAL_BASED_OUTPATIENT_CLINIC_OR_DEPARTMENT_OTHER): Payer: BC Managed Care – PPO

## 2016-04-28 VITALS — BP 124/61 | HR 82 | Temp 98.2°F | Resp 16

## 2016-04-28 DIAGNOSIS — C9001 Multiple myeloma in remission: Secondary | ICD-10-CM

## 2016-04-28 LAB — CBC WITH DIFFERENTIAL/PLATELET
BASO%: 0.2 % (ref 0.0–2.0)
BASOS ABS: 0 10*3/uL (ref 0.0–0.1)
EOS%: 0.3 % (ref 0.0–7.0)
Eosinophils Absolute: 0.1 10*3/uL (ref 0.0–0.5)
HEMATOCRIT: 38 % — AB (ref 38.4–49.9)
HGB: 12.9 g/dL — ABNORMAL LOW (ref 13.0–17.1)
LYMPH#: 2.2 10*3/uL (ref 0.9–3.3)
LYMPH%: 12.1 % — ABNORMAL LOW (ref 14.0–49.0)
MCH: 32.4 pg (ref 27.2–33.4)
MCHC: 34 g/dL (ref 32.0–36.0)
MCV: 95.3 fL (ref 79.3–98.0)
MONO#: 2.3 10*3/uL — ABNORMAL HIGH (ref 0.1–0.9)
MONO%: 12.9 % (ref 0.0–14.0)
NEUT#: 13.3 10*3/uL — ABNORMAL HIGH (ref 1.5–6.5)
NEUT%: 74.5 % (ref 39.0–75.0)
PLATELETS: 272 10*3/uL (ref 140–400)
RBC: 3.98 10*6/uL — ABNORMAL LOW (ref 4.20–5.82)
RDW: 15.2 % — ABNORMAL HIGH (ref 11.0–14.6)
WBC: 17.9 10*3/uL — ABNORMAL HIGH (ref 4.0–10.3)

## 2016-04-28 LAB — COMPREHENSIVE METABOLIC PANEL
ALT: 65 U/L — AB (ref 0–55)
ANION GAP: 11 meq/L (ref 3–11)
AST: 37 U/L — ABNORMAL HIGH (ref 5–34)
Albumin: 4.4 g/dL (ref 3.5–5.0)
Alkaline Phosphatase: 55 U/L (ref 40–150)
BUN: 17.6 mg/dL (ref 7.0–26.0)
CALCIUM: 10.3 mg/dL (ref 8.4–10.4)
CHLORIDE: 105 meq/L (ref 98–109)
CO2: 24 mEq/L (ref 22–29)
CREATININE: 1.2 mg/dL (ref 0.7–1.3)
EGFR: 63 mL/min/{1.73_m2} — AB (ref >90)
Glucose: 141 mg/dl — ABNORMAL HIGH (ref 70–140)
Potassium: 3.9 mEq/L (ref 3.5–5.1)
Sodium: 140 mEq/L (ref 136–145)
Total Bilirubin: 1.42 mg/dL — ABNORMAL HIGH (ref 0.20–1.20)
Total Protein: 7.4 g/dL (ref 6.4–8.3)

## 2016-04-28 MED ORDER — ZOLEDRONIC ACID 4 MG/5ML IV CONC
3.5000 mg | Freq: Once | INTRAVENOUS | Status: AC
  Administered 2016-04-28: 3.5 mg via INTRAVENOUS
  Filled 2016-04-28: qty 4.38

## 2016-04-28 NOTE — Patient Instructions (Signed)

## 2016-05-26 ENCOUNTER — Ambulatory Visit (HOSPITAL_BASED_OUTPATIENT_CLINIC_OR_DEPARTMENT_OTHER): Payer: BC Managed Care – PPO

## 2016-05-26 ENCOUNTER — Other Ambulatory Visit (HOSPITAL_BASED_OUTPATIENT_CLINIC_OR_DEPARTMENT_OTHER): Payer: BC Managed Care – PPO

## 2016-05-26 VITALS — BP 116/68 | HR 71 | Temp 98.1°F | Resp 18

## 2016-05-26 DIAGNOSIS — C9001 Multiple myeloma in remission: Secondary | ICD-10-CM

## 2016-05-26 LAB — COMPREHENSIVE METABOLIC PANEL
ALT: 40 U/L (ref 0–55)
ANION GAP: 12 meq/L — AB (ref 3–11)
AST: 21 U/L (ref 5–34)
Albumin: 4 g/dL (ref 3.5–5.0)
Alkaline Phosphatase: 48 U/L (ref 40–150)
BUN: 15.6 mg/dL (ref 7.0–26.0)
CHLORIDE: 106 meq/L (ref 98–109)
CO2: 22 meq/L (ref 22–29)
CREATININE: 1.4 mg/dL — AB (ref 0.7–1.3)
Calcium: 9.5 mg/dL (ref 8.4–10.4)
EGFR: 54 mL/min/{1.73_m2} — ABNORMAL LOW (ref >90)
Glucose: 278 mg/dl — ABNORMAL HIGH (ref 70–140)
POTASSIUM: 3.6 meq/L (ref 3.5–5.1)
Sodium: 140 mEq/L (ref 136–145)
Total Bilirubin: 1.2 mg/dL (ref 0.20–1.20)
Total Protein: 6.5 g/dL (ref 6.4–8.3)

## 2016-05-26 LAB — CBC WITH DIFFERENTIAL/PLATELET
BASO%: 0.1 % (ref 0.0–2.0)
Basophils Absolute: 0 10e3/uL (ref 0.0–0.1)
EOS%: 0.3 % (ref 0.0–7.0)
Eosinophils Absolute: 0.1 10e3/uL (ref 0.0–0.5)
HCT: 35 % — ABNORMAL LOW (ref 38.4–49.9)
HGB: 11.8 g/dL — ABNORMAL LOW (ref 13.0–17.1)
LYMPH%: 13.7 % — ABNORMAL LOW (ref 14.0–49.0)
MCH: 31.8 pg (ref 27.2–33.4)
MCHC: 33.7 g/dL (ref 32.0–36.0)
MCV: 94.3 fL (ref 79.3–98.0)
MONO#: 1.3 10e3/uL — ABNORMAL HIGH (ref 0.1–0.9)
MONO%: 8.9 % (ref 0.0–14.0)
NEUT#: 11.4 10e3/uL — ABNORMAL HIGH (ref 1.5–6.5)
NEUT%: 77 % — ABNORMAL HIGH (ref 39.0–75.0)
Platelets: 212 10e3/uL (ref 140–400)
RBC: 3.71 10e6/uL — ABNORMAL LOW (ref 4.20–5.82)
RDW: 14.7 % — ABNORMAL HIGH (ref 11.0–14.6)
WBC: 14.7 10e3/uL — ABNORMAL HIGH (ref 4.0–10.3)
lymph#: 2 10e3/uL (ref 0.9–3.3)

## 2016-05-26 MED ORDER — SODIUM CHLORIDE 0.9% FLUSH
10.0000 mL | INTRAVENOUS | Status: AC | PRN
  Filled 2016-05-26: qty 10

## 2016-05-26 MED ORDER — ZOLEDRONIC ACID 4 MG/5ML IV CONC
3.5000 mg | Freq: Once | INTRAVENOUS | Status: AC
  Administered 2016-05-26: 3.5 mg via INTRAVENOUS
  Filled 2016-05-26: qty 4.38

## 2016-05-26 MED ORDER — HEPARIN SOD (PORK) LOCK FLUSH 100 UNIT/ML IV SOLN
500.0000 [IU] | Freq: Once | INTRAVENOUS | Status: AC
  Filled 2016-05-26: qty 5

## 2016-05-26 NOTE — Patient Instructions (Signed)

## 2016-06-23 ENCOUNTER — Encounter: Payer: Self-pay | Admitting: Hematology and Oncology

## 2016-06-23 ENCOUNTER — Ambulatory Visit (HOSPITAL_BASED_OUTPATIENT_CLINIC_OR_DEPARTMENT_OTHER): Payer: BC Managed Care – PPO | Admitting: Hematology and Oncology

## 2016-06-23 ENCOUNTER — Ambulatory Visit: Payer: BC Managed Care – PPO

## 2016-06-23 ENCOUNTER — Other Ambulatory Visit (HOSPITAL_BASED_OUTPATIENT_CLINIC_OR_DEPARTMENT_OTHER): Payer: BC Managed Care – PPO

## 2016-06-23 DIAGNOSIS — C9001 Multiple myeloma in remission: Secondary | ICD-10-CM

## 2016-06-23 DIAGNOSIS — R7989 Other specified abnormal findings of blood chemistry: Secondary | ICD-10-CM

## 2016-06-23 DIAGNOSIS — E119 Type 2 diabetes mellitus without complications: Secondary | ICD-10-CM

## 2016-06-23 DIAGNOSIS — Z9481 Bone marrow transplant status: Secondary | ICD-10-CM | POA: Diagnosis not present

## 2016-06-23 LAB — CBC WITH DIFFERENTIAL/PLATELET
BASO%: 0.2 % (ref 0.0–2.0)
BASOS ABS: 0 10*3/uL (ref 0.0–0.1)
EOS%: 0.5 % (ref 0.0–7.0)
Eosinophils Absolute: 0.1 10*3/uL (ref 0.0–0.5)
HCT: 36.1 % — ABNORMAL LOW (ref 38.4–49.9)
HGB: 12.4 g/dL — ABNORMAL LOW (ref 13.0–17.1)
LYMPH%: 15.4 % (ref 14.0–49.0)
MCH: 32.4 pg (ref 27.2–33.4)
MCHC: 34.4 g/dL (ref 32.0–36.0)
MCV: 94.2 fL (ref 79.3–98.0)
MONO#: 1.2 10*3/uL — ABNORMAL HIGH (ref 0.1–0.9)
MONO%: 8.7 % (ref 0.0–14.0)
NEUT#: 10.5 10*3/uL — ABNORMAL HIGH (ref 1.5–6.5)
NEUT%: 75.2 % — AB (ref 39.0–75.0)
Platelets: 215 10*3/uL (ref 140–400)
RBC: 3.83 10*6/uL — AB (ref 4.20–5.82)
RDW: 14.9 % — ABNORMAL HIGH (ref 11.0–14.6)
WBC: 13.9 10*3/uL — ABNORMAL HIGH (ref 4.0–10.3)
lymph#: 2.1 10*3/uL (ref 0.9–3.3)

## 2016-06-23 LAB — COMPREHENSIVE METABOLIC PANEL
ALBUMIN: 4.1 g/dL (ref 3.5–5.0)
ALK PHOS: 48 U/L (ref 40–150)
ALT: 45 U/L (ref 0–55)
AST: 26 U/L (ref 5–34)
Anion Gap: 12 mEq/L — ABNORMAL HIGH (ref 3–11)
BUN: 12.6 mg/dL (ref 7.0–26.0)
CALCIUM: 9.3 mg/dL (ref 8.4–10.4)
CHLORIDE: 107 meq/L (ref 98–109)
CO2: 21 mEq/L — ABNORMAL LOW (ref 22–29)
CREATININE: 1.5 mg/dL — AB (ref 0.7–1.3)
EGFR: 50 mL/min/{1.73_m2} — ABNORMAL LOW (ref >90)
Glucose: 207 mg/dl — ABNORMAL HIGH (ref 70–140)
Potassium: 3.6 mEq/L (ref 3.5–5.1)
SODIUM: 140 meq/L (ref 136–145)
Total Bilirubin: 1.41 mg/dL — ABNORMAL HIGH (ref 0.20–1.20)
Total Protein: 6.6 g/dL (ref 6.4–8.3)

## 2016-06-23 NOTE — Assessment & Plan Note (Signed)
His treatment is switched to Ninlaro. He tolerated that very well with no side effects. His last blood work in March 2018 drawn at [facility] showed that he is still in remission. I am concerned about side effects of high-dose dexamethasone and recommend taper of weekly dexamethasone. I think it is prudent to get him off dexamethasone in view of multiple side-effects including weight gain, candidiasis and steroid induced diabetes He will continue vitamin D with calcium and monthly Zometa. He denies signs or symptoms of osteonecrosis of the jaw His serum creatinine is elevated and I recommend holding off Zometa today I recommend Zometa every 3 months I notice a lot of duplication of workup and treatment between here and [facility] I recommend he follows at [facility] exclusively in the future and I will be happy to see him back here if needed

## 2016-06-23 NOTE — Assessment & Plan Note (Signed)
He has mild acute on chronic renal failure likely due to dehydration I will hold off Zometa today He has received Zometa for 2 years I recommend changing his Zometa to every 3 months Since he is returning back to [facility] every 3 months, I would defer to his transplant physician to organize bisphosphonate therapy at [facility]

## 2016-06-23 NOTE — Assessment & Plan Note (Addendum)
The patient is returning to [facility] every 3 months He will continue antimicrobial prophylaxis as directed.

## 2016-06-23 NOTE — Assessment & Plan Note (Signed)
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 His last blood work showed that he is in remission. I think the risks of taking high-dose dexamethasone outweighs the benefit In addition to steroid-induced diabetes, the patient has evidence of muscle wasting, weight gain, frequent nocturia and mild signs of cushingoid features. We discussed that taper would not compromise the efficacy of his treatment.

## 2016-06-23 NOTE — Progress Notes (Signed)
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 congo red staing reveals amyloid deposition within some of the amorphous material and within blood vessels. Normal cytogenetics.      02/14/2014 Imaging    PET CT scan showed diffuse skeletal involvement      02/26/2014 - 04/25/2014 Chemotherapy    He is started on chemotherapy with Velcade, Cytoxan and dexamethasone. Cytoxan was discontinued due to recurrent admission and recent clostridium Diff infection.  Velcade was suspected to be a cause of fever that was discontinued.      03/09/2014 - 03/11/2014 Hospital Admission    He was admitted to the hospital with fevers and chills and was diagnosed with pneumonia      03/22/2014 - 03/26/2014 Hospital Admission    He is admitted to the hospital again for  fever, chills and possible pneumonia      04/19/2014 - 04/22/2014 Hospital Admission    He is admitted to the hospital for fever, chills and diarrhea and was subsequently found to have C. difficile infection      05/04/2014 - 05/05/2014 Hospital Admission    The patient was admitted to the hospital again with fever and chills. No source of infection was found and he was not given antibiotics.      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      06/04/2014 - 06/14/2014 Chemotherapy    He was restarted back on Revlimid with dexamethasone only. Treatment was put on hold due to recurrent fever of unknown origin      01/14/2015 - 01/14/2015 Chemotherapy    He received high dose melphalan      01/15/2015 Bone Marrow Transplant    He received autologous BMT      01/22/2015 - 01/27/2015 Hospital Admission    [facility] was found to have E Coli bacteremia      02/28/2015 - 11/04/2015 Chemotherapy    He began maintenance Zometa only. Revlimid is discontinued due to skin rash and intolerance      04/17/2015 Bone Marrow Biopsy    [facility]  05/21/2015 Bone Marrow Biopsy    Repeat bone [facility] biopsy at [facility] showed no residual plasma cells or amyloidosis      05/29/2015 Adverse Reaction    He developed skin rash. Treatment was placed on hold      09/04/2015 Imaging    US venous Doppler is negative for DVT      11/05/2015 -  Chemotherapy    He received Ninlaro along with dexamethasone      03/05/2016 Procedure    He underwent EGD and colonoscopy which showed esophageal candidiasis, treated by GI physician with Nystatin suspension       INTERVAL HISTORY: Please see below for problem oriented charting. He is seen today with his wife He  is still on high-dose steroids despite effort to get him off dexamethasone He is in the process of applying for possible permanent disability off work He denies recent infection No recent bone pain Denies recent dental issues He is active with all activities of daily living and has started to exercise He continues to have memory problems and concentration since transplant that is affecting some of his work related activities I have reviewed information from [facility] and his recent blood work  REVIEW OF SYSTEMS:   Constitutional: Denies fevers, chills or abnormal weight loss Eyes: Denies blurriness of vision Ears, nose, mouth, throat, and face: Denies mucositis or sore throat Respiratory: Denies cough, dyspnea or wheezes Cardiovascular: Denies palpitation, chest discomfort or lower extremity swelling Gastrointestinal:  Denies nausea, heartburn or change in bowel habits Skin: Denies abnormal skin rashes Lymphatics: Denies new lymphadenopathy or easy bruising Neurological:Denies numbness, tingling or new weaknesses Behavioral/Psych: Mood is stable, no new changes  All other systems were reviewed with the patient and are negative.  I have reviewed the past medical history, past surgical history, social history and family history with the patient and they are unchanged from previous note.  ALLERGIES:  is allergic to velcade [bortezomib].  MEDICATIONS:  Current Outpatient Prescriptions  Medication Sig Dispense Refill  . acyclovir (ZOVIRAX) 800 MG tablet TK 1 T PO BID  2  . aspirin EC 81 MG tablet Take by mouth.    . calcium carbonate (CALCIUM 600) 600 MG TABS tablet Take by mouth.    . Cholecalciferol (VITAMIN D3) 2000 UNITS TABS Take 1 tablet by mouth daily.     Marland Kitchen dexamethasone (DECADRON) 4 MG tablet Take 40 mg by mouth once a week. Mondays    . folic acid (FOLVITE) 1 MG tablet TAKE 1 TABLET BY MOUTH EVERY DAY 30 tablet 0  . insulin glargine (LANTUS) 100  UNIT/ML injection Inject 5-6 Units into the skin at bedtime. Reported on 07/04/2015    . ixazomib citrate (NINLARO) 4 MG capsule Take 4 mg by mouth once a week. Take on an empty stomach 1hr before or 2hrs after food. Do not crush, chew or open.  Once a week on Mondays x 3 weeks on and 1 week off    . levothyroxine (SYNTHROID, LEVOTHROID) 25 MCG tablet     . metFORMIN (GLUCOPHAGE) 500 MG tablet Take 500 mg by mouth 2 (two) times daily with a meal. Reported on 07/04/2015    . Multiple Vitamin (MULTIVITAMIN) tablet Take 1 tablet by mouth daily.    . pantoprazole (PROTONIX) 40 MG tablet Take 40 mg by mouth.    . rosuvastatin (CRESTOR) 5 MG tablet     . benzonatate (TESSALON PERLES) 100 MG capsule Take 1 capsule (100 mg total)  by mouth 3 (three) times daily as needed for cough. (Patient not taking: Reported on 06/23/2016) 20 capsule 0  . guaiFENesin-codeine 100-10 MG/5ML syrup Take 5 mLs by mouth every 6 (six) hours as needed for cough. (Patient not taking: Reported on 06/23/2016) 120 mL 0  . HYDROcodone-homatropine (HYCODAN) 5-1.5 MG/5ML syrup Take 5 mLs by mouth every 6 (six) hours as needed for cough. (Patient not taking: Reported on 03/24/2016) 473 mL 0  . potassium chloride (MICRO-K) 10 MEQ CR capsule Take 10 mEq by mouth 2 (two) times daily.     . pravastatin (PRAVACHOL) 40 MG tablet Reported on 07/04/2015  0  . prochlorperazine (COMPAZINE) 10 MG tablet Take 10 mg by mouth. Reported on 07/04/2015     No current facility-administered medications for this visit.    Facility-Administered Medications Ordered in Other Visits  Medication Dose Route Frequency Provider Last Rate Last Dose  . heparin lock flush 100 unit/mL  500 Units Intravenous Once Heath Lark, MD      . sodium chloride flush (NS) 0.9 % injection 10 mL  10 mL Intravenous PRN Heath Lark, MD        PHYSICAL EXAMINATION: ECOG PERFORMANCE STATUS: 1 - Symptomatic but completely ambulatory  Vitals:   06/23/16 1501  BP: 110/65  Pulse: 82  Resp:  17  Temp: 98.4 F (36.9 C)   Filed Weights   06/23/16 1501  Weight: 208 lb 12.8 oz (94.7 kg)    GENERAL:alert, no distress and comfortable SKIN: skin color, texture, turgor are normal, no rashes or significant lesions EYES: normal, Conjunctiva are pink and non-injected, sclera clear Musculoskeletal:no cyanosis of digits and no clubbing  NEURO: alert & oriented x 3 with fluent speech, no focal motor/sensory deficits  LABORATORY DATA:  I have reviewed the data as listed    Component Value Date/Time   NA 140 06/23/2016 1429   K 3.6 06/23/2016 1429   CL 99 (L) 03/24/2016 2120   CO2 21 (L) 06/23/2016 1429   GLUCOSE 207 (H) 06/23/2016 1429   BUN 12.6 06/23/2016 1429   CREATININE 1.5 (H) 06/23/2016 1429   CALCIUM 9.3 06/23/2016 1429   PROT 6.6 06/23/2016 1429   ALBUMIN 4.1 06/23/2016 1429   AST 26 06/23/2016 1429   ALT 45 06/23/2016 1429   ALKPHOS 48 06/23/2016 1429   BILITOT 1.41 (H) 06/23/2016 1429   GFRNONAA 57 (L) 03/24/2016 2120   GFRAA >60 03/24/2016 2120    No results found for: SPEP, UPEP  Lab Results  Component Value Date   WBC 13.9 (H) 06/23/2016   NEUTROABS 10.5 (H) 06/23/2016   HGB 12.4 (L) 06/23/2016   HCT 36.1 (L) 06/23/2016   MCV 94.2 06/23/2016   PLT 215 06/23/2016      Chemistry      Component Value Date/Time   NA 140 06/23/2016 1429   K 3.6 06/23/2016 1429   CL 99 (L) 03/24/2016 2120   CO2 21 (L) 06/23/2016 1429   BUN 12.6 06/23/2016 1429   CREATININE 1.5 (H) 06/23/2016 1429      Component Value Date/Time   CALCIUM 9.3 06/23/2016 1429   ALKPHOS 48 06/23/2016 1429   AST 26 06/23/2016 1429   ALT 45 06/23/2016 1429   BILITOT 1.41 (H) 06/23/2016 1429       ASSESSMENT & PLAN:  Multiple myeloma in remission ([place]) His treatment is switched to Ninlaro. He tolerated that very well with no side effects. His last blood work in March 2018 drawn at [facility]  showed that he is still in remission. I am concerned about side effects of high-dose  dexamethasone and recommend taper of weekly dexamethasone. I think it is prudent to get him off dexamethasone in view of multiple side-effects including weight gain, candidiasis and steroid induced diabetes He will continue vitamin D with calcium and monthly Zometa. He denies signs or symptoms of osteonecrosis of the jaw His serum creatinine is elevated and I recommend holding off Zometa today I recommend Zometa every 3 months I notice a lot of duplication of workup and treatment between here and [facility] I recommend he follows at [facility] exclusively in the future and I will be happy to see him back here if needed  S/P bone marrow transplant [facility]) The patient is returning to [facility] every 3 months He will continue antimicrobial prophylaxis as directed.  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 His last blood work showed that he is in remission. I think the risks of taking high-dose dexamethasone outweighs the benefit In addition to steroid-induced diabetes, the patient has evidence of muscle wasting, weight gain, frequent nocturia and mild signs of cushingoid features. We discussed that taper would not compromise the efficacy of his treatment.  Elevated serum creatinine He has mild acute on chronic renal failure likely due to dehydration I will hold off Zometa today He has received Zometa for 2 years I recommend changing his Zometa to every 3 months Since he is returning back to [facility] every 3 months, I would defer to his transplant physician to organize bisphosphonate therapy at [facility]   No orders of the defined types were placed in this encounter.  All questions were answered. The patient knows to call the clinic with any problems, questions or concerns. No barriers to learning was detected. I spent 20 minutes counseling the patient face  to face. The total time spent in the appointment was 25 minutes and more than 50% was on counseling and review of test results     Heath Lark, MD 06/23/2016 4:11 PM

## 2016-07-13 ENCOUNTER — Telehealth: Payer: Self-pay | Admitting: Hematology and Oncology

## 2016-07-13 NOTE — Telephone Encounter (Signed)
Spoke with wife Velva Harman on 07/07/16 to advise Disability forms are completed and ready for pickup at the front reception desk. scanned completed copy into Epic

## 2016-07-31 ENCOUNTER — Emergency Department (HOSPITAL_BASED_OUTPATIENT_CLINIC_OR_DEPARTMENT_OTHER): Payer: BC Managed Care – PPO

## 2016-07-31 ENCOUNTER — Telehealth: Payer: Self-pay | Admitting: *Deleted

## 2016-07-31 ENCOUNTER — Encounter (HOSPITAL_BASED_OUTPATIENT_CLINIC_OR_DEPARTMENT_OTHER): Payer: Self-pay

## 2016-07-31 ENCOUNTER — Emergency Department (HOSPITAL_BASED_OUTPATIENT_CLINIC_OR_DEPARTMENT_OTHER)
Admission: EM | Admit: 2016-07-31 | Discharge: 2016-07-31 | Disposition: A | Discharge status: Home / Self Care | Payer: BC Managed Care – PPO | Attending: Emergency Medicine | Admitting: Emergency Medicine

## 2016-07-31 ENCOUNTER — Telehealth: Payer: Self-pay

## 2016-07-31 DIAGNOSIS — Z87891 Personal history of nicotine dependence: Secondary | ICD-10-CM | POA: Diagnosis not present

## 2016-07-31 DIAGNOSIS — M79602 Pain in left arm: Secondary | ICD-10-CM

## 2016-07-31 DIAGNOSIS — Z8547 Personal history of malignant neoplasm of testis: Secondary | ICD-10-CM | POA: Diagnosis not present

## 2016-07-31 DIAGNOSIS — Z7982 Long term (current) use of aspirin: Secondary | ICD-10-CM | POA: Diagnosis not present

## 2016-07-31 DIAGNOSIS — Z7984 Long term (current) use of oral hypoglycemic drugs: Secondary | ICD-10-CM | POA: Diagnosis not present

## 2016-07-31 DIAGNOSIS — M79601 Pain in right arm: Secondary | ICD-10-CM

## 2016-07-31 DIAGNOSIS — E119 Type 2 diabetes mellitus without complications: Secondary | ICD-10-CM | POA: Diagnosis not present

## 2016-07-31 DIAGNOSIS — M79603 Pain in arm, unspecified: Secondary | ICD-10-CM | POA: Diagnosis not present

## 2016-07-31 DIAGNOSIS — Z79899 Other long term (current) drug therapy: Secondary | ICD-10-CM | POA: Diagnosis not present

## 2016-07-31 NOTE — Telephone Encounter (Signed)
Wife Tyler Willis called and left message that she needed a call back. Called Tyler Willis wife back, she wants to know why did Dr. Alvy Bimler tell her husband to call if he ever needs anything. When they called earlier today they were told to to consult another doctor because patient has been released from Dr. Calton Dach care. Wife states that they are going to the ER now. I apologized for the confusion.

## 2016-07-31 NOTE — ED Triage Notes (Signed)
Pt also c/o abrasion to right elbow that he wants to be checked

## 2016-07-31 NOTE — Telephone Encounter (Signed)
I gave him the triage nurse the option to call [facility] or we can order US Doppler here

## 2016-07-31 NOTE — Telephone Encounter (Signed)
This RN received call from pt stating onset yesterday with continued presence of Left arm swelling.    Pt denies any fevers, SOB or chest discomfort.  Arm is tender.  No swelling in R arm " just my knuckles are a little sore ".  Per med review pt is now off of the dexamethasone and is only on the Ninlaro.  Pt has no current appointments for follow up at this office.  Above discussed with Dr Alvy Bimler who states per last visit and discussion of follow care by this office or by [facility]- pt decided to continue at [facility]. Recommended pt to contact them for above for advice and continuity of care.  This RN discussed above with pt who verbalized understanding including if any problems with above he will call this office back.

## 2016-07-31 NOTE — Discharge Instructions (Signed)
Please use cold compress to the areas affected. Please use Tylenol as needed for pain. Please follow-up with your primary care provider regarding today's visit. Please also follow up with your oncologist. Make sure to continue stretching use light exercise for your arms.   Contact a health care provider if: Your pain is getting worse. Your pain is not relieved with medicines. You lose function in the area of the pain if the pain is in your arms, legs, or neck.

## 2016-07-31 NOTE — ED Triage Notes (Signed)
c/o "knot" and swelling to left UE x 2 days-denies injury-advised by oncologist to come to ED to r/o blood clot-NAD-steady gait

## 2016-07-31 NOTE — ED Provider Notes (Signed)
[place] DEPT [facility] Provider Note   CSN: [id number] Arrival date & time: 07/31/16  1130     History   Chief Complaint Chief Complaint  Patient presents with  . Arm Problem  . Abrasion    HPI Tyler Nettle. is a 66 y.o. male PMhx of Multiple myeloma, DM, currently being treated by oncologist Presents today with complaints of left and right arm soreness and swelling from his hands to his elbow started yesterday. He states it was first just left arm and now it is starting to affect his right arm. He reports his pain as achy, throbbing, worsening, constant, 9/10 pain. He denies striking anything for symptoms. He reports living his arms on his stomach makes symptoms better, activity and moving his arms make his symptoms worse. Denies any redness to the area. He denies any trauma or change in exercise or activity. He denies any fevers, chills, nausea, vomiting, chest pain, shortness of breath. He denies any previous DVT or PE. He denies taking any blood thinners, but does admit to taking a daily aspirin. He reports currently being under cancer treatment and was referred here by his oncologist to rule out a DVT.  The history is provided by the patient. No language interpreter was used.    Past Medical History:  Diagnosis Date  . Bilateral calf pain 07/02/2014  . 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 Cough 07/02/2014  . Diabetes mellitus without complication ([place])   . GERD (gastroesophageal reflux disease)   . Multiple myeloma ([place]) 02/19/2014  . Multiple myeloma [facility])     Patient Active Problem List   Diagnosis Date Noted  . Weight gain 03/24/2016  . Esophageal candidiasis ([place]) 03/24/2016  . 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 Physical deconditioning 01/08/2016  . Hypercalcemia 01/08/2016  . History of hypokalemia 01/08/2016  . Leukocytosis 01/07/2016  . Elevated serum creatinine 07/18/2015  . Varicose veins of right lower extremity with pain 07/06/2015  .  Drug-induced skin rash 05/30/2015  . Poor dentition 05/30/2015  . Chronic back pain 05/30/2015  . 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 Cough 07/02/2014  . Anemia in chronic illness 05/25/2014  . Weakness generalized 05/25/2014  . 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 Diarrhea 04/21/2014  . Anemia of chronic disease   . Hypotension 03/22/2014  . HLD (hyperlipidemia) 03/09/2014  . GERD (gastroesophageal reflux disease) 03/09/2014  . Insomnia 03/06/2014  . Bleeding hemorrhoid 03/05/2014  . 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 Coronary artery calcification seen on CAT scan 02/22/2014  . Diabetes mellitus without complication ([place]) 88/32/5498  . 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 Lesion of mandible 02/22/2014  . Multiple myeloma in remission ([place]) 02/19/2014  . Diverticulosis 02/06/2014  . Lytic bone lesions on xray 02/06/2014    Past Surgical History:  Procedure Laterality Date  . ORIF ULNAR FRACTURE    . sternum biopsy     for dx of multiple myeloma  . TESTICLE REMOVAL Right 2004       Home Medications    Prior to Admission medications   Medication Sig Start Date End Date Taking? Authorizing Provider  acyclovir (ZOVIRAX) 800 MG tablet TK 1 T PO BID 04/03/15   [provider]  aspirin EC 81 MG tablet Take by mouth.    [provider]  benzonatate (TESSALON PERLES) 100 MG capsule Take 1 capsule (100 mg total) by mouth 3 (three) times daily as needed for cough. Patient not taking: Reported on 06/23/2016 03/24/16  Mackuen, Courteney Lyn, MD  calcium carbonate (CALCIUM 600) 600 MG TABS tablet Take by mouth.    [provider]  Cholecalciferol (VITAMIN D3) 2000 UNITS TABS Take 1 tablet by mouth daily.     [provider]  dexamethasone (DECADRON) 4 MG tablet Take 40 mg by mouth once a week. Mondays    [provider]  folic acid (FOLVITE) 1 MG tablet TAKE 1 TABLET BY MOUTH EVERY DAY 10/04/14   Heath Lark, MD    guaiFENesin-codeine 100-10 MG/5ML syrup Take 5 mLs by mouth every 6 (six) hours as needed for cough. Patient not taking: Reported on 06/23/2016 03/24/16   Mackuen, Courteney Lyn, MD  HYDROcodone-homatropine (HYCODAN) 5-1.5 MG/5ML syrup Take 5 mLs by mouth every 6 (six) hours as needed for cough. Patient not taking: Reported on 03/24/2016 11/21/15   Heath Lark, MD  insulin glargine (LANTUS) 100 UNIT/ML injection Inject 5-6 Units into the skin at bedtime. Reported on 07/04/2015    [provider]  ixazomib citrate (NINLARO) 4 MG capsule Take 4 mg by mouth once a week. Take on an empty stomach 1hr before or 2hrs after food. Do not crush, chew or open.  Once a week on Mondays x 3 weeks on and 1 week off    [provider]  levothyroxine (SYNTHROID, LEVOTHROID) 25 MCG tablet  01/01/16   [provider]  metFORMIN (GLUCOPHAGE) 500 MG tablet Take 500 mg by mouth 2 (two) times daily with a meal. Reported on 07/04/2015    [provider]  Multiple Vitamin (MULTIVITAMIN) tablet Take 1 tablet by mouth daily.    [provider]  pantoprazole (PROTONIX) 40 MG tablet Take 40 mg by mouth. 01/16/15   [provider]  potassium chloride (MICRO-K) 10 MEQ CR capsule Take 10 mEq by mouth 2 (two) times daily.  01/27/15   [provider]  pravastatin (PRAVACHOL) 40 MG tablet Reported on 07/04/2015 03/23/15   [provider]  prochlorperazine (COMPAZINE) 10 MG tablet Take 10 mg by mouth. Reported on 07/04/2015 01/04/15   [provider]  rosuvastatin (CRESTOR) 5 MG tablet  01/03/16   [provider]    Family History Family History  Problem Relation Age of Onset  . Diabetes Father   . Cancer Maternal Uncle        lung cancer  . Cancer Cousin        myeloma    Social History Social History  Substance Use Topics  . Smoking status: Former Smoker    Types: Pipe    Quit date: 05/24/1985  . Smokeless tobacco: Never Used  . Alcohol use  No     Allergies   Velcade [bortezomib]   Review of Systems Review of Systems  Constitutional: Negative for chills and fever.  Respiratory: Negative for shortness of breath.   Cardiovascular: Negative for chest pain.  Gastrointestinal: Negative for abdominal pain, diarrhea, nausea and vomiting.  Musculoskeletal: Positive for arthralgias and myalgias.  Skin: Negative for color change and wound.  All other systems reviewed and are negative.    Physical Exam Updated Vital Signs BP 122/75   Pulse 72   Temp 99.2 F (37.3 C) (Oral)   Resp 17   Ht 5' 10"  (1.778 m)   Wt 95.3 kg   SpO2 97%   BMI 30.13 kg/m   Physical Exam  Constitutional: He appears well-developed and well-nourished. No distress.  Well appearing  HENT:  Head: Normocephalic and atraumatic.  Nose: Nose normal.  Eyes: Conjunctivae and EOM are normal.  Neck: Normal range of motion. No JVD present. No tracheal deviation present.  Cardiovascular: Normal rate, normal heart sounds and intact distal pulses.   No murmur heard. 2+ pulses to UE bilaterally.   Pulmonary/Chest: Effort normal and breath sounds normal. No stridor. No respiratory distress. He has no wheezes.  Normal work of breathing. No respiratory distress noted.   Abdominal: Soft.  Musculoskeletal: Normal range of motion.  Hands appear to be mildly swollen bilaterally and mild swelling to the forearms bilaterally. Mild tenderness to palpation to both forearms and hands bilaterally. No redness noted. No tenderness to palpation to upper arms. No swelling noted to upper arms. No erythema noted throughout upper arms. Hands and arms are warm and equal bilaterally. Upper arms, elbow, wrists, fingers free range of motion. There is slight limited range of motion to hands bilaterally secondary to pain and swelling.   Right elbow with obvious deformity that he states is chronic and has been there since he was 66 years old. He denies any recent trauma. Patient does  have a small abrasion over the posterior aspect of the elbow with no bruising, surrounding redness, tenderness to palpation, no active bleeding. He states is not associated with today's symptoms.   Neurological: He is alert. No sensory deficit.  Sensation intact to BUE. Muscle strength 5/5 to BUE against resistance to shoulder flexion and extension, elbow flexion and extension, supination and pronation, wrist flexion and extension and finger flexion and extension bilaterally.  Skin: Skin is warm. Capillary refill takes less than 2 seconds.  Psychiatric: He has a normal mood and affect. His behavior is normal.  Nursing note and vitals reviewed.    ED Treatments / Results  Labs (all labs ordered are listed, but only abnormal results are displayed) Labs Reviewed - No data to display  EKG  EKG Interpretation None       Radiology US Venous Img Upper Bilat  Result Date: 07/31/2016 CLINICAL DATA:  Bilateral upper extremity pain and swelling. EXAM: BILATERAL UPPER EXTREMITY VENOUS DOPPLER ULTRASOUND TECHNIQUE: Gray-scale sonography with graded compression, as well as color Doppler and duplex ultrasound were performed to evaluate the bilateral upper extremity deep venous systems from the level of the subclavian vein and including the jugular, axillary, basilic, radial, ulnar and upper cephalic vein. Spectral Doppler was utilized to evaluate flow at rest and with distal augmentation maneuvers. COMPARISON:  None. FINDINGS: RIGHT UPPER EXTREMITY Internal Jugular Vein: No evidence of thrombus. Normal compressibility, respiratory phasicity and response to augmentation. Subclavian Vein: No evidence of thrombus. Normal compressibility, respiratory phasicity and response to augmentation. Axillary Vein: No evidence of thrombus. Normal compressibility, respiratory phasicity and response to augmentation. Cephalic Vein: No evidence of thrombus. Normal compressibility, respiratory phasicity and response to  augmentation. Basilic Vein: No evidence of thrombus. Normal compressibility, respiratory phasicity and response to augmentation. Brachial Veins: No evidence of thrombus. Normal compressibility, respiratory phasicity and response to augmentation. Radial Veins: No evidence of thrombus. Normal compressibility, respiratory phasicity and response to augmentation. Ulnar Veins: No evidence of thrombus. Normal compressibility, respiratory phasicity and response to augmentation. Venous Reflux:  None. Other Findings:  None. LEFT UPPER EXTREMITY Internal Jugular Vein: No evidence of thrombus. Normal compressibility, respiratory phasicity and response to augmentation. Subclavian Vein: No evidence of thrombus. Normal compressibility, respiratory phasicity and response to augmentation. Axillary Vein: No evidence of thrombus. Normal compressibility, respiratory phasicity and response to augmentation. Cephalic Vein: No evidence of thrombus. Normal compressibility, respiratory phasicity and response  to augmentation. Basilic Vein: No evidence of thrombus. Normal compressibility, respiratory phasicity and response to augmentation. Brachial Veins: No evidence of thrombus. Normal compressibility, respiratory phasicity and response to augmentation. Radial Veins: No evidence of thrombus. Normal compressibility, respiratory phasicity and response to augmentation. Ulnar Veins: No evidence of thrombus. Normal compressibility, respiratory phasicity and response to augmentation. Venous Reflux:  None. Other Findings:  None. IMPRESSION: No evidence of DVT within either upper extremity. Electronically Signed   By: Marijo Conception, M.D.   On: 07/31/2016 15:34    Procedures Procedures (including critical care time)  Medications Ordered in ED Medications - No data to display   Initial Impression / Assessment and Plan / ED Course  I have reviewed the triage vital signs and the nursing notes.  Pertinent labs & imaging results that were  available during my care of the patient were reviewed by me and considered in my medical decision making (see chart for details).     Patient was further by oncologist to rule out DVT to upper extremities. Patient with no evidence of DVT within either upper extremity here in ED. Patient's symptoms likely secondary to arthritis symptoms. Patient denied any other symptoms or any traumas. Patient had good sensation, good strength, 2+ distal pulses, and capillary refill intact. Low suspicion for any other acute process at this time, including cellulitis. Patient is afebrile, hemodynamically stable, in no apparent distress. Patient will be discharged with instructions to take Tylenol and use cool compresses to the areas affected. Patient is to follow-up with primary care provider next week regarding today's visit. Patient is also to follow up with his oncologist regarding today's visit. Patient is agreeable with discharge and assessment and plan. Reasons to immediately return to ED discussed.   Final Clinical Impressions(s) / ED Diagnoses   Final diagnoses:  Pain in both upper extremities    New Prescriptions Discharge Medication List as of 07/31/2016  3:51 PM       Bettey Costa, [place] 07/31/16 1619    Isla Pence, MD 08/01/16 0900

## 2017-02-25 ENCOUNTER — Other Ambulatory Visit: Payer: Self-pay | Admitting: Dermatology

## 2017-02-25 DIAGNOSIS — N6459 Other signs and symptoms in breast: Secondary | ICD-10-CM

## 2017-03-03 ENCOUNTER — Ambulatory Visit
Admission: RE | Admit: 2017-03-03 | Discharge: 2017-03-03 | Disposition: A | Discharge status: Home / Self Care | Payer: BC Managed Care – PPO | Source: Ambulatory Visit | Attending: Dermatology | Admitting: Dermatology

## 2017-03-03 DIAGNOSIS — N6459 Other signs and symptoms in breast: Secondary | ICD-10-CM

## 2017-07-15 ENCOUNTER — Other Ambulatory Visit: Payer: Self-pay

## 2017-07-15 ENCOUNTER — Encounter (HOSPITAL_BASED_OUTPATIENT_CLINIC_OR_DEPARTMENT_OTHER): Payer: Self-pay | Admitting: *Deleted

## 2017-07-15 ENCOUNTER — Emergency Department (HOSPITAL_BASED_OUTPATIENT_CLINIC_OR_DEPARTMENT_OTHER): Payer: BC Managed Care – PPO

## 2017-07-15 ENCOUNTER — Emergency Department (HOSPITAL_BASED_OUTPATIENT_CLINIC_OR_DEPARTMENT_OTHER)
Admission: EM | Admit: 2017-07-15 | Discharge: 2017-07-15 | Disposition: A | Discharge status: Home / Self Care | Payer: BC Managed Care – PPO | Attending: Emergency Medicine | Admitting: Emergency Medicine

## 2017-07-15 DIAGNOSIS — C9000 Multiple myeloma not having achieved remission: Secondary | ICD-10-CM | POA: Diagnosis not present

## 2017-07-15 DIAGNOSIS — Z8547 Personal history of malignant neoplasm of testis: Secondary | ICD-10-CM | POA: Diagnosis not present

## 2017-07-15 DIAGNOSIS — Z7982 Long term (current) use of aspirin: Secondary | ICD-10-CM | POA: Insufficient documentation

## 2017-07-15 DIAGNOSIS — R5081 Fever presenting with conditions classified elsewhere: Secondary | ICD-10-CM | POA: Insufficient documentation

## 2017-07-15 DIAGNOSIS — D701 Agranulocytosis secondary to cancer chemotherapy: Secondary | ICD-10-CM | POA: Insufficient documentation

## 2017-07-15 DIAGNOSIS — R6889 Other general symptoms and signs: Secondary | ICD-10-CM | POA: Diagnosis present

## 2017-07-15 DIAGNOSIS — Z794 Long term (current) use of insulin: Secondary | ICD-10-CM | POA: Diagnosis not present

## 2017-07-15 DIAGNOSIS — J111 Influenza due to unidentified influenza virus with other respiratory manifestations: Secondary | ICD-10-CM | POA: Diagnosis not present

## 2017-07-15 DIAGNOSIS — T451X5A Adverse effect of antineoplastic and immunosuppressive drugs, initial encounter: Secondary | ICD-10-CM

## 2017-07-15 DIAGNOSIS — E119 Type 2 diabetes mellitus without complications: Secondary | ICD-10-CM | POA: Diagnosis not present

## 2017-07-15 DIAGNOSIS — Z79899 Other long term (current) drug therapy: Secondary | ICD-10-CM | POA: Diagnosis not present

## 2017-07-15 LAB — CBC WITH DIFFERENTIAL/PLATELET
BASOS ABS: 0 10*3/uL (ref 0.0–0.1)
BASOS PCT: 1 %
Eosinophils Absolute: 0.1 10*3/uL (ref 0.0–0.7)
Eosinophils Relative: 5 %
HEMATOCRIT: 32.3 % — AB (ref 39.0–52.0)
HEMOGLOBIN: 10.9 g/dL — AB (ref 13.0–17.0)
Lymphocytes Relative: 26 %
Lymphs Abs: 0.7 10*3/uL (ref 0.7–4.0)
MCH: 33.1 pg (ref 26.0–34.0)
MCHC: 33.7 g/dL (ref 30.0–36.0)
MCV: 98.2 fL (ref 78.0–100.0)
Monocytes Absolute: 0.7 10*3/uL (ref 0.1–1.0)
Monocytes Relative: 25 %
NEUTROS ABS: 1.2 10*3/uL — AB (ref 1.7–7.7)
NEUTROS PCT: 43 %
Platelets: 120 10*3/uL — ABNORMAL LOW (ref 150–400)
RBC: 3.29 MIL/uL — ABNORMAL LOW (ref 4.22–5.81)
RDW: 16.7 % — AB (ref 11.5–15.5)
WBC: 2.7 10*3/uL — ABNORMAL LOW (ref 4.0–10.5)

## 2017-07-15 LAB — COMPREHENSIVE METABOLIC PANEL
ALK PHOS: 40 U/L (ref 38–126)
ALT: 37 U/L (ref 17–63)
AST: 38 U/L (ref 15–41)
Albumin: 3.8 g/dL (ref 3.5–5.0)
Anion gap: 8 (ref 5–15)
BILIRUBIN TOTAL: 1.1 mg/dL (ref 0.3–1.2)
BUN: 10 mg/dL (ref 6–20)
CO2: 22 mmol/L (ref 22–32)
Calcium: 8.3 mg/dL — ABNORMAL LOW (ref 8.9–10.3)
Chloride: 106 mmol/L (ref 101–111)
Creatinine, Ser: 1.69 mg/dL — ABNORMAL HIGH (ref 0.61–1.24)
GFR calc Af Amer: 47 mL/min — ABNORMAL LOW (ref >60)
GFR calc non Af Amer: 41 mL/min — ABNORMAL LOW (ref >60)
GLUCOSE: 145 mg/dL — AB (ref 65–99)
POTASSIUM: 3.6 mmol/L (ref 3.5–5.1)
SODIUM: 136 mmol/L (ref 135–145)
TOTAL PROTEIN: 7.2 g/dL (ref 6.5–8.1)

## 2017-07-15 LAB — I-STAT CG4 LACTIC ACID, ED: Lactic Acid, Venous: 0.94 mmol/L (ref 0.5–1.9)

## 2017-07-15 MED ORDER — SODIUM CHLORIDE 0.9 % IV BOLUS
500.0000 mL | Freq: Once | INTRAVENOUS | Status: AC
  Administered 2017-07-15: 500 mL via INTRAVENOUS

## 2017-07-15 MED ORDER — IOPAMIDOL (ISOVUE-300) INJECTION 61%
100.0000 mL | Freq: Once | INTRAVENOUS | Status: AC | PRN
  Administered 2017-07-15: 64 mL via INTRAVENOUS

## 2017-07-15 MED ORDER — LEVOFLOXACIN 750 MG PO TABS: 750.0000 mg | ORAL_TABLET | Freq: Every day | ORAL | 0 refills | Status: AC

## 2017-07-15 MED ORDER — ONDANSETRON HCL 4 MG/2ML IJ SOLN: 4.0000 mg | Freq: Once | INTRAMUSCULAR | Status: DC

## 2017-07-15 MED ORDER — SODIUM CHLORIDE 0.9 % IV SOLN
INTRAVENOUS | Status: DC
  Administered 2017-07-15: 13:00:00 via INTRAVENOUS

## 2017-07-15 MED FILL — levoFLOXacin 750 MG TABS: 750 | 7 days supply | Qty: 7 | Fill #0

## 2017-07-15 NOTE — ED Notes (Signed)
Pt verbalizes understanding of d/c instructions and denies any further needs at this time. 

## 2017-07-15 NOTE — Discharge Instructions (Addendum)
Make appointment to follow-up with the hematology oncology clinic at [facility] to their on-call oncologist today when she does stop your oral chemo regiment.  Take the antibiotic Levaquin for the next 7 days.  Continue all your other meds.  If you develop a fever 100.4 or greater they want to see you immediately.  Also follow-up with them or the [facility] emergency department for any new or worse symptoms.

## 2017-07-15 NOTE — ED Provider Notes (Signed)
[facility] EMERGENCY DEPARTMENT Provider Note   CSN: [id number] Arrival date & time: 07/15/17  0951     History   Chief Complaint Chief Complaint  Patient presents with  . fever, resolved, cancer pt    HPI Tyler Arguijo Jr. is a 67 y.o. male.  Patient spouse with similar illness.  Patient with an upper respiratory kind of infection flulike in nature.  Patient did have his flu shot.  Symptoms been ongoing for 5 days.  Yesterday he had a temp to 101.5.  Has been some vomiting no diarrhea.  He is currently undergoing oral chemotherapy for multiple myeloma.  Dr. Rodriguez is his oncologist at [facility].  Patient was seen by his primary care doctor few days ago and given a Z-Pak.  This was on Tuesday.  Patient has not taken anything to reduce his fever and temp upon arrival here is 99.1.     Past Medical History:  Diagnosis Date  . Bilateral calf pain 07/02/2014  . 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 Cough 07/02/2014  . Diabetes mellitus without complication (HCC)   . GERD (gastroesophageal reflux disease)   . Multiple myeloma (HCC) 02/19/2014  . Multiple myeloma (HCC)     Patient Active Problem List   Diagnosis Date Noted  . Weight gain 03/24/2016  . Esophageal candidiasis (HCC) 03/24/2016  . 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 Physical deconditioning 01/08/2016  . Hypercalcemia 01/08/2016  . History of hypokalemia 01/08/2016  . Leukocytosis 01/07/2016  . Elevated serum creatinine 07/18/2015  . Varicose veins of right lower extremity with pain 07/06/2015  . Drug-induced skin rash 05/30/2015  . Poor dentition 05/30/2015  . Chronic back pain 05/30/2015  . 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 Cough 07/02/2014  . Anemia in chronic illness 05/25/2014  . Weakness generalized 05/25/2014  . 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 Diarrhea 04/21/2014  . Anemia of chronic disease   . Hypotension 03/22/2014  . HLD (hyperlipidemia) 03/09/2014  .  GERD (gastroesophageal reflux disease) 03/09/2014  . Insomnia 03/06/2014  . Bleeding hemorrhoid 03/05/2014  . 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 Coronary artery calcification seen on CAT scan 02/22/2014  . Diabetes mellitus without complication (HCC) 02/22/2014  . 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 Lesion of mandible 02/22/2014  . Multiple myeloma in remission (HCC) 02/19/2014  . Diverticulosis 02/06/2014  . Lytic bone lesions on xray 02/06/2014    Past Surgical History:  Procedure Laterality Date  . ORIF ULNAR FRACTURE    . sternum biopsy     for dx of multiple myeloma  . TESTICLE REMOVAL Right 2004        Home Medications    Prior to Admission medications   Medication Sig Start Date End Date Taking? Authorizing Provider  acyclovir (ZOVIRAX) 800 MG tablet TK 1 T PO BID 04/03/15   [provider]  aspirin EC 81 MG tablet Take by mouth.    [provider]  benzonatate (TESSALON PERLES) 100 MG capsule Take 1 capsule (100 mg total) by mouth 3 (three) times daily as needed for cough. Patient not taking: Reported on 06/23/2016 03/24/16   Mackuen, Courteney Lyn, MD  calcium carbonate (CALCIUM 600) 600 MG TABS tablet Take by mouth.    [provider]  Cholecalciferol (VITAMIN D3) 2000 UNITS TABS Take 1 tablet by mouth daily.     [provider]  dexamethasone (DECADRON) 4 MG tablet Take 40 mg by mouth once a week. Mondays      [provider]  folic acid (FOLVITE) 1 MG tablet TAKE 1 TABLET BY MOUTH EVERY DAY 10/04/14   Gorsuch, Ni, MD  guaiFENesin-codeine 100-10 MG/5ML syrup Take 5 mLs by mouth every 6 (six) hours as needed for cough. Patient not taking: Reported on 06/23/2016 03/24/16   Mackuen, Courteney Lyn, MD  HYDROcodone-homatropine (HYCODAN) 5-1.5 MG/5ML syrup Take 5 mLs by mouth every 6 (six) hours as needed for cough. Patient not taking: Reported on 03/24/2016 11/21/15   Gorsuch, Ni, MD  insulin glargine (LANTUS) 100 UNIT/ML  injection Inject 5-6 Units into the skin at bedtime. Reported on 07/04/2015    [provider]  ixazomib citrate (NINLARO) 4 MG capsule Take 4 mg by mouth once a week. Take on an empty stomach 1hr before or 2hrs after food. Do not crush, chew or open.  Once a week on Mondays x 3 weeks on and 1 week off    [provider]  levofloxacin (LEVAQUIN) 750 MG tablet Take 1 tablet (750 mg total) by mouth daily. 07/15/17   Zackowski, Scott, MD  levothyroxine (SYNTHROID, LEVOTHROID) 25 MCG tablet  01/01/16   [provider]  metFORMIN (GLUCOPHAGE) 500 MG tablet Take 500 mg by mouth 2 (two) times daily with a meal. Reported on 07/04/2015    [provider]  Multiple Vitamin (MULTIVITAMIN) tablet Take 1 tablet by mouth daily.    [provider]  pantoprazole (PROTONIX) 40 MG tablet Take 40 mg by mouth. 01/16/15   [provider]  potassium chloride (MICRO-K) 10 MEQ CR capsule Take 10 mEq by mouth 2 (two) times daily.  01/27/15   [provider]  pravastatin (PRAVACHOL) 40 MG tablet Reported on 07/04/2015 03/23/15   [provider]  prochlorperazine (COMPAZINE) 10 MG tablet Take 10 mg by mouth. Reported on 07/04/2015 01/04/15   [provider]  rosuvastatin (CRESTOR) 5 MG tablet  01/03/16   [provider]    Family History Family History  Problem Relation Age of Onset  . Diabetes Father   . Cancer Maternal Uncle        lung cancer  . Cancer Cousin        myeloma    Social History Social History   Tobacco Use  . Smoking status: Former Smoker    Types: Pipe    Last attempt to quit: 05/24/1985    Years since quitting: 32.1  . Smokeless tobacco: Never Used  Substance Use Topics  . Alcohol use: No    Alcohol/week: 0.0 oz  . Drug use: No     Allergies   Velcade [bortezomib]   Review of Systems Review of Systems  Constitutional: Positive for fever.  HENT: Positive for congestion. Negative for sore throat.     Eyes: Negative for redness.  Respiratory: Positive for cough.   Cardiovascular: Negative for chest pain.  Gastrointestinal: Positive for nausea and vomiting. Negative for abdominal pain and diarrhea.  Genitourinary: Negative for dysuria.  Musculoskeletal: Positive for myalgias.  Skin: Negative for rash.  Allergic/Immunologic: Positive for immunocompromised state.  Neurological: Negative for headaches.  Hematological: Does not bruise/bleed easily.     Physical Exam Updated Vital Signs BP 126/80   Pulse 73   Temp 99 F (37.2 C) (Oral)   Resp (!) 23   SpO2 93%   Physical Exam  Constitutional: He is oriented to person, place, and time. He appears well-developed and well-nourished. No distress.  HENT:  Head: Normocephalic and atraumatic.  Mouth/Throat: Oropharynx is clear and moist.    Eyes: Pupils are equal, round, and reactive to light. Conjunctivae and EOM are normal.  Neck: Normal range of motion. Neck supple.  Cardiovascular: Normal rate, regular rhythm and normal heart sounds.  Pulmonary/Chest: Effort normal and breath sounds normal. No respiratory distress. He has no wheezes. He has no rales.  Abdominal: Soft. Bowel sounds are normal. There is no tenderness.  Musculoskeletal: Normal range of motion.  Neurological: He is alert and oriented to person, place, and time. No cranial nerve deficit or sensory deficit. He exhibits normal muscle tone. Coordination normal.  Skin: Skin is warm. No rash noted.  Nursing note and vitals reviewed.    ED Treatments / Results  Labs (all labs ordered are listed, but only abnormal results are displayed) Labs Reviewed  CBC WITH DIFFERENTIAL/PLATELET - Abnormal; Notable for the following components:      Result Value   WBC 2.7 (*)    RBC 3.29 (*)    Hemoglobin 10.9 (*)    HCT 32.3 (*)    RDW 16.7 (*)    Platelets 120 (*)    Neutro Abs 1.2 (*)    All other components within normal limits  COMPREHENSIVE METABOLIC PANEL - Abnormal;  Notable for the following components:   Glucose, Bld 145 (*)    Creatinine, Ser 1.69 (*)    Calcium 8.3 (*)    GFR calc non Af Amer 41 (*)    GFR calc Af Amer 47 (*)    All other components within normal limits  CULTURE, BLOOD (ROUTINE X 2)  CULTURE, BLOOD (ROUTINE X 2)  I-STAT CG4 LACTIC ACID, ED    EKG None  Radiology Dg Chest 2 View  Result Date: 07/15/2017 CLINICAL DATA:  Cough, fever EXAM: CHEST - 2 VIEW COMPARISON:  03/24/2016 FINDINGS: Patchy bibasilar opacities likely reflect atelectasis. Heart is normal size. No effusions or acute bony abnormality. IMPRESSION: Bibasilar densities, favor atelectasis. Electronically Signed   By: Kevin  Dover M.D.   On: 07/15/2017 11:20   Ct Chest W Contrast  Result Date: 07/15/2017 CLINICAL DATA:  Productive cough for the past week. New onset fever this morning. History of multiple myeloma. EXAM: CT CHEST WITH CONTRAST TECHNIQUE: Multidetector CT imaging of the chest was performed during intravenous contrast administration. CONTRAST:  64mL ISOVUE-300 IOPAMIDOL (ISOVUE-300) INJECTION 61% COMPARISON:  CT chest dated July 06, 2014. FINDINGS: Cardiovascular: No significant vascular findings. Normal heart size. No pericardial effusion. Normal caliber thoracic aorta. Coronary artery atherosclerosis. Mediastinum/Nodes: No axillary, mediastinal, or hilar lymphadenopathy. Unchanged 3.4 cm rim calcified right thyroid nodule. Unremarkable trachea and esophagus. Small hiatal hernia. Lungs/Pleura: Left greater than right bibasilar atelectasis. New bronchiectasis and scarring in the right lower lobe, likely postinfectious or postinflammatory. No focal consolidation, pleural effusion, or pneumothorax. Stable 5 mm pulmonary nodules in the right middle lobe and right lower lobe, benign. No suspicious pulmonary nodule. Upper Abdomen: No acute abnormality. Diffuse hepatic steatosis. Partially visualized 3.1 cm right renal cyst. Musculoskeletal: Bilateral gynecomastia.  Innumerable lytic lesions throughout the bony thorax, overall similar to prior study. Multiple bilateral healed rib fractures. Healed fractures of the left proximal clavicle and sternal body. No acute fracture. IMPRESSION: 1.  No acute intrathoracic process. 2. Innumerable lytic lesions throughout the bony thorax, overall similar to prior study, consistent with history of multiple myeloma. 3. Hepatic steatosis. Electronically Signed   By: William T Derry M.D.   On: 07/15/2017 13:33    Procedures Procedures (including critical care time)  Medications Ordered in ED Medications  0.9 %    sodium chloride infusion ( Intravenous New Bag/Given 07/15/17 1259)  ondansetron (ZOFRAN) injection 4 mg (4 mg Intravenous Refused 07/15/17 1120)  sodium chloride 0.9 % bolus 500 mL (0 mLs Intravenous Stopped 07/15/17 1230)  iopamidol (ISOVUE-300) 61 % injection 100 mL (64 mLs Intravenous Contrast Given 07/15/17 1308)     Initial Impression / Assessment and Plan / ED Course  I have reviewed the triage vital signs and the nursing notes.  Pertinent labs & imaging results that were available during my care of the patient were reviewed by me and considered in my medical decision making (see chart for details).     Patient currently undergoing oral chemotherapy via the hematology oncology clinic at [facility] for multiple myeloma.  Patient developed a viral flulike illness his wife had something similar with cough and congestion has had some fevers with it.  Temp here was 99.1 he did not take anything to lower the fever but they got a temp at home at 101.5.  That fever was yesterday.  Patient had a cough for a week he was started on a Z-Pak on Tuesday.  By primary care doctor.  Did have his flu shot this year.  I did have some vomiting.  No diarrhea.  Patient's oncology doctor is Dr. Debbrah Alar at [facility].  Patient's white blood cell count on March 25 was 4.2.  Today it was 2.7 and his absolute neutrophil count was 1.2.  Chest  x-ray and CT chest negative for pneumonia patient's lactic acid was not elevated.  Patient's vital signs here without evidence of sepsis.  Blood cultures were done and are pending.  Electrolytes without significant abnormalities.  Discussed with on-call hematology oncology at [facility] they recommended holding his oral chemo putting him on Levaquin for 7 days.  Have him follow-up with them by calling to make an appointment.  If he develops a fever needs to get seen immediately there.  Also needs to get seen immediately there for any new or worse symptoms.  If patient's blood cultures are positive he needs to be seen immediately about this.  Final Clinical Impressions(s) / ED Diagnoses   Final diagnoses:  Flu-like symptoms  Chemotherapy-induced neutropenia [facility])    ED Discharge Orders        Ordered    levofloxacin (LEVAQUIN) 750 MG tablet  Daily     07/15/17 1555       Fredia Sorrow, MD 07/15/17 [phone]

## 2017-07-15 NOTE — ED Triage Notes (Signed)
Pt reports increased temp this am to 101.5, last chemo 2 weeks ago, pt states he has had cough x 1 week, and started a z pack on Tuesday. Temp now is 99.1 oral.

## 2017-07-20 LAB — CULTURE, BLOOD (ROUTINE X 2)
CULTURE: NO GROWTH
CULTURE: NO GROWTH
SPECIAL REQUESTS: ADEQUATE
Special Requests: ADEQUATE

## 2018-04-22 DIAGNOSIS — Z87891 Personal history of nicotine dependence: Secondary | ICD-10-CM | POA: Diagnosis not present

## 2018-04-22 DIAGNOSIS — Z8547 Personal history of malignant neoplasm of testis: Secondary | ICD-10-CM | POA: Insufficient documentation

## 2018-04-22 DIAGNOSIS — Z79899 Other long term (current) drug therapy: Secondary | ICD-10-CM | POA: Diagnosis not present

## 2018-04-22 DIAGNOSIS — Z7982 Long term (current) use of aspirin: Secondary | ICD-10-CM | POA: Insufficient documentation

## 2018-04-22 DIAGNOSIS — Z7902 Long term (current) use of antithrombotics/antiplatelets: Secondary | ICD-10-CM | POA: Diagnosis not present

## 2018-04-22 DIAGNOSIS — Z794 Long term (current) use of insulin: Secondary | ICD-10-CM | POA: Insufficient documentation

## 2018-04-22 DIAGNOSIS — R509 Fever, unspecified: Secondary | ICD-10-CM | POA: Insufficient documentation

## 2018-04-22 DIAGNOSIS — C9000 Multiple myeloma not having achieved remission: Secondary | ICD-10-CM | POA: Insufficient documentation

## 2018-04-22 DIAGNOSIS — E119 Type 2 diabetes mellitus without complications: Secondary | ICD-10-CM | POA: Insufficient documentation

## 2018-04-23 ENCOUNTER — Encounter (HOSPITAL_BASED_OUTPATIENT_CLINIC_OR_DEPARTMENT_OTHER): Payer: Self-pay | Admitting: Emergency Medicine

## 2018-04-23 ENCOUNTER — Other Ambulatory Visit: Payer: Self-pay

## 2018-04-23 ENCOUNTER — Emergency Department (HOSPITAL_BASED_OUTPATIENT_CLINIC_OR_DEPARTMENT_OTHER)
Admission: EM | Admit: 2018-04-23 | Discharge: 2018-04-23 | Disposition: A | Discharge status: Home / Self Care | Payer: BC Managed Care – PPO | Attending: Emergency Medicine | Admitting: Emergency Medicine

## 2018-04-23 ENCOUNTER — Emergency Department (HOSPITAL_BASED_OUTPATIENT_CLINIC_OR_DEPARTMENT_OTHER): Payer: BC Managed Care – PPO

## 2018-04-23 DIAGNOSIS — R509 Fever, unspecified: Secondary | ICD-10-CM

## 2018-04-23 DIAGNOSIS — C9000 Multiple myeloma not having achieved remission: Secondary | ICD-10-CM

## 2018-04-23 LAB — COMPREHENSIVE METABOLIC PANEL
ALBUMIN: 3.6 g/dL (ref 3.5–5.0)
ALT: 31 U/L (ref 0–44)
AST: 28 U/L (ref 15–41)
Alkaline Phosphatase: 58 U/L (ref 38–126)
Anion gap: 8 (ref 5–15)
BUN: 16 mg/dL (ref 8–23)
CHLORIDE: 101 mmol/L (ref 98–111)
CO2: 22 mmol/L (ref 22–32)
CREATININE: 1.72 mg/dL — AB (ref 0.61–1.24)
Calcium: 8.8 mg/dL — ABNORMAL LOW (ref 8.9–10.3)
GFR calc Af Amer: 47 mL/min — ABNORMAL LOW (ref >60)
GFR calc non Af Amer: 41 mL/min — ABNORMAL LOW (ref >60)
Glucose, Bld: 203 mg/dL — ABNORMAL HIGH (ref 70–99)
POTASSIUM: 3.2 mmol/L — AB (ref 3.5–5.1)
SODIUM: 131 mmol/L — AB (ref 135–145)
Total Bilirubin: 0.9 mg/dL (ref 0.3–1.2)
Total Protein: 6.3 g/dL — ABNORMAL LOW (ref 6.5–8.1)

## 2018-04-23 LAB — CBC WITH DIFFERENTIAL/PLATELET
ABS IMMATURE GRANULOCYTES: 0.03 10*3/uL (ref 0.00–0.07)
BASOS ABS: 0 10*3/uL (ref 0.0–0.1)
BASOS PCT: 0 %
EOS ABS: 0.1 10*3/uL (ref 0.0–0.5)
Eosinophils Relative: 1 %
HCT: 31.5 % — ABNORMAL LOW (ref 39.0–52.0)
Hemoglobin: 10 g/dL — ABNORMAL LOW (ref 13.0–17.0)
IMMATURE GRANULOCYTES: 1 %
Lymphocytes Relative: 16 %
Lymphs Abs: 1 10*3/uL (ref 0.7–4.0)
MCH: 31.1 pg (ref 26.0–34.0)
MCHC: 31.7 g/dL (ref 30.0–36.0)
MCV: 97.8 fL (ref 80.0–100.0)
Monocytes Absolute: 0.7 10*3/uL (ref 0.1–1.0)
Monocytes Relative: 12 %
NEUTROS ABS: 4.1 10*3/uL (ref 1.7–7.7)
NEUTROS PCT: 70 %
NRBC: 0 % (ref 0.0–0.2)
PLATELETS: 164 10*3/uL (ref 150–400)
RBC: 3.22 MIL/uL — AB (ref 4.22–5.81)
RDW: 16.7 % — ABNORMAL HIGH (ref 11.5–15.5)
WBC: 5.9 10*3/uL (ref 4.0–10.5)

## 2018-04-23 LAB — URINALYSIS, ROUTINE W REFLEX MICROSCOPIC
BILIRUBIN URINE: NEGATIVE
Glucose, UA: >=500 mg/dL — AB
Ketones, ur: NEGATIVE mg/dL
Leukocytes, UA: NEGATIVE
Nitrite: NEGATIVE
Protein, ur: NEGATIVE mg/dL
Specific Gravity, Urine: <1.005 — ABNORMAL LOW (ref 1.005–1.030)
pH: 5.5 (ref 5.0–8.0)

## 2018-04-23 LAB — URINALYSIS, MICROSCOPIC (REFLEX): Bacteria, UA: NONE SEEN

## 2018-04-23 LAB — INFLUENZA PANEL BY PCR (TYPE A & B)
Influenza A By PCR: NEGATIVE
Influenza B By PCR: NEGATIVE

## 2018-04-23 LAB — LACTIC ACID, PLASMA: Lactic Acid, Venous: 1.9 mmol/L (ref 0.5–1.9)

## 2018-04-23 MED ORDER — OSELTAMIVIR PHOSPHATE 75 MG PO CAPS: 75.0000 mg | ORAL_CAPSULE | Freq: Two times a day (BID) | ORAL | 0 refills | Status: AC

## 2018-04-23 NOTE — ED Notes (Addendum)
Pt spiked a fever tonight which is normal for him, but he and his spouse state that his fever is usually not up to 102.9 like it was. Pt denies any complaints, no cough, no pain, is currently receiving chemo and radiation with the last treatment being last week. He has a port, the area is not red, not tender, last accessed last week. He took 1000mg  tylenol at 2200

## 2018-04-23 NOTE — ED Triage Notes (Signed)
Pt states he is a cancer pt and tonight he had a temp 102.9 pta took some Tylenol, temp 100.3 on triage.

## 2018-04-23 NOTE — ED Notes (Signed)
RN to call

## 2018-04-23 NOTE — ED Provider Notes (Signed)
[facility] EMERGENCY DEPARTMENT Provider Note   CSN: [id number] Arrival date & time: 04/22/18  2356     History   Chief Complaint Chief Complaint  Patient presents with  . Fever    HPI Tyler Willis. is a 68 y.o. male.  Patient presents to the emergency department for evaluation of fever.  Patient currently being treated at [facility] for multiple myeloma.  He tells me that over the last 3 months or so he has been getting low-grade fevers generally every night.  He takes Tylenol and it generally goes away.  He has had a neutropenic fever in the past with his treatments.  He is currently only receiving Darzalex infusions for his treatment.  He is going to start Pomalyst this coming week, however.  Tonight he developed a fever above 101 which is unusual.  Also did not come down with Tylenol.  This is the reason he presented to the ER.  This has been a change in his pattern.  He has not had any flu symptoms, cough, chest congestion, vomiting, diarrhea, neck pain, rash.     Past Medical History:  Diagnosis Date  . Bilateral calf pain 07/02/2014  . Cancer [facility]) 2004   testicular  . Cough 07/02/2014  . Diabetes mellitus without complication ([place])   . GERD (gastroesophageal reflux disease)   . Multiple myeloma ([place]) 02/19/2014  . Multiple myeloma [facility])     Patient Active Problem List   Diagnosis Date Noted  . Weight gain 03/24/2016  . Esophageal candidiasis ([place]) 03/24/2016  . Steroid-induced diabetes ([place]) 01/08/2016  . Physical deconditioning 01/08/2016  . Hypercalcemia 01/08/2016  . History of hypokalemia 01/08/2016  . Leukocytosis 01/07/2016  . Elevated serum creatinine 07/18/2015  . Varicose veins of right lower extremity with pain 07/06/2015  . Drug-induced skin rash 05/30/2015  . Poor dentition 05/30/2015  . Chronic back pain 05/30/2015  . S/P bone marrow transplant ([place]) 02/04/2015  . Cough 07/02/2014  . Anemia in chronic illness 05/25/2014  . Weakness  generalized 05/25/2014  . Hypogammaglobulinemia ([place]) 05/17/2014  . Diarrhea 04/21/2014  . Anemia of chronic disease   . Hypotension 03/22/2014  . HLD (hyperlipidemia) 03/09/2014  . GERD (gastroesophageal reflux disease) 03/09/2014  . Insomnia 03/06/2014  . Bleeding hemorrhoid 03/05/2014  . Secondary amyloidosis ([place]) 02/22/2014  . Coronary artery calcification seen on CAT scan 02/22/2014  . Diabetes mellitus without complication ([place]) 78/24/2353  . Seminoma of right testis ([place]) 02/22/2014  . Lesion of mandible 02/22/2014  . Multiple myeloma in remission ([place]) 02/19/2014  . Diverticulosis 02/06/2014  . Lytic bone lesions on xray 02/06/2014    Past Surgical History:  Procedure Laterality Date  . ORIF ULNAR FRACTURE    . sternum biopsy     for dx of multiple myeloma  . TESTICLE REMOVAL Right 2004        Home Medications    Prior to Admission medications   Medication Sig Start Date End Date Taking? Authorizing Provider  acyclovir (ZOVIRAX) 800 MG tablet TK 1 T PO BID 04/03/15   [provider]  aspirin EC 81 MG tablet Take by mouth.    [provider]  calcium carbonate (CALCIUM 600) 600 MG TABS tablet Take by mouth.    [provider]  Cholecalciferol (VITAMIN D3) 2000 UNITS TABS Take 1 tablet by mouth daily.     [provider]  dexamethasone (DECADRON) 4 MG tablet Take 40 mg by mouth once a week. Mondays  [provider]  folic acid (FOLVITE) 1 MG tablet TAKE 1 TABLET BY MOUTH EVERY DAY 10/04/14   Heath Lark, MD  insulin glargine (LANTUS) 100 UNIT/ML injection Inject 5-6 Units into the skin at bedtime. Reported on 07/04/2015    [provider]  ixazomib citrate (NINLARO) 4 MG capsule Take 4 mg by mouth once a week. Take on an empty stomach 1hr before or 2hrs after food. Do not crush, chew or open.  Once a week on Mondays x 3 weeks on and 1 week off    [provider]  levofloxacin (LEVAQUIN) 750 MG tablet Take 1  tablet (750 mg total) by mouth daily. 07/15/17   Fredia Sorrow, MD  levothyroxine (SYNTHROID, LEVOTHROID) 25 MCG tablet  01/01/16   [provider]  metFORMIN (GLUCOPHAGE) 500 MG tablet Take 500 mg by mouth 2 (two) times daily with a meal. Reported on 07/04/2015    [provider]  Multiple Vitamin (MULTIVITAMIN) tablet Take 1 tablet by mouth daily.    [provider]  oseltamivir (TAMIFLU) 75 MG capsule Take 1 capsule (75 mg total) by mouth every 12 (twelve) hours. 04/23/18   Orpah Greek, MD  pantoprazole (PROTONIX) 40 MG tablet Take 40 mg by mouth. 01/16/15   [provider]  potassium chloride (MICRO-K) 10 MEQ CR capsule Take 10 mEq by mouth 2 (two) times daily.  01/27/15   [provider]  pravastatin (PRAVACHOL) 40 MG tablet Reported on 07/04/2015 03/23/15   [provider]  prochlorperazine (COMPAZINE) 10 MG tablet Take 10 mg by mouth. Reported on 07/04/2015 01/04/15   [provider]  rosuvastatin (CRESTOR) 5 MG tablet  01/03/16   [provider]    Family History Family History  Problem Relation Age of Onset  . Diabetes Father   . Cancer Maternal Uncle        lung cancer  . Cancer Cousin        myeloma    Social History Social History   Tobacco Use  . Smoking status: Former Smoker    Types: Pipe    Last attempt to quit: 05/24/1985    Years since quitting: 32.9  . Smokeless tobacco: Never Used  Substance Use Topics  . Alcohol use: No    Alcohol/week: 0.0 standard drinks  . Drug use: No     Allergies   Velcade [bortezomib]   Review of Systems Review of Systems  Constitutional: Positive for fever.  All other systems reviewed and are negative.    Physical Exam Updated Vital Signs BP 101/62 (BP Location: Right Arm)   Pulse 80   Temp 98.4 F (36.9 C) (Oral)   Resp 20   Ht 5' 10"  (1.778 m)   Wt 81.6 kg   SpO2 99%   BMI 25.83 kg/m   Physical Exam Vitals signs and nursing note  reviewed.  Constitutional:      General: He is not in acute distress.    Appearance: Normal appearance. He is well-developed.  HENT:     Head: Normocephalic and atraumatic.     Right Ear: Hearing normal.     Left Ear: Hearing normal.     Nose: Nose normal.  Eyes:     Conjunctiva/sclera: Conjunctivae normal.     Pupils: Pupils are equal, round, and reactive to light.  Neck:     Musculoskeletal: Normal range of motion and neck supple.  Cardiovascular:     Rate and Rhythm: Regular rhythm.  Heart sounds: S1 normal and S2 normal. No murmur. No friction rub. No gallop.   Pulmonary:     Effort: Pulmonary effort is normal. No respiratory distress.     Breath sounds: Normal breath sounds.  Chest:     Chest wall: No tenderness.  Abdominal:     General: Bowel sounds are normal.     Palpations: Abdomen is soft.     Tenderness: There is no abdominal tenderness. There is no guarding or rebound. Negative signs include Murphy's sign and McBurney's sign.     Hernia: No hernia is present.  Musculoskeletal: Normal range of motion.  Skin:    General: Skin is warm and dry.     Findings: No rash.  Neurological:     Mental Status: He is alert and oriented to person, place, and time.     GCS: GCS eye subscore is 4. GCS verbal subscore is 5. GCS motor subscore is 6.     Cranial Nerves: No cranial nerve deficit.     Sensory: No sensory deficit.     Coordination: Coordination normal.  Psychiatric:        Speech: Speech normal.        Behavior: Behavior normal.        Thought Content: Thought content normal.      ED Treatments / Results  Labs (all labs ordered are listed, but only abnormal results are displayed) Labs Reviewed  CBC WITH DIFFERENTIAL/PLATELET - Abnormal; Notable for the following components:      Result Value   RBC 3.22 (*)    Hemoglobin 10.0 (*)    HCT 31.5 (*)    RDW 16.7 (*)    All other components within normal limits  COMPREHENSIVE METABOLIC PANEL - Abnormal;  Notable for the following components:   Sodium 131 (*)    Potassium 3.2 (*)    Glucose, Bld 203 (*)    Creatinine, Ser 1.72 (*)    Calcium 8.8 (*)    Total Protein 6.3 (*)    GFR calc non Af Amer 41 (*)    GFR calc Af Amer 47 (*)    All other components within normal limits  URINALYSIS, ROUTINE W REFLEX MICROSCOPIC - Abnormal; Notable for the following components:   Specific Gravity, Urine <1.005 (*)    Glucose, UA >=500 (*)    Hgb urine dipstick TRACE (*)    All other components within normal limits  CULTURE, BLOOD (ROUTINE X 2)  CULTURE, BLOOD (ROUTINE X 2)  URINE CULTURE  LACTIC ACID, PLASMA  URINALYSIS, MICROSCOPIC (REFLEX)  INFLUENZA PANEL BY PCR (TYPE A & B)    EKG None  Radiology Dg Chest 2 View  Result Date: 04/23/2018 CLINICAL DATA:  Fever to 102 tonight. EXAM: CHEST - 2 VIEW COMPARISON:  02/02/2018 FINDINGS: Normal heart size and mediastinal contours. Aortic atherosclerosis is noted of the arch without aneurysm. Right IJ port catheter is noted with tip in the proximal SVC. No acute pulmonary consolidation, effusion or overt pulmonary edema. No pneumothorax. Bilateral lower rib fractures are seen without acute nor aggressive osseous lesions. Osteopenic appearance of the dorsal spine without acute osseous appearing abnormality. IMPRESSION: No active cardiopulmonary disease. Electronically Signed   By: Ashley Royalty M.D.   On: 04/23/2018 02:05    Procedures Procedures (including critical care time)  Medications Ordered in ED Medications - No data to display   Initial Impression / Assessment and Plan / ED Course  I have reviewed the triage vital signs and  the nursing notes.  Pertinent labs & imaging results that were available during my care of the patient were reviewed by me and considered in my medical decision making (see chart for details).     Patient appears well and nontoxic.  His fever has resolved.  He had a slight tachycardia presentation that resolved with  the fever.  He is not experiencing any chest pain, shortness of breath or pleuritic pain.  I do not suspect PE.  He has been running fevers daily which is likely secondary to his myeloma.  He does not have any sign of bacterial infection at this time.  Urinalysis is clear.  Chest x-ray is clear.  White blood cell count is normal.  He is not neutropenic and he does not have a leukocytosis.  Blood and urine cultures are pending.  Flu swab will not be resulted until later today.  Discussed with Dr. Harvie Heck, on-call for oncology at [facility] where he receives his care.  Patient's examination, history and presentation as well as labs were discussed.  She does not recommend antibiotic treatment.  She recommends oral hydration, Tylenol for the weekend and follow-up with Dr. Norma Fredrickson Monday.  Patient has an appointment on Monday already.  We will send prescription for Tamiflu to patient's pharmacy.  He will fill it if he develops any flulike symptoms, otherwise no specific treatment necessary.  He was given return precautions.  Final Clinical Impressions(s) / ED Diagnoses   Final diagnoses:  Fever, unspecified fever cause  Multiple myeloma not having achieved remission [facility])    ED Discharge Orders         Ordered    oseltamivir (TAMIFLU) 75 MG capsule  Every 12 hours     04/23/18 0339           Orpah Greek, MD 04/23/18 [phone]

## 2018-04-23 NOTE — ED Notes (Signed)
Contacted [facility] line for Oncology @ [phone] will have oncologist call back

## 2018-04-24 LAB — URINE CULTURE: Culture: NO GROWTH

## 2018-04-28 LAB — CULTURE, BLOOD (ROUTINE X 2)
Culture: NO GROWTH
Culture: NO GROWTH
SPECIAL REQUESTS: ADEQUATE
Special Requests: ADEQUATE

## 2018-11-10 ENCOUNTER — Emergency Department (HOSPITAL_BASED_OUTPATIENT_CLINIC_OR_DEPARTMENT_OTHER): Payer: BC Managed Care – PPO

## 2018-11-10 ENCOUNTER — Emergency Department (HOSPITAL_BASED_OUTPATIENT_CLINIC_OR_DEPARTMENT_OTHER)
Admission: EM | Admit: 2018-11-10 | Discharge: 2018-11-10 | Disposition: A | Discharge status: Other Acute Inpatient Hospital | Payer: BC Managed Care – PPO | Attending: Emergency Medicine | Admitting: Emergency Medicine

## 2018-11-10 ENCOUNTER — Encounter (HOSPITAL_BASED_OUTPATIENT_CLINIC_OR_DEPARTMENT_OTHER): Payer: Self-pay | Admitting: Emergency Medicine

## 2018-11-10 DIAGNOSIS — A419 Sepsis, unspecified organism: Secondary | ICD-10-CM | POA: Insufficient documentation

## 2018-11-10 DIAGNOSIS — C9000 Multiple myeloma not having achieved remission: Secondary | ICD-10-CM | POA: Insufficient documentation

## 2018-11-10 DIAGNOSIS — Z794 Long term (current) use of insulin: Secondary | ICD-10-CM | POA: Diagnosis not present

## 2018-11-10 DIAGNOSIS — N179 Acute kidney failure, unspecified: Secondary | ICD-10-CM | POA: Insufficient documentation

## 2018-11-10 DIAGNOSIS — R531 Weakness: Secondary | ICD-10-CM | POA: Diagnosis not present

## 2018-11-10 DIAGNOSIS — E119 Type 2 diabetes mellitus without complications: Secondary | ICD-10-CM | POA: Diagnosis not present

## 2018-11-10 DIAGNOSIS — R6520 Severe sepsis without septic shock: Secondary | ICD-10-CM

## 2018-11-10 DIAGNOSIS — R509 Fever, unspecified: Secondary | ICD-10-CM

## 2018-11-10 DIAGNOSIS — Z20828 Contact with and (suspected) exposure to other viral communicable diseases: Secondary | ICD-10-CM | POA: Insufficient documentation

## 2018-11-10 DIAGNOSIS — Z7982 Long term (current) use of aspirin: Secondary | ICD-10-CM | POA: Insufficient documentation

## 2018-11-10 DIAGNOSIS — Z87891 Personal history of nicotine dependence: Secondary | ICD-10-CM | POA: Diagnosis not present

## 2018-11-10 DIAGNOSIS — Z79899 Other long term (current) drug therapy: Secondary | ICD-10-CM | POA: Insufficient documentation

## 2018-11-10 LAB — COMPREHENSIVE METABOLIC PANEL
ALT: 10 U/L (ref 0–44)
AST: 14 U/L — ABNORMAL LOW (ref 15–41)
Albumin: 3.5 g/dL (ref 3.5–5.0)
Alkaline Phosphatase: 34 U/L — ABNORMAL LOW (ref 38–126)
Anion gap: 11 (ref 5–15)
BUN: 27 mg/dL — ABNORMAL HIGH (ref 8–23)
CO2: 20 mmol/L — ABNORMAL LOW (ref 22–32)
Calcium: 8.8 mg/dL — ABNORMAL LOW (ref 8.9–10.3)
Chloride: 104 mmol/L (ref 98–111)
Creatinine, Ser: 2.21 mg/dL — ABNORMAL HIGH (ref 0.61–1.24)
GFR calc Af Amer: 35 mL/min — ABNORMAL LOW (ref >60)
GFR calc non Af Amer: 30 mL/min — ABNORMAL LOW (ref >60)
Glucose, Bld: 168 mg/dL — ABNORMAL HIGH (ref 70–99)
Potassium: 3.7 mmol/L (ref 3.5–5.1)
Sodium: 135 mmol/L (ref 135–145)
Total Bilirubin: 0.4 mg/dL (ref 0.3–1.2)
Total Protein: 6.2 g/dL — ABNORMAL LOW (ref 6.5–8.1)

## 2018-11-10 LAB — CBC WITH DIFFERENTIAL/PLATELET
Abs Immature Granulocytes: 0.11 10*3/uL — ABNORMAL HIGH (ref 0.00–0.07)
Basophils Absolute: 0 10*3/uL (ref 0.0–0.1)
Basophils Relative: 0 %
Eosinophils Absolute: 0.2 10*3/uL (ref 0.0–0.5)
Eosinophils Relative: 2 %
HCT: 27.8 % — ABNORMAL LOW (ref 39.0–52.0)
Hemoglobin: 8.9 g/dL — ABNORMAL LOW (ref 13.0–17.0)
Immature Granulocytes: 1 %
Lymphocytes Relative: 11 %
Lymphs Abs: 1.1 10*3/uL (ref 0.7–4.0)
MCH: 33 pg (ref 26.0–34.0)
MCHC: 32 g/dL (ref 30.0–36.0)
MCV: 103 fL — ABNORMAL HIGH (ref 80.0–100.0)
Monocytes Absolute: 2 10*3/uL — ABNORMAL HIGH (ref 0.1–1.0)
Monocytes Relative: 20 %
Neutro Abs: 6.4 10*3/uL (ref 1.7–7.7)
Neutrophils Relative %: 66 %
Platelets: 296 10*3/uL (ref 150–400)
RBC: 2.7 MIL/uL — ABNORMAL LOW (ref 4.22–5.81)
RDW: 16.2 % — ABNORMAL HIGH (ref 11.5–15.5)
WBC: 9.8 10*3/uL (ref 4.0–10.5)
nRBC: 0 % (ref 0.0–0.2)

## 2018-11-10 LAB — SARS CORONAVIRUS 2 BY RT PCR (HOSPITAL ORDER, PERFORMED IN ~~LOC~~ HOSPITAL LAB): SARS Coronavirus 2: NEGATIVE

## 2018-11-10 LAB — URINALYSIS, ROUTINE W REFLEX MICROSCOPIC
Bilirubin Urine: NEGATIVE
Glucose, UA: 100 mg/dL — AB
Hgb urine dipstick: NEGATIVE
Ketones, ur: NEGATIVE mg/dL
Leukocytes,Ua: NEGATIVE
Nitrite: NEGATIVE
Protein, ur: NEGATIVE mg/dL
Specific Gravity, Urine: 1.015 (ref 1.005–1.030)
pH: 6 (ref 5.0–8.0)

## 2018-11-10 LAB — PROTIME-INR
INR: 1.1 (ref 0.8–1.2)
Prothrombin Time: 13.8 seconds (ref 11.4–15.2)

## 2018-11-10 LAB — LACTIC ACID, PLASMA: Lactic Acid, Venous: 2 mmol/L (ref 0.5–1.9)

## 2018-11-10 NOTE — ED Notes (Signed)
Pt IV bolus complete  EDP notified

## 2018-11-10 NOTE — ED Notes (Signed)
Pt arrived during downtime see paper charting

## 2018-11-10 NOTE — ED Notes (Signed)
ED Provider at bedside. 

## 2018-11-10 NOTE — ED Notes (Signed)
Report called to [employer], receiving nurse at [facility], 8th floor bed 6

## 2018-11-10 NOTE — ED Provider Notes (Signed)
[facility] EMERGENCY DEPARTMENT Provider Note   CSN: [id number] Arrival date & time: 11/10/18  0138     History   Chief Complaint No chief complaint on file.   HPI Tyler Willis. is a 68 y.o. male.     The history is provided by the patient and the spouse.  Fever Max temp prior to arrival:  104.1 Temp source:  Oral Severity:  Severe Onset quality:  Gradual Timing:  Intermittent Progression:  Worsening Chronicity:  Recurrent Relieved by:  Acetaminophen Worsened by:  Nothing Ineffective treatments:  None tried Associated symptoms: chills   Associated symptoms: no chest pain, no confusion, no congestion, no cough, no diarrhea, no dysuria, no ear pain, no headaches, no myalgias, no nausea, no rash, no rhinorrhea, no somnolence, no sore throat and no vomiting   Associated symptoms comment:  Rigors and global weakness Risk factors: hx of cancer   Patient with Multiple Myeloma not in remission on oral cytoxan and NINLARO presents with fever daily for several weeks. Patient's oncologist is aware but fevers have been low grade.  Then tonight had fever to 104.1 and shaking chills and global weakness.    Past Medical History:  Diagnosis Date  . Bilateral calf pain 07/02/2014  . Cancer [facility]) 2004   testicular  . Cough 07/02/2014  . Diabetes mellitus without complication ([place])   . GERD (gastroesophageal reflux disease)   . Multiple myeloma ([place]) 02/19/2014  . Multiple myeloma [facility])     Patient Active Problem List   Diagnosis Date Noted  . Weight gain 03/24/2016  . Esophageal candidiasis ([place]) 03/24/2016  . 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 Physical deconditioning 01/08/2016  . Hypercalcemia 01/08/2016  . History of hypokalemia 01/08/2016  . Leukocytosis 01/07/2016  . Elevated serum creatinine 07/18/2015  . Varicose veins of right lower extremity with pain 07/06/2015  . Drug-induced skin rash 05/30/2015  . Poor dentition 05/30/2015  . Chronic back  pain 05/30/2015  . 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 Cough 07/02/2014  . Anemia in chronic illness 05/25/2014  . Weakness generalized 05/25/2014  . 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 Diarrhea 04/21/2014  . Anemia of chronic disease   . Hypotension 03/22/2014  . HLD (hyperlipidemia) 03/09/2014  . GERD (gastroesophageal reflux disease) 03/09/2014  . Insomnia 03/06/2014  . Bleeding hemorrhoid 03/05/2014  . 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 Coronary artery calcification seen on CAT scan 02/22/2014  . Diabetes mellitus without complication ([place]) 42/68/3419  . 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 Lesion of mandible 02/22/2014  . Multiple myeloma in remission ([place]) 02/19/2014  . Diverticulosis 02/06/2014  . Lytic bone lesions on xray 02/06/2014    Past Surgical History:  Procedure Laterality Date  . ORIF ULNAR FRACTURE    . sternum biopsy     for dx of multiple myeloma  . TESTICLE REMOVAL Right 2004        Home Medications    Prior to Admission medications   Medication Sig Start Date End Date Taking? Authorizing Provider  acyclovir (ZOVIRAX) 800 MG tablet TK 1 T PO BID 04/03/15   [provider]  aspirin EC 81 MG tablet Take by mouth.    [provider]  calcium carbonate (CALCIUM 600) 600 MG TABS tablet Take by mouth.    [provider]  Cholecalciferol (VITAMIN D3) 2000 UNITS TABS Take 1 tablet by mouth daily.     [provider]  dexamethasone (DECADRON) 4 MG tablet Take 40  mg by mouth once a week. Mondays    [provider]  folic acid (FOLVITE) 1 MG tablet TAKE 1 TABLET BY MOUTH EVERY DAY 10/04/14   Heath Lark, MD  insulin glargine (LANTUS) 100 UNIT/ML injection Inject 5-6 Units into the skin at bedtime. Reported on 07/04/2015    [provider]  ixazomib citrate (NINLARO) 4 MG capsule Take 4 mg by mouth once a week. Take on an empty stomach 1hr before or 2hrs after food. Do not crush,  chew or open.  Once a week on Mondays x 3 weeks on and 1 week off    [provider]  levofloxacin (LEVAQUIN) 750 MG tablet Take 1 tablet (750 mg total) by mouth daily. 07/15/17   Fredia Sorrow, MD  levothyroxine (SYNTHROID, LEVOTHROID) 25 MCG tablet  01/01/16   [provider]  metFORMIN (GLUCOPHAGE) 500 MG tablet Take 500 mg by mouth 2 (two) times daily with a meal. Reported on 07/04/2015    [provider]  Multiple Vitamin (MULTIVITAMIN) tablet Take 1 tablet by mouth daily.    [provider]  oseltamivir (TAMIFLU) 75 MG capsule Take 1 capsule (75 mg total) by mouth every 12 (twelve) hours. 04/23/18   Orpah Greek, MD  pantoprazole (PROTONIX) 40 MG tablet Take 40 mg by mouth. 01/16/15   [provider]  potassium chloride (MICRO-K) 10 MEQ CR capsule Take 10 mEq by mouth 2 (two) times daily.  01/27/15   [provider]  pravastatin (PRAVACHOL) 40 MG tablet Reported on 07/04/2015 03/23/15   [provider]  prochlorperazine (COMPAZINE) 10 MG tablet Take 10 mg by mouth. Reported on 07/04/2015 01/04/15   [provider]  rosuvastatin (CRESTOR) 5 MG tablet  01/03/16   [provider]    Family History Family History  Problem Relation Age of Onset  . Diabetes Father   . Cancer Maternal Uncle        lung cancer  . Cancer Cousin        myeloma    Social History Social History   Tobacco Use  . Smoking status: Former Smoker    Types: Pipe    Quit date: 05/24/1985    Years since quitting: 33.4  . Smokeless tobacco: Never Used  Substance Use Topics  . Alcohol use: No    Alcohol/week: 0.0 standard drinks  . Drug use: No     Allergies   Velcade [bortezomib]   Review of Systems Review of Systems  Constitutional: Positive for chills, fatigue and fever.  HENT: Negative for congestion, ear pain, rhinorrhea and sore throat.   Eyes: Negative for photophobia and visual disturbance.  Respiratory:  Negative for cough.   Cardiovascular: Negative for chest pain.  Gastrointestinal: Negative for diarrhea, nausea and vomiting.  Genitourinary: Positive for difficulty urinating. Negative for dysuria.  Musculoskeletal: Negative for myalgias.  Skin: Negative for rash.  Neurological: Negative for headaches.  Psychiatric/Behavioral: Negative for confusion.  All other systems reviewed and are negative.    Physical Exam Updated Vital Signs BP (!) 95/59   Pulse 74   Resp 20   SpO2 98%   Physical Exam Vitals signs and nursing note reviewed.  Constitutional:      General: He is not in acute distress.    Appearance: He is normal weight.  HENT:     Head: Normocephalic and atraumatic.     Nose: Nose normal.     Mouth/Throat:     Mouth: Mucous membranes are moist.  Pharynx: Oropharynx is clear.  Eyes:     Conjunctiva/sclera: Conjunctivae normal.     Pupils: Pupils are equal, round, and reactive to light.  Neck:     Musculoskeletal: Normal range of motion and neck supple.  Cardiovascular:     Rate and Rhythm: Normal rate and regular rhythm.     Pulses: Normal pulses.     Heart sounds: Normal heart sounds.  Pulmonary:     Effort: Pulmonary effort is normal. No respiratory distress.     Breath sounds: Normal breath sounds. No wheezing or rales.  Abdominal:     General: Abdomen is flat. Bowel sounds are normal.     Tenderness: There is no abdominal tenderness. There is no guarding or rebound.  Musculoskeletal: Normal range of motion.        General: No swelling or tenderness.  Skin:    General: Skin is warm and dry.     Capillary Refill: Capillary refill takes less than 2 seconds.     Coloration: Skin is not jaundiced or pale.  Neurological:     General: No focal deficit present.     Mental Status: He is alert and oriented to person, place, and time.     Deep Tendon Reflexes: Reflexes normal.  Psychiatric:        Mood and Affect: Mood normal.        Behavior: Behavior  normal.      ED Treatments / Results  Labs (all labs ordered are listed, but only abnormal results are displayed) Labs Reviewed - No data to display  EKG  Date: 11/10/2018  Rate: 74  Rhythm: normal sinus rhythm  QRS Axis: normal  Intervals: normal  ST/T Wave abnormalities: normal  Conduction Disutrbances: none  Narrative Interpretation: unremarkable    Radiology CXR negative CT abdomen and pelvis negative  Procedures Procedures (including critical care time)  Medications Ordered in ED Vancomycin Cefepime Flagyl 30 cc/kg Bolus  ED sepsis initiated immediately upon arrival during epic down time on paper.  See down time documentation.  Urine is negative. COVID is negative.  Chest Xray and CT abdomen and pelvis (non contrast due to renal insufficiency) are negative.  Patient initially had a BP in the 80s but was mentating appropriately.  BP increased to 117/84 following NSS bolus.   MDM Interpretation: labs, ECG, x-ray and CT scan (normal white count 9.83 anemia 8.9 elevated lactate 2 urine is clear.  CXR negative by me.  CT negative by me.  Radiology concurs ) Total time providing critical care: 75-105 minutes. This excludes time spent performing separately reportable procedures and services. Consults: critical care (oncology at [facility]  CRITICAL CARE Performed by: Cathe Bilger K Shyla Gayheart-Rasch Total critical care time: 75 minutes Critical care time was exclusive of separately billable procedures and treating other patients. Critical care was necessary to treat or prevent imminent or life-threatening deterioration. Critical care was time spent personally by me on the following activities: development of treatment plan with patient and/or surrogate as well as nursing, discussions with consultants, evaluation of patient's response to treatment, examination of patient, obtaining history from patient or surrogate, ordering and performing treatments and interventions, ordering and review  of laboratory studies, ordering and review of radiographic studies, pulse oximetry and re-evaluation of patient's condition.   Final Clinical Impressions(s) / ED Diagnoses   Final diagnoses:  Sepsis with acute renal failure, due to unspecified organism, unspecified acute renal failure type, unspecified whether septic shock present [facility])   Case d/w Dr. Linus Orn who  declines transfer due to complexity and lack of beds.    Case d/w Drs.  Rosaria Ferries and [place] of ICU who agree to accept the patient in transfer.  EMTALA is complete.     Stevenson Windmiller, MD 11/10/18 [phone]

## 2018-11-11 LAB — URINE CULTURE: Culture: NO GROWTH

## 2018-11-15 LAB — CULTURE, BLOOD (ROUTINE X 2)
Culture: NO GROWTH
Culture: NO GROWTH
Special Requests: ADEQUATE
Special Requests: ADEQUATE

## 2018-12-05 ENCOUNTER — Emergency Department (HOSPITAL_BASED_OUTPATIENT_CLINIC_OR_DEPARTMENT_OTHER)
Admission: EM | Admit: 2018-12-05 | Discharge: 2018-12-05 | Disposition: A | Discharge status: Home / Self Care | Payer: BC Managed Care – PPO | Attending: Emergency Medicine | Admitting: Emergency Medicine

## 2018-12-05 ENCOUNTER — Other Ambulatory Visit: Payer: Self-pay

## 2018-12-05 ENCOUNTER — Emergency Department (HOSPITAL_BASED_OUTPATIENT_CLINIC_OR_DEPARTMENT_OTHER): Payer: BC Managed Care – PPO

## 2018-12-05 ENCOUNTER — Encounter (HOSPITAL_BASED_OUTPATIENT_CLINIC_OR_DEPARTMENT_OTHER): Payer: Self-pay

## 2018-12-05 DIAGNOSIS — E119 Type 2 diabetes mellitus without complications: Secondary | ICD-10-CM | POA: Insufficient documentation

## 2018-12-05 DIAGNOSIS — R509 Fever, unspecified: Secondary | ICD-10-CM | POA: Diagnosis not present

## 2018-12-05 DIAGNOSIS — C9000 Multiple myeloma not having achieved remission: Secondary | ICD-10-CM | POA: Diagnosis not present

## 2018-12-05 DIAGNOSIS — Z794 Long term (current) use of insulin: Secondary | ICD-10-CM | POA: Insufficient documentation

## 2018-12-05 DIAGNOSIS — Z20828 Contact with and (suspected) exposure to other viral communicable diseases: Secondary | ICD-10-CM | POA: Insufficient documentation

## 2018-12-05 DIAGNOSIS — E860 Dehydration: Secondary | ICD-10-CM | POA: Diagnosis not present

## 2018-12-05 DIAGNOSIS — Z79899 Other long term (current) drug therapy: Secondary | ICD-10-CM | POA: Diagnosis not present

## 2018-12-05 DIAGNOSIS — R531 Weakness: Secondary | ICD-10-CM | POA: Diagnosis present

## 2018-12-05 DIAGNOSIS — Z87891 Personal history of nicotine dependence: Secondary | ICD-10-CM | POA: Insufficient documentation

## 2018-12-05 LAB — URINALYSIS, ROUTINE W REFLEX MICROSCOPIC
Bilirubin Urine: NEGATIVE
Glucose, UA: >=500 mg/dL — AB
Ketones, ur: NEGATIVE mg/dL
Leukocytes,Ua: NEGATIVE
Nitrite: NEGATIVE
Protein, ur: 30 mg/dL — AB
Specific Gravity, Urine: 1.01 (ref 1.005–1.030)
pH: 7 (ref 5.0–8.0)

## 2018-12-05 LAB — URINALYSIS, MICROSCOPIC (REFLEX): Bacteria, UA: NONE SEEN

## 2018-12-05 LAB — LACTIC ACID, PLASMA
Lactic Acid, Venous: 1.9 mmol/L (ref 0.5–1.9)
Lactic Acid, Venous: 1.9 mmol/L (ref 0.5–1.9)

## 2018-12-05 LAB — CBG MONITORING, ED: Glucose-Capillary: 186 mg/dL — ABNORMAL HIGH (ref 70–99)

## 2018-12-05 LAB — SARS CORONAVIRUS 2 BY RT PCR (HOSPITAL ORDER, PERFORMED IN ~~LOC~~ HOSPITAL LAB): SARS Coronavirus 2: NEGATIVE

## 2018-12-05 MED ORDER — LACTATED RINGERS IV BOLUS
1000.0000 mL | Freq: Once | INTRAVENOUS | Status: AC
  Administered 2018-12-05: 18:00:00 1000 mL via INTRAVENOUS

## 2018-12-05 MED ORDER — LACTATED RINGERS IV BOLUS
1000.0000 mL | Freq: Once | INTRAVENOUS | Status: AC
  Administered 2018-12-05: 22:00:00 1000 mL via INTRAVENOUS

## 2018-12-05 NOTE — ED Notes (Signed)
Pt and wife notified of wait to be seen-wife appears angry=refised to allow me to transport pt via w/c-states "I will do it"

## 2018-12-05 NOTE — ED Triage Notes (Signed)
Per wife pt fell this am and fell again this afternoon-pt with increase in weakness-pt is cancer pt with current chemo-states pt with fever 101.8 PTA-tylenol x 2 PTA-pt to triage in w/c-NAD

## 2018-12-05 NOTE — ED Notes (Signed)
ED Provider at bedside. 

## 2018-12-05 NOTE — ED Provider Notes (Signed)
[facility] EMERGENCY DEPARTMENT Provider Note   CSN: [id number] Arrival date & time: 12/05/18  1620     History   Chief Complaint Chief Complaint  Patient presents with  . Fall  . Fever    HPI [employer] is a 68 y.o. male.     HPI Patient presents after weakness and fall.  Has multiple myeloma and currently getting chemotherapy.  Recently had a new treatment.  Had been on steroids.  Finished that up a few days ago.  However has been more weak.  Had a fever of 101.8 today.  However patient had fevers on and off for the last few months.  Has had extensive work-up at [facility] with reportedly thought that it is the cause from the cancer itself.  Today he was feeling weak and fell.  Legs were overall weaker.  Landed on his buttock with some pain in that area.  No abdominal pain.  No chest pain.  Had blood work drawn at [facility] earlier today. Past Medical History:  Diagnosis Date  . Bilateral calf pain 07/02/2014  . Cancer [facility]) 2004   testicular  . Cough 07/02/2014  . Diabetes mellitus without complication ([place])   . GERD (gastroesophageal reflux disease)   . Multiple myeloma ([place]) 02/19/2014  . Multiple myeloma [facility])     Patient Active Problem List   Diagnosis Date Noted  . Weight gain 03/24/2016  . Esophageal candidiasis ([place]) 03/24/2016  . 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 Physical deconditioning 01/08/2016  . Hypercalcemia 01/08/2016  . History of hypokalemia 01/08/2016  . Leukocytosis 01/07/2016  . Elevated serum creatinine 07/18/2015  . Varicose veins of right lower extremity with pain 07/06/2015  . Drug-induced skin rash 05/30/2015  . Poor dentition 05/30/2015  . Chronic back pain 05/30/2015  . 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 Cough 07/02/2014  . Anemia in chronic illness 05/25/2014  . Weakness generalized 05/25/2014  . 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 Diarrhea 04/21/2014  . Anemia of chronic disease   .  Hypotension 03/22/2014  . HLD (hyperlipidemia) 03/09/2014  . GERD (gastroesophageal reflux disease) 03/09/2014  . Insomnia 03/06/2014  . Bleeding hemorrhoid 03/05/2014  . 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 Coronary artery calcification seen on CAT scan 02/22/2014  . Diabetes mellitus without complication ([place]) 36/62/9476  . 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 Lesion of mandible 02/22/2014  . Multiple myeloma in remission ([place]) 02/19/2014  . Diverticulosis 02/06/2014  . Lytic bone lesions on xray 02/06/2014    Past Surgical History:  Procedure Laterality Date  . ORIF ULNAR FRACTURE    . sternum biopsy     for dx of multiple myeloma  . TESTICLE REMOVAL Right 2004        Home Medications    Prior to Admission medications   Medication Sig Start Date End Date Taking? Authorizing Provider  acyclovir (ZOVIRAX) 800 MG tablet TK 1 T PO BID 04/03/15   [provider]  aspirin EC 81 MG tablet Take by mouth.    [provider]  calcium carbonate (CALCIUM 600) 600 MG TABS tablet Take by mouth.    [provider]  Cholecalciferol (VITAMIN D3) 2000 UNITS TABS Take 1 tablet by mouth daily.     [provider]  dexamethasone (DECADRON) 4 MG tablet Take 40 mg by mouth once a week. Mondays    [provider]  folic acid (FOLVITE) 1 MG tablet TAKE 1 TABLET BY MOUTH EVERY DAY  10/04/14   Heath Lark, MD  insulin glargine (LANTUS) 100 UNIT/ML injection Inject 5-6 Units into the skin at bedtime. Reported on 07/04/2015    [provider]  ixazomib citrate (NINLARO) 4 MG capsule Take 4 mg by mouth once a week. Take on an empty stomach 1hr before or 2hrs after food. Do not crush, chew or open.  Once a week on Mondays x 3 weeks on and 1 week off    [provider]  levofloxacin (LEVAQUIN) 750 MG tablet Take 1 tablet (750 mg total) by mouth daily. 07/15/17   Fredia Sorrow, MD  levothyroxine (SYNTHROID, LEVOTHROID) 25 MCG tablet   01/01/16   [provider]  metFORMIN (GLUCOPHAGE) 500 MG tablet Take 500 mg by mouth 2 (two) times daily with a meal. Reported on 07/04/2015    [provider]  Multiple Vitamin (MULTIVITAMIN) tablet Take 1 tablet by mouth daily.    [provider]  oseltamivir (TAMIFLU) 75 MG capsule Take 1 capsule (75 mg total) by mouth every 12 (twelve) hours. 04/23/18   Orpah Greek, MD  pantoprazole (PROTONIX) 40 MG tablet Take 40 mg by mouth. 01/16/15   [provider]  potassium chloride (MICRO-K) 10 MEQ CR capsule Take 10 mEq by mouth 2 (two) times daily.  01/27/15   [provider]  pravastatin (PRAVACHOL) 40 MG tablet Reported on 07/04/2015 03/23/15   [provider]  prochlorperazine (COMPAZINE) 10 MG tablet Take 10 mg by mouth. Reported on 07/04/2015 01/04/15   [provider]  rosuvastatin (CRESTOR) 5 MG tablet  01/03/16   [provider]    Family History Family History  Problem Relation Age of Onset  . Diabetes Father   . Cancer Maternal Uncle        lung cancer  . Cancer Cousin        myeloma    Social History Social History   Tobacco Use  . Smoking status: Former Smoker    Types: Pipe    Quit date: 05/24/1985    Years since quitting: 33.5  . Smokeless tobacco: Never Used  Substance Use Topics  . Alcohol use: No    Alcohol/week: 0.0 standard drinks  . Drug use: No     Allergies   Velcade [bortezomib]   Review of Systems Review of Systems  Constitutional: Positive for fever. Negative for appetite change.  Respiratory: Negative for cough and shortness of breath.   Cardiovascular: Negative for chest pain.  Gastrointestinal: Negative for abdominal pain.  Genitourinary: Negative for flank pain.  Musculoskeletal: Negative for back pain.       Buttock pain.  Neurological: Positive for weakness.  Psychiatric/Behavioral: Negative for confusion.     Physical Exam Updated Vital Signs BP 109/66    Pulse 85   Temp 98.7 F (37.1 C) (Oral)   Resp 18   SpO2 99%   Physical Exam Vitals signs and nursing note reviewed.  HENT:     Head: Normocephalic.     Mouth/Throat:     Mouth: Mucous membranes are moist.  Neck:     Musculoskeletal: Neck supple.  Cardiovascular:     Comments: Mild tachycardia Pulmonary:     Breath sounds: No wheezing, rhonchi or rales.  Abdominal:     Tenderness: There is no abdominal tenderness.  Musculoskeletal:     Comments: Mild tenderness over coccyx.  Skin:    General: Skin is warm.     Capillary Refill: Capillary refill takes less than 2 seconds.  Neurological:     Mental Status: He is alert and oriented to person, place, and time.      ED Treatments / Results  Labs (all labs ordered are listed, but only abnormal results are displayed) Labs Reviewed  URINALYSIS, ROUTINE W REFLEX MICROSCOPIC - Abnormal; Notable for the following components:      Result Value   Glucose, UA >=500 (*)    Hgb urine dipstick TRACE (*)    Protein, ur 30 (*)    All other components within normal limits  CBG MONITORING, ED - Abnormal; Notable for the following components:   Glucose-Capillary 186 (*)    All other components within normal limits  SARS CORONAVIRUS 2 (HOSPITAL ORDER, [place] [facility])  CULTURE, BLOOD (ROUTINE X 2)  CULTURE, BLOOD (ROUTINE X 2)  URINE CULTURE  LACTIC ACID, PLASMA  LACTIC ACID, PLASMA  URINALYSIS, MICROSCOPIC (REFLEX)          EKG None  Radiology Dg Sacrum/coccyx  Result Date: 12/05/2018 CLINICAL DATA:  Per wife pt fell this am and fell again this afternoon-pt with increase in weakness-pt is cancer pt with current chemo-states pt with fever 101.[place] multiple myeloma, testicular ca, right testicle removed EXAM: PORTABLE CHEST 1 VIEW COMPARISON:  Prior exams, most recent dated 11/10/2018 FINDINGS: Cardiac silhouette is normal in size. No mediastinal or hilar masses. No evidence of adenopathy. Clear lungs.   No pleural effusion or pneumothorax. Skeletal structures are demineralized but grossly intact. IMPRESSION: No active disease. Electronically Signed   By: Lajean Manes M.D.   On: 12/05/2018 18:54   Dg Chest Portable 1 View  Result Date: 12/05/2018 CLINICAL DATA:  Per wife pt fell this am and fell again this afternoon-pt with increase in weakness-pt is cancer pt with current chemo-states pt with fever 101.[place] multiple myeloma, testicular ca, right testicle removed EXAM: PORTABLE CHEST 1 VIEW COMPARISON:  Prior exams, most recent dated 11/10/2018 FINDINGS: Cardiac silhouette is normal in size. No mediastinal or hilar masses. No evidence of adenopathy. Clear lungs.  No pleural effusion or pneumothorax. Skeletal structures are demineralized but grossly intact. IMPRESSION: No active disease. Electronically Signed   By: Lajean Manes M.D.   On: 12/05/2018 18:54    Procedures Procedures (including critical care time)  Medications Ordered in ED Medications  lactated ringers bolus 1,000 mL ( Intravenous Stopped 12/05/18 1940)  lactated ringers bolus 1,000 mL ( Intravenous Stopped 12/05/18 2315)     Initial Impression / Assessment and Plan / ED Course  I have reviewed the triage vital signs and the nursing notes.  Pertinent labs & imaging results that were available during my care of the patient were reviewed by me and considered in my medical decision making (see chart for details).        Patient presents with hypotension.  Generalized weakness.  Feels much better after IV fluids.  Labs overall reassuring.  Labs reviewed from being drawn earlier today.  Has had a fever at home but apparently is had fevers on and off since November with extensive work-up.  Thought to be either fever unknown origin versus fever secondary to cancer.  Urine does not show infection.  Chest does not show infection.  Patient's been eating and drinking less.  Blood culture sent.  Lactic acid reassuring x2.  Discussed with  patient and his wife.  At this point we will not empirically treat for an infection.  He will call his oncologist tomorrow.  If the cultures show growth he  will be notified.  We think it is reasonable to not treat with antibiotics at this time.  Discharge home.  Final Clinical Impressions(s) / ED Diagnoses   Final diagnoses:  Dehydration  Weakness  Fever, unspecified fever cause    ED Discharge Orders    None       Davonna Belling, MD 12/05/18 2339

## 2018-12-05 NOTE — Discharge Instructions (Signed)
You appear to be somewhat dehydrated.  You have a fever but we are assuming this is the the recurrent fever you are had and not a new infection.  You will be notified if the blood cultures show growth.  Follow-up with your oncologist with a call in the morning.

## 2018-12-07 LAB — URINE CULTURE: Culture: <10000 — AB

## 2018-12-10 LAB — CULTURE, BLOOD (ROUTINE X 2)
Culture: NO GROWTH
Culture: NO GROWTH
Special Requests: ADEQUATE
Special Requests: ADEQUATE

## 2019-01-22 DEATH — deceased

## 2021-03-02 IMAGING — CT CT RENAL STONE PROTOCOL
2 of 4 series · 16 of 46 positions shown, 18 images · non-contrast
Comparison: CT abdomen pelvis dated 05/05/2018.

CLINICAL DATA: 68-year-old male presenting to the emergency
department with sepsis and renal insufficiency.

EXAM:
CT ABDOMEN AND PELVIS WITHOUT CONTRAST
TECHNIQUE: Multidetector CT imaging of the abdomen and pelvis was performed
following the standard protocol without IV contrast.

[Series 2: axial st · axial · 0.84mm/px · z∈[-488,-33]mm · 13 of 101 slices shown, 15 images]
[im 5/101  soft-tissue]
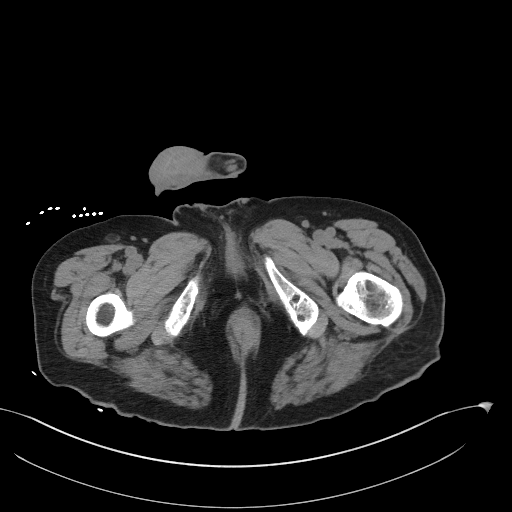
[im 5/101  bone]
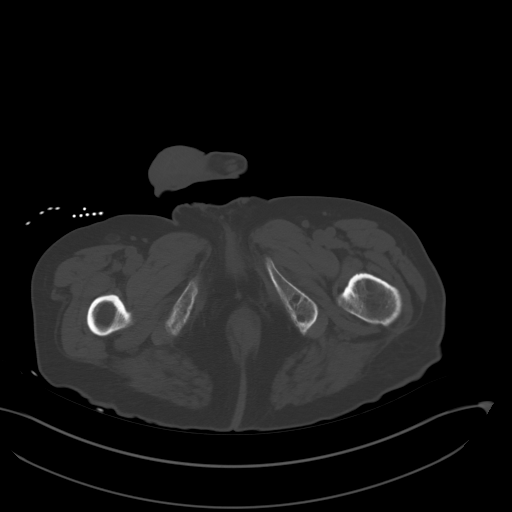
[im 15/101  soft-tissue]
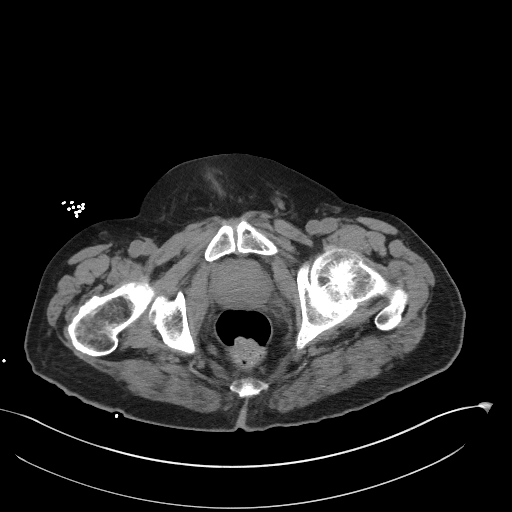
[im 20/101  soft-tissue]
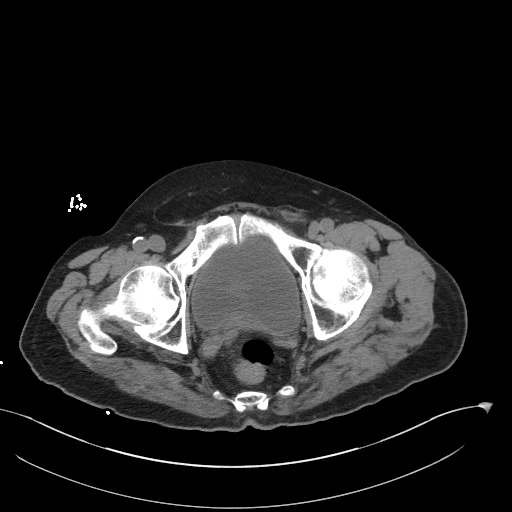
[im 29/101  soft-tissue]
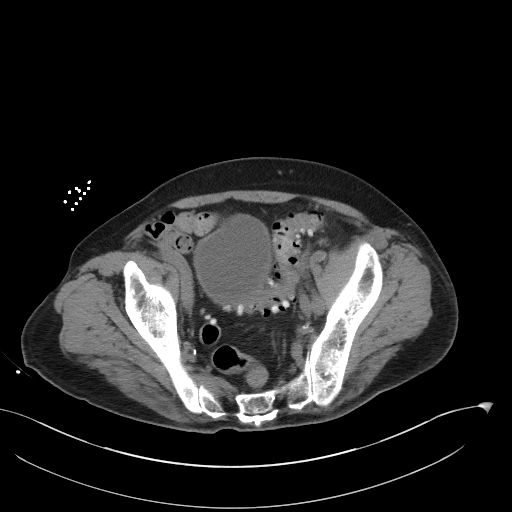
[im 34/101  soft-tissue]
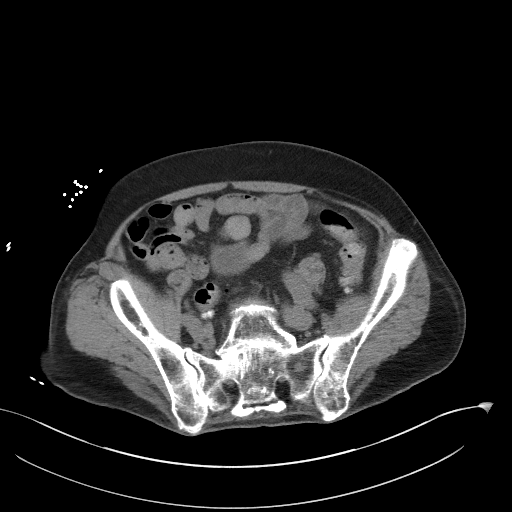
[im 43/101  soft-tissue]
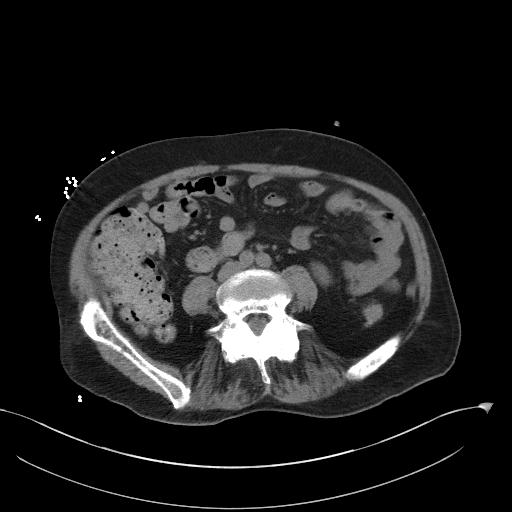
[im 53/101  soft-tissue]
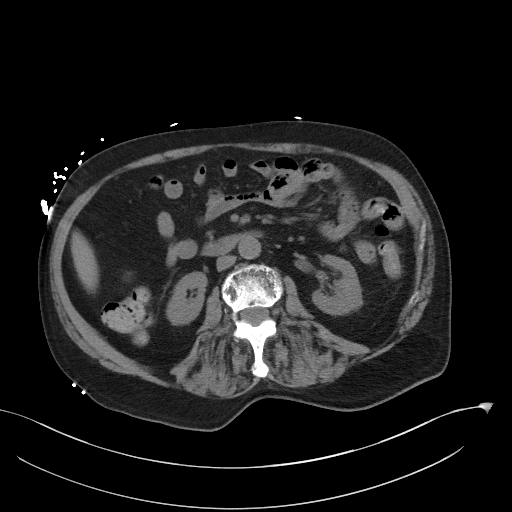
[im 58/101  soft-tissue]
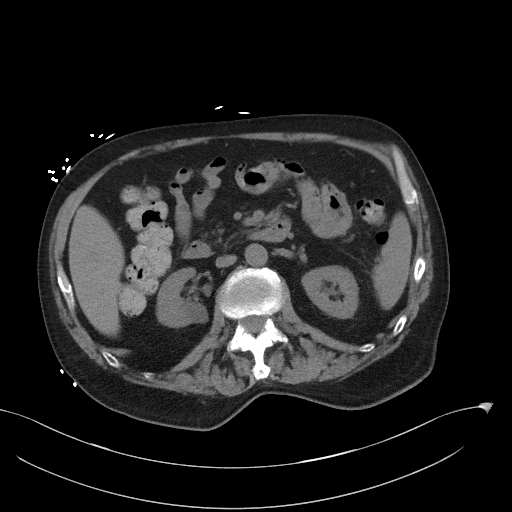
[im 67/101  soft-tissue]
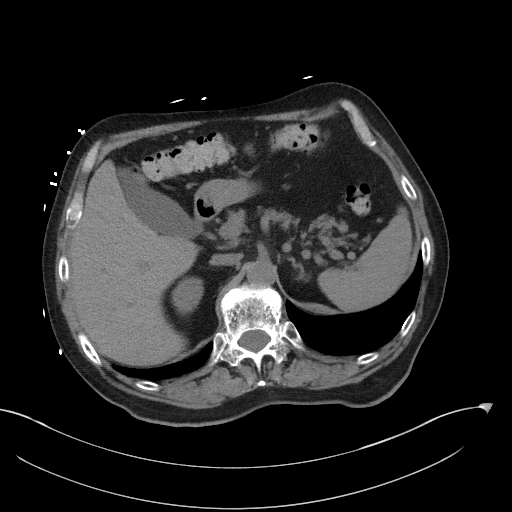
[im 67/101  bone]
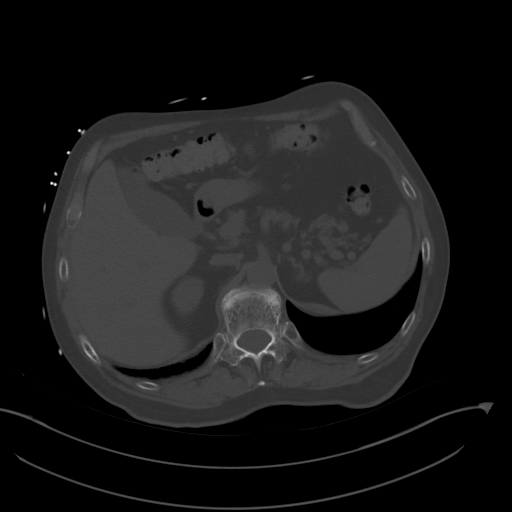
[im 72/101  soft-tissue]
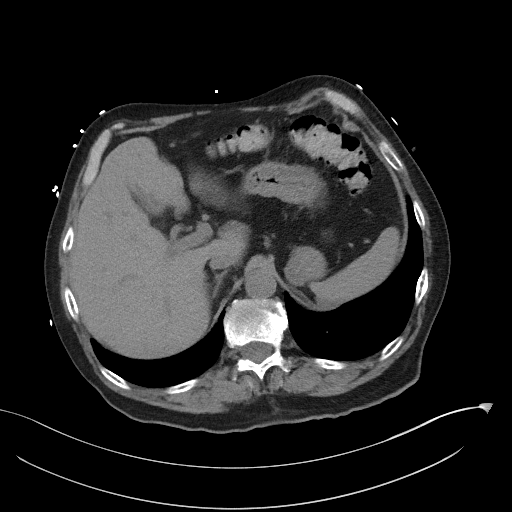
[im 81/101  soft-tissue]
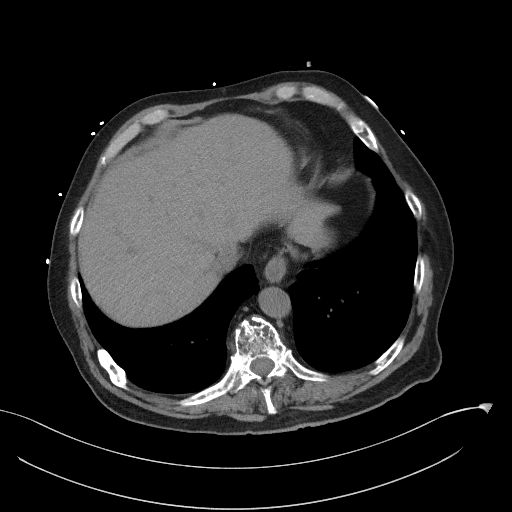
[im 86/101  soft-tissue]
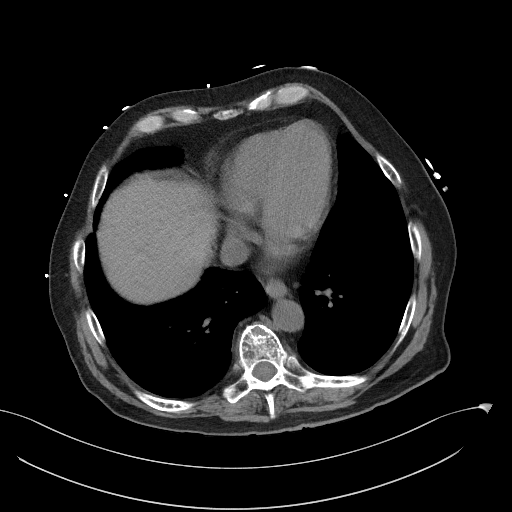
[im 96/101  soft-tissue]
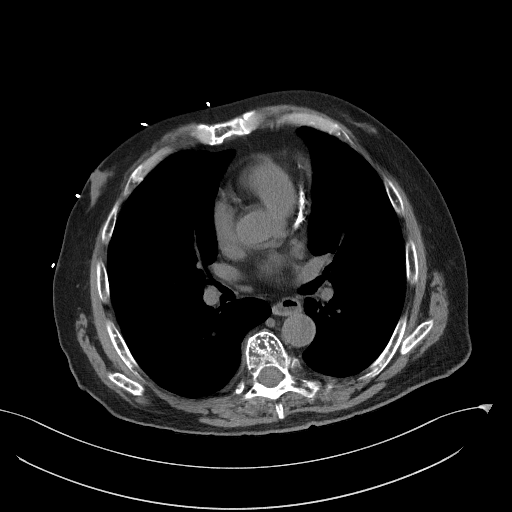

[Series 4: coronal st · coronal · 0.96mm/px · 3 of 86 slices shown]
[im 29/86  soft-tissue]
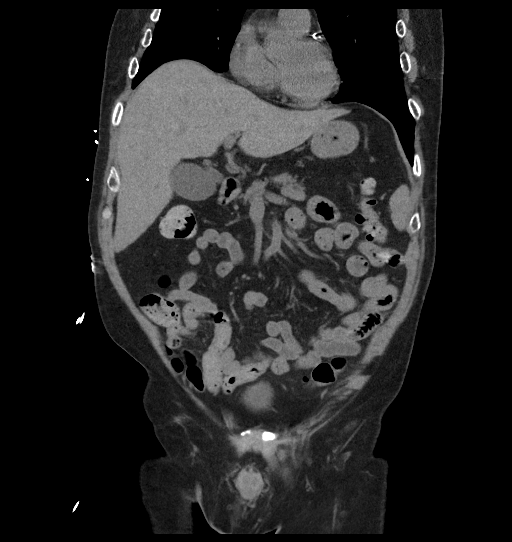
[im 38/86  soft-tissue]
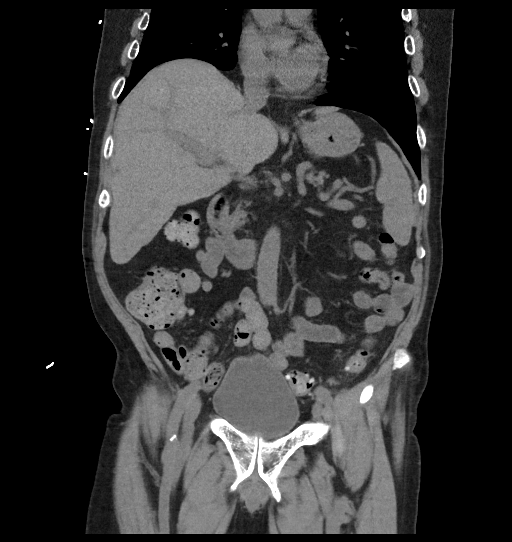
[im 48/86  soft-tissue]
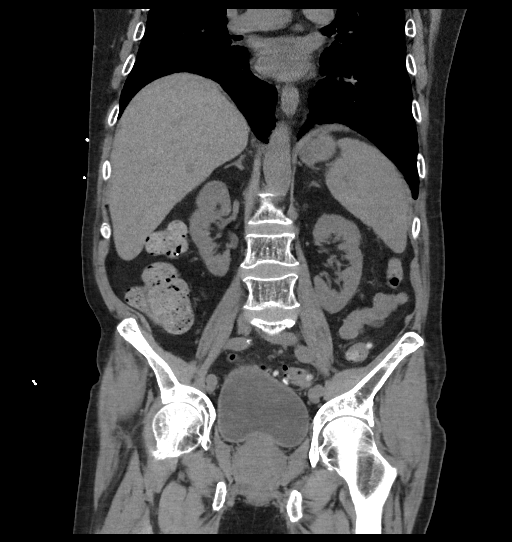

[16 of 46 positions shown; findings below may reference images not displayed]

FINDINGS: Lower chest: A 5 mm pulmonary nodule in the right middle lobe is not
significantly changed since 02/02/2014 and benign. Calcified
coronary artery atherosclerosis is identified.

Hepatobiliary: Hypoechoic areas in the right hepatic lobe measuring
up to 11 mm (series 2, images 31 and 32) are not significantly
changed since at least 02/02/2014 and consistent with benign cysts.
Additional subcentimeter probable cysts are seen elsewhere in the
right hepatic lobe. The gallbladder appears normal.

Pancreas: Unremarkable. No pancreatic ductal dilatation or
surrounding inflammatory changes.

Spleen: Normal in size without focal abnormality.

Adrenals/Urinary Tract: Adrenal glands are unremarkable. Renal cysts
on the right measure up to 2.0 cm and are not significantly changed
since at least 02/02/2014. No renal calculi, focal lesion, or
hydronephrosis are identified on either side. Bladder is
unremarkable.

Stomach/Bowel: Stomach is within normal limits. Appendix appears
normal. There is colonic diverticulosis without evidence of
diverticulitis. No evidence of bowel wall thickening, distention, or
inflammatory changes.

Vascular/Lymphatic: A replaced left hepatic artery and a replaced
right hepatic artery arises from the superior mesenteric artery. No
enlarged abdominal or pelvic lymph nodes.

Reproductive: Prostate is unremarkable.

Other: No abdominal wall hernia or abnormality. No abdominopelvic
ascites.

Musculoskeletal: A lytic lesion in the right aspect of the L2
vertebral body has not significantly changed since 3688. Multiple
bilateral chronic rib fractures are noted. Degenerative changes are
noted in the spine, including grade 2 anterolisthesis of L5 on S1.
IMPRESSION: 1.  No acute process in the abdomen or pelvis.

2.  Colonic diverticulosis without evidence of diverticulitis.
